# Patient Record
Sex: Female | Born: 1960 | Race: White | Hispanic: No | Marital: Married | Smoking: Former smoker
Health system: Southern US, Community
[De-identification: ages and names within clinical notes are randomized; demographics above are authoritative.]

## PROBLEM LIST (undated history)

## (undated) DIAGNOSIS — Z803 Family history of malignant neoplasm of breast: Secondary | ICD-10-CM

## (undated) HISTORY — PX: APPENDECTOMY: SHX54

## (undated) HISTORY — DX: Family history of malignant neoplasm of breast: Z80.3

---

## 2007-11-28 ENCOUNTER — Other Ambulatory Visit: Admission: RE | Admit: 2007-11-28 | Discharge: 2007-11-28 | Payer: Self-pay | Admitting: Family Medicine

## 2008-07-26 ENCOUNTER — Encounter: Admission: RE | Admit: 2008-07-26 | Discharge: 2008-07-26 | Payer: Self-pay | Admitting: Family Medicine

## 2010-08-22 ENCOUNTER — Other Ambulatory Visit: Payer: Self-pay | Admitting: Nurse Practitioner

## 2010-08-22 DIAGNOSIS — Z1231 Encounter for screening mammogram for malignant neoplasm of breast: Secondary | ICD-10-CM

## 2010-09-02 ENCOUNTER — Ambulatory Visit
Admission: RE | Admit: 2010-09-02 | Discharge: 2010-09-02 | Disposition: A | Discharge status: Home / Self Care | Payer: Private Health Insurance - Indemnity | Source: Ambulatory Visit | Attending: Nurse Practitioner | Admitting: Nurse Practitioner

## 2010-09-02 DIAGNOSIS — Z1231 Encounter for screening mammogram for malignant neoplasm of breast: Secondary | ICD-10-CM

## 2010-12-30 ENCOUNTER — Other Ambulatory Visit (HOSPITAL_COMMUNITY)
Admission: RE | Admit: 2010-12-30 | Discharge: 2010-12-30 | Disposition: A | Discharge status: Home / Self Care | Payer: Private Health Insurance - Indemnity | Source: Ambulatory Visit | Attending: Obstetrics and Gynecology | Admitting: Obstetrics and Gynecology

## 2010-12-30 DIAGNOSIS — Z01419 Encounter for gynecological examination (general) (routine) without abnormal findings: Secondary | ICD-10-CM | POA: Insufficient documentation

## 2011-12-16 ENCOUNTER — Other Ambulatory Visit: Payer: Self-pay | Admitting: Obstetrics and Gynecology

## 2011-12-16 DIAGNOSIS — Z1231 Encounter for screening mammogram for malignant neoplasm of breast: Secondary | ICD-10-CM

## 2012-01-26 ENCOUNTER — Ambulatory Visit
Admission: RE | Admit: 2012-01-26 | Discharge: 2012-01-26 | Disposition: A | Discharge status: Home / Self Care | Payer: Private Health Insurance - Indemnity | Source: Ambulatory Visit | Attending: Obstetrics and Gynecology | Admitting: Obstetrics and Gynecology

## 2012-01-26 DIAGNOSIS — Z1231 Encounter for screening mammogram for malignant neoplasm of breast: Secondary | ICD-10-CM

## 2012-03-03 ENCOUNTER — Other Ambulatory Visit: Payer: Self-pay | Admitting: Obstetrics and Gynecology

## 2012-03-03 ENCOUNTER — Other Ambulatory Visit (HOSPITAL_COMMUNITY)
Admission: RE | Admit: 2012-03-03 | Discharge: 2012-03-03 | Disposition: A | Discharge status: Home / Self Care | Payer: Private Health Insurance - Indemnity | Source: Ambulatory Visit | Attending: Obstetrics and Gynecology | Admitting: Obstetrics and Gynecology

## 2012-03-03 DIAGNOSIS — Z1151 Encounter for screening for human papillomavirus (HPV): Secondary | ICD-10-CM | POA: Insufficient documentation

## 2012-03-03 DIAGNOSIS — Z01419 Encounter for gynecological examination (general) (routine) without abnormal findings: Secondary | ICD-10-CM | POA: Insufficient documentation

## 2012-12-23 ENCOUNTER — Other Ambulatory Visit: Payer: Self-pay | Admitting: Gastroenterology

## 2012-12-23 DIAGNOSIS — R1013 Epigastric pain: Secondary | ICD-10-CM

## 2012-12-23 DIAGNOSIS — R110 Nausea: Secondary | ICD-10-CM

## 2013-01-12 ENCOUNTER — Encounter (HOSPITAL_COMMUNITY)
Admission: RE | Admit: 2013-01-12 | Discharge: 2013-01-12 | Disposition: A | Discharge status: Home / Self Care | Payer: Private Health Insurance - Indemnity | Source: Ambulatory Visit | Attending: Gastroenterology | Admitting: Gastroenterology

## 2013-01-12 ENCOUNTER — Ambulatory Visit (HOSPITAL_COMMUNITY)
Admission: RE | Admit: 2013-01-12 | Discharge: 2013-01-12 | Disposition: A | Discharge status: Home / Self Care | Payer: Private Health Insurance - Indemnity | Source: Ambulatory Visit | Attending: Gastroenterology | Admitting: Gastroenterology

## 2013-01-12 DIAGNOSIS — R1013 Epigastric pain: Secondary | ICD-10-CM | POA: Insufficient documentation

## 2013-01-12 DIAGNOSIS — R110 Nausea: Secondary | ICD-10-CM | POA: Insufficient documentation

## 2013-01-12 MED ORDER — TECHNETIUM TC 99M MEBROFENIN IV KIT
5.0000 | PACK | Freq: Once | INTRAVENOUS | Status: AC | PRN
  Administered 2013-01-12: 5 via INTRAVENOUS

## 2013-03-17 ENCOUNTER — Other Ambulatory Visit: Payer: Self-pay

## 2013-03-17 DIAGNOSIS — Z1231 Encounter for screening mammogram for malignant neoplasm of breast: Secondary | ICD-10-CM

## 2013-03-24 ENCOUNTER — Other Ambulatory Visit (HOSPITAL_COMMUNITY)
Admission: RE | Admit: 2013-03-24 | Discharge: 2013-03-24 | Disposition: A | Discharge status: Home / Self Care | Payer: Managed Care, Other (non HMO) | Source: Ambulatory Visit | Attending: Obstetrics and Gynecology | Admitting: Obstetrics and Gynecology

## 2013-03-24 ENCOUNTER — Other Ambulatory Visit: Payer: Self-pay | Admitting: Obstetrics and Gynecology

## 2013-03-24 DIAGNOSIS — Z01419 Encounter for gynecological examination (general) (routine) without abnormal findings: Secondary | ICD-10-CM | POA: Insufficient documentation

## 2013-03-31 ENCOUNTER — Ambulatory Visit
Admission: RE | Admit: 2013-03-31 | Discharge: 2013-03-31 | Disposition: A | Discharge status: Home / Self Care | Payer: Managed Care, Other (non HMO) | Source: Ambulatory Visit

## 2013-03-31 DIAGNOSIS — Z1231 Encounter for screening mammogram for malignant neoplasm of breast: Secondary | ICD-10-CM

## 2014-03-30 ENCOUNTER — Other Ambulatory Visit: Payer: Self-pay | Admitting: Obstetrics and Gynecology

## 2014-03-30 ENCOUNTER — Other Ambulatory Visit (HOSPITAL_COMMUNITY)
Admission: RE | Admit: 2014-03-30 | Discharge: 2014-03-30 | Disposition: A | Discharge status: Home / Self Care | Payer: Managed Care, Other (non HMO) | Source: Ambulatory Visit | Attending: Obstetrics and Gynecology | Admitting: Obstetrics and Gynecology

## 2014-03-30 DIAGNOSIS — Z01419 Encounter for gynecological examination (general) (routine) without abnormal findings: Secondary | ICD-10-CM | POA: Insufficient documentation

## 2014-04-03 LAB — CYTOLOGY - PAP

## 2014-04-17 ENCOUNTER — Ambulatory Visit: Payer: Managed Care, Other (non HMO) | Admitting: Family Medicine

## 2014-07-16 ENCOUNTER — Other Ambulatory Visit: Payer: Self-pay

## 2014-07-16 DIAGNOSIS — Z1231 Encounter for screening mammogram for malignant neoplasm of breast: Secondary | ICD-10-CM

## 2014-08-10 ENCOUNTER — Ambulatory Visit: Payer: Managed Care, Other (non HMO)

## 2014-08-15 ENCOUNTER — Ambulatory Visit
Admission: RE | Admit: 2014-08-15 | Discharge: 2014-08-15 | Disposition: A | Discharge status: Home / Self Care | Payer: Managed Care, Other (non HMO) | Source: Ambulatory Visit

## 2014-08-15 DIAGNOSIS — Z1231 Encounter for screening mammogram for malignant neoplasm of breast: Secondary | ICD-10-CM

## 2015-08-05 ENCOUNTER — Ambulatory Visit
Admission: RE | Admit: 2015-08-05 | Discharge: 2015-08-05 | Disposition: A | Discharge status: Home / Self Care | Payer: Managed Care, Other (non HMO) | Source: Ambulatory Visit | Attending: Nurse Practitioner | Admitting: Nurse Practitioner

## 2015-08-05 ENCOUNTER — Emergency Department (HOSPITAL_COMMUNITY): Payer: Managed Care, Other (non HMO) | Admitting: Anesthesiology

## 2015-08-05 ENCOUNTER — Other Ambulatory Visit: Payer: Self-pay | Admitting: Nurse Practitioner

## 2015-08-05 ENCOUNTER — Observation Stay (HOSPITAL_COMMUNITY)
Admission: EM | Admit: 2015-08-05 | Discharge: 2015-08-06 | Disposition: A | Discharge status: Home / Self Care | Payer: Managed Care, Other (non HMO) | Attending: General Surgery | Admitting: General Surgery

## 2015-08-05 ENCOUNTER — Encounter (HOSPITAL_COMMUNITY): Payer: Self-pay | Admitting: Emergency Medicine

## 2015-08-05 ENCOUNTER — Encounter (HOSPITAL_COMMUNITY)
Admission: EM | Disposition: A | Discharge status: Home / Self Care | Payer: Self-pay | Source: Home / Self Care | Attending: Emergency Medicine

## 2015-08-05 DIAGNOSIS — K353 Acute appendicitis with localized peritonitis, without perforation or gangrene: Secondary | ICD-10-CM | POA: Diagnosis present

## 2015-08-05 DIAGNOSIS — Z87891 Personal history of nicotine dependence: Secondary | ICD-10-CM | POA: Diagnosis not present

## 2015-08-05 DIAGNOSIS — K5900 Constipation, unspecified: Secondary | ICD-10-CM | POA: Diagnosis not present

## 2015-08-05 DIAGNOSIS — K3580 Unspecified acute appendicitis: Secondary | ICD-10-CM | POA: Diagnosis present

## 2015-08-05 DIAGNOSIS — R1031 Right lower quadrant pain: Secondary | ICD-10-CM

## 2015-08-05 HISTORY — PX: LAPAROSCOPIC APPENDECTOMY: SHX408

## 2015-08-05 LAB — COMPREHENSIVE METABOLIC PANEL
ALK PHOS: 108 U/L (ref 38–126)
ALT: 23 U/L (ref 14–54)
ANION GAP: 10 (ref 5–15)
AST: 30 U/L (ref 15–41)
Albumin: 3.9 g/dL (ref 3.5–5.0)
BUN: 10 mg/dL (ref 6–20)
CO2: 26 mmol/L (ref 22–32)
Calcium: 9.6 mg/dL (ref 8.9–10.3)
Chloride: 98 mmol/L — ABNORMAL LOW (ref 101–111)
Creatinine, Ser: 0.9 mg/dL (ref 0.44–1.00)
Glucose, Bld: 98 mg/dL (ref 65–99)
Potassium: 4 mmol/L (ref 3.5–5.1)
SODIUM: 134 mmol/L — AB (ref 135–145)
Total Bilirubin: 1.1 mg/dL (ref 0.3–1.2)
Total Protein: 8.3 g/dL — ABNORMAL HIGH (ref 6.5–8.1)

## 2015-08-05 LAB — CBC
HEMATOCRIT: 39.5 % (ref 36.0–46.0)
Hemoglobin: 13.3 g/dL (ref 12.0–15.0)
MCH: 30.6 pg (ref 26.0–34.0)
MCHC: 33.7 g/dL (ref 30.0–36.0)
MCV: 91 fL (ref 78.0–100.0)
PLATELETS: 188 10*3/uL (ref 150–400)
RBC: 4.34 MIL/uL (ref 3.87–5.11)
RDW: 14.1 % (ref 11.5–15.5)
WBC: 13 10*3/uL — AB (ref 4.0–10.5)

## 2015-08-05 LAB — URINALYSIS, ROUTINE W REFLEX MICROSCOPIC
Bilirubin Urine: NEGATIVE
GLUCOSE, UA: NEGATIVE mg/dL
Hgb urine dipstick: NEGATIVE
Ketones, ur: 15 mg/dL — AB
Nitrite: NEGATIVE
PH: 6.5 (ref 5.0–8.0)
PROTEIN: NEGATIVE mg/dL
Specific Gravity, Urine: >1.046 — ABNORMAL HIGH (ref 1.005–1.030)

## 2015-08-05 LAB — URINE MICROSCOPIC-ADD ON

## 2015-08-05 LAB — I-STAT BETA HCG BLOOD, ED (MC, WL, AP ONLY): I-stat hCG, quantitative: <5 m[IU]/mL (ref <5)

## 2015-08-05 LAB — LIPASE, BLOOD: LIPASE: 29 U/L (ref 11–51)

## 2015-08-05 SURGERY — APPENDECTOMY, LAPAROSCOPIC
Anesthesia: General | Site: Abdomen

## 2015-08-05 MED ORDER — DIPHENHYDRAMINE HCL 50 MG/ML IJ SOLN: 25.0000 mg | Freq: Four times a day (QID) | INTRAMUSCULAR | Status: DC | PRN

## 2015-08-05 MED ORDER — MORPHINE SULFATE (PF) 2 MG/ML IV SOLN: 2.0000 mg | INTRAVENOUS | Status: DC | PRN

## 2015-08-05 MED ORDER — ROCURONIUM BROMIDE 50 MG/5ML IV SOLN
INTRAVENOUS | Status: AC
  Filled 2015-08-05: qty 1

## 2015-08-05 MED ORDER — DEXAMETHASONE SODIUM PHOSPHATE 10 MG/ML IJ SOLN
INTRAMUSCULAR | Status: AC
  Filled 2015-08-05: qty 1

## 2015-08-05 MED ORDER — ENOXAPARIN SODIUM 40 MG/0.4ML ~~LOC~~ SOLN: 40.0000 mg | SUBCUTANEOUS | Status: DC

## 2015-08-05 MED ORDER — PHENYLEPHRINE 40 MCG/ML (10ML) SYRINGE FOR IV PUSH (FOR BLOOD PRESSURE SUPPORT)
PREFILLED_SYRINGE | INTRAVENOUS | Status: AC
  Filled 2015-08-05: qty 10

## 2015-08-05 MED ORDER — SODIUM CHLORIDE 0.9 % IR SOLN
Status: DC | PRN
  Administered 2015-08-05: 1000 mL

## 2015-08-05 MED ORDER — DEXAMETHASONE SODIUM PHOSPHATE 10 MG/ML IJ SOLN
INTRAMUSCULAR | Status: DC | PRN
  Administered 2015-08-05: 10 mg via INTRAVENOUS

## 2015-08-05 MED ORDER — ONDANSETRON HCL 4 MG/2ML IJ SOLN: 4.0000 mg | Freq: Four times a day (QID) | INTRAMUSCULAR | Status: DC | PRN

## 2015-08-05 MED ORDER — SODIUM CHLORIDE 0.9 % IV BOLUS (SEPSIS)
1000.0000 mL | Freq: Once | INTRAVENOUS | Status: AC
  Administered 2015-08-05: 1000 mL via INTRAVENOUS

## 2015-08-05 MED ORDER — PHENYLEPHRINE HCL 10 MG/ML IJ SOLN
INTRAMUSCULAR | Status: DC | PRN
  Administered 2015-08-05 (×3): 80 ug via INTRAVENOUS

## 2015-08-05 MED ORDER — SODIUM CHLORIDE 0.9 % IV SOLN
INTRAVENOUS | Status: DC
  Administered 2015-08-05: 23:00:00 via INTRAVENOUS

## 2015-08-05 MED ORDER — 0.9 % SODIUM CHLORIDE (POUR BTL) OPTIME
TOPICAL | Status: DC | PRN
  Administered 2015-08-05: 1000 mL

## 2015-08-05 MED ORDER — LIDOCAINE 2% (20 MG/ML) 5 ML SYRINGE
INTRAMUSCULAR | Status: AC
  Filled 2015-08-05: qty 5

## 2015-08-05 MED ORDER — KETOROLAC TROMETHAMINE 15 MG/ML IJ SOLN
15.0000 mg | Freq: Four times a day (QID) | INTRAMUSCULAR | Status: AC
  Administered 2015-08-06: 15 mg via INTRAVENOUS
  Filled 2015-08-05: qty 1

## 2015-08-05 MED ORDER — DIPHENHYDRAMINE HCL 25 MG PO CAPS: 25.0000 mg | ORAL_CAPSULE | Freq: Four times a day (QID) | ORAL | Status: DC | PRN

## 2015-08-05 MED ORDER — LACTATED RINGERS IV SOLN
INTRAVENOUS | Status: DC | PRN
  Administered 2015-08-05 (×2): via INTRAVENOUS

## 2015-08-05 MED ORDER — KETOROLAC TROMETHAMINE 15 MG/ML IJ SOLN: 15.0000 mg | Freq: Four times a day (QID) | INTRAMUSCULAR | Status: DC | PRN

## 2015-08-05 MED ORDER — HYDROCODONE-ACETAMINOPHEN 5-325 MG PO TABS
1.0000 | ORAL_TABLET | ORAL | Status: DC | PRN
  Administered 2015-08-06: 1 via ORAL
  Filled 2015-08-05: qty 1

## 2015-08-05 MED ORDER — ACETAMINOPHEN 325 MG PO TABS
650.0000 mg | ORAL_TABLET | Freq: Once | ORAL | Status: DC
  Filled 2015-08-05: qty 2

## 2015-08-05 MED ORDER — ONDANSETRON 4 MG PO TBDP: 4.0000 mg | ORAL_TABLET | Freq: Four times a day (QID) | ORAL | Status: DC | PRN

## 2015-08-05 MED ORDER — BUPIVACAINE-EPINEPHRINE (PF) 0.25% -1:200000 IJ SOLN
INTRAMUSCULAR | Status: AC
  Filled 2015-08-05: qty 30

## 2015-08-05 MED ORDER — ONDANSETRON HCL 4 MG/2ML IJ SOLN
INTRAMUSCULAR | Status: DC | PRN
  Administered 2015-08-05: 4 mg via INTRAVENOUS

## 2015-08-05 MED ORDER — ONDANSETRON HCL 4 MG/2ML IJ SOLN
INTRAMUSCULAR | Status: AC
  Filled 2015-08-05: qty 2

## 2015-08-05 MED ORDER — ROCURONIUM BROMIDE 100 MG/10ML IV SOLN
INTRAVENOUS | Status: DC | PRN
  Administered 2015-08-05: 30 mg via INTRAVENOUS

## 2015-08-05 MED ORDER — BUPIVACAINE-EPINEPHRINE 0.25% -1:200000 IJ SOLN
INTRAMUSCULAR | Status: DC | PRN
  Administered 2015-08-05: 20 mL

## 2015-08-05 MED ORDER — FENTANYL CITRATE (PF) 250 MCG/5ML IJ SOLN
INTRAMUSCULAR | Status: DC | PRN
  Administered 2015-08-05: 50 ug via INTRAVENOUS
  Administered 2015-08-05: 100 ug via INTRAVENOUS

## 2015-08-05 MED ORDER — SUCCINYLCHOLINE CHLORIDE 200 MG/10ML IV SOSY
PREFILLED_SYRINGE | INTRAVENOUS | Status: AC
  Filled 2015-08-05: qty 10

## 2015-08-05 MED ORDER — SUGAMMADEX SODIUM 500 MG/5ML IV SOLN
INTRAVENOUS | Status: DC | PRN
  Administered 2015-08-05: 239.6 mg via INTRAVENOUS

## 2015-08-05 MED ORDER — DEXTROSE 5 % IV SOLN
2.0000 g | INTRAVENOUS | Status: DC
  Administered 2015-08-05: 2 g via INTRAVENOUS
  Filled 2015-08-05 (×2): qty 2

## 2015-08-05 MED ORDER — IOPAMIDOL (ISOVUE-300) INJECTION 61%
100.0000 mL | Freq: Once | INTRAVENOUS | Status: AC | PRN
  Administered 2015-08-05: 100 mL via INTRAVENOUS

## 2015-08-05 MED ORDER — PROPOFOL 10 MG/ML IV BOLUS
INTRAVENOUS | Status: AC
  Filled 2015-08-05: qty 20

## 2015-08-05 MED ORDER — MIDAZOLAM HCL 2 MG/2ML IJ SOLN
INTRAMUSCULAR | Status: DC | PRN
  Administered 2015-08-05: 2 mg via INTRAVENOUS

## 2015-08-05 MED ORDER — HYDROMORPHONE HCL 1 MG/ML IJ SOLN: 0.2500 mg | INTRAMUSCULAR | Status: DC | PRN

## 2015-08-05 MED ORDER — MIDAZOLAM HCL 2 MG/2ML IJ SOLN
INTRAMUSCULAR | Status: AC
  Filled 2015-08-05: qty 2

## 2015-08-05 MED ORDER — FENTANYL CITRATE (PF) 250 MCG/5ML IJ SOLN
INTRAMUSCULAR | Status: AC
  Filled 2015-08-05: qty 5

## 2015-08-05 MED ORDER — SIMETHICONE 80 MG PO CHEW: 40.0000 mg | CHEWABLE_TABLET | Freq: Four times a day (QID) | ORAL | Status: DC | PRN

## 2015-08-05 SURGICAL SUPPLY — 33 items
APPLIER CLIP 5 13 M/L LIGAMAX5 (MISCELLANEOUS)
CANISTER SUCTION 2500CC (MISCELLANEOUS) ×3 IMPLANT
CHLORAPREP W/TINT 26ML (MISCELLANEOUS) ×3 IMPLANT
CLIP APPLIE 5 13 M/L LIGAMAX5 (MISCELLANEOUS) IMPLANT
COVER SURGICAL LIGHT HANDLE (MISCELLANEOUS) ×3 IMPLANT
ELECT REM PT RETURN 9FT ADLT (ELECTROSURGICAL) ×3
ELECTRODE REM PT RTRN 9FT ADLT (ELECTROSURGICAL) ×1 IMPLANT
ENDOLOOP SUT PDS II  0 18 (SUTURE) ×4
ENDOLOOP SUT PDS II 0 18 (SUTURE) ×2 IMPLANT
GLOVE BIOGEL PI IND STRL 7.0 (GLOVE) ×1 IMPLANT
GLOVE BIOGEL PI INDICATOR 7.0 (GLOVE) ×2
GLOVE SURG SS PI 7.0 STRL IVOR (GLOVE) ×3 IMPLANT
GOWN STRL REUS W/ TWL LRG LVL3 (GOWN DISPOSABLE) ×3 IMPLANT
GOWN STRL REUS W/TWL LRG LVL3 (GOWN DISPOSABLE) ×6
KIT BASIN OR (CUSTOM PROCEDURE TRAY) ×3 IMPLANT
KIT ROOM TURNOVER OR (KITS) ×3 IMPLANT
LIQUID BAND (GAUZE/BANDAGES/DRESSINGS) ×3 IMPLANT
NS IRRIG 1000ML POUR BTL (IV SOLUTION) ×3 IMPLANT
PAD ARMBOARD 7.5X6 YLW CONV (MISCELLANEOUS) ×6 IMPLANT
POUCH RETRIEVAL ECOSAC 10 (ENDOMECHANICALS) ×1 IMPLANT
POUCH RETRIEVAL ECOSAC 10MM (ENDOMECHANICALS) ×2
SCISSORS LAP 5X35 DISP (ENDOMECHANICALS) ×3 IMPLANT
SET IRRIG TUBING LAPAROSCOPIC (IRRIGATION / IRRIGATOR) ×3 IMPLANT
SLEEVE ENDOPATH XCEL 5M (ENDOMECHANICALS) ×3 IMPLANT
SPECIMEN JAR SMALL (MISCELLANEOUS) ×3 IMPLANT
SUT MNCRL AB 4-0 PS2 18 (SUTURE) ×3 IMPLANT
TOWEL OR 17X24 6PK STRL BLUE (TOWEL DISPOSABLE) ×3 IMPLANT
TOWEL OR 17X26 10 PK STRL BLUE (TOWEL DISPOSABLE) ×3 IMPLANT
TRAY FOLEY CATH 16FR SILVER (SET/KITS/TRAYS/PACK) IMPLANT
TRAY LAPAROSCOPIC MC (CUSTOM PROCEDURE TRAY) ×3 IMPLANT
TROCAR XCEL NON-BLD 11X100MML (ENDOMECHANICALS) ×3 IMPLANT
TROCAR XCEL NON-BLD 5MMX100MML (ENDOMECHANICALS) ×3 IMPLANT
TUBING INSUFFLATION (TUBING) ×3 IMPLANT

## 2015-08-05 NOTE — H&P (Signed)
Heather Hebert is an 55 y.o. female.   Chief Complaint: abdominal pain HPI: 55 yo female with 3 day history of abdominal pain. It started periumbilical then moved to her right side this morning. She has anorexia and nausea, no vomiting. She thought she would have diarrhea, but is constipated. She has had no previous similar episodes. She had a low grade fever.  No past medical history on file.  Past Surgical History  Procedure Laterality Date  . Cesarean section      No family history on file. Social History:  reports that she has quit smoking. She does not have any smokeless tobacco history on file. She reports that she drinks alcohol. She reports that she does not use illicit drugs.  Allergies: Not on File   (Not in a hospital admission)  Results for orders placed or performed during the hospital encounter of 08/05/15 (from the past 48 hour(s))  Lipase, blood     Status: None   Collection Time: 08/05/15  4:57 PM  Result Value Ref Range   Lipase 29 11 - 51 U/L  Comprehensive metabolic panel     Status: Abnormal   Collection Time: 08/05/15  4:57 PM  Result Value Ref Range   Sodium 134 (L) 135 - 145 mmol/L   Potassium 4.0 3.5 - 5.1 mmol/L   Chloride 98 (L) 101 - 111 mmol/L   CO2 26 22 - 32 mmol/L   Glucose, Bld 98 65 - 99 mg/dL   BUN 10 6 - 20 mg/dL   Creatinine, Ser 0.90 0.44 - 1.00 mg/dL   Calcium 9.6 8.9 - 10.3 mg/dL   Total Protein 8.3 (H) 6.5 - 8.1 g/dL   Albumin 3.9 3.5 - 5.0 g/dL   AST 30 15 - 41 U/L   ALT 23 14 - 54 U/L   Alkaline Phosphatase 108 38 - 126 U/L   Total Bilirubin 1.1 0.3 - 1.2 mg/dL   GFR calc non Af Amer >60 >60 mL/min   GFR calc Af Amer >60 >60 mL/min    Comment: (NOTE) The eGFR has been calculated using the CKD EPI equation. This calculation has not been validated in all clinical situations. eGFR's persistently <60 mL/min signify possible Chronic Kidney Disease.    Anion gap 10 5 - 15  CBC     Status: Abnormal   Collection Time: 08/05/15   4:57 PM  Result Value Ref Range   WBC 13.0 (H) 4.0 - 10.5 K/uL   RBC 4.34 3.87 - 5.11 MIL/uL   Hemoglobin 13.3 12.0 - 15.0 g/dL   HCT 39.5 36.0 - 46.0 %   MCV 91.0 78.0 - 100.0 fL   MCH 30.6 26.0 - 34.0 pg   MCHC 33.7 30.0 - 36.0 g/dL   RDW 14.1 11.5 - 15.5 %   Platelets 188 150 - 400 K/uL  Urinalysis, Routine w reflex microscopic     Status: Abnormal   Collection Time: 08/05/15  5:08 PM  Result Value Ref Range   Color, Urine YELLOW YELLOW   APPearance CLEAR CLEAR   Specific Gravity, Urine >1.046 (H) 1.005 - 1.030   pH 6.5 5.0 - 8.0   Glucose, UA NEGATIVE NEGATIVE mg/dL   Hgb urine dipstick NEGATIVE NEGATIVE   Bilirubin Urine NEGATIVE NEGATIVE   Ketones, ur 15 (A) NEGATIVE mg/dL   Protein, ur NEGATIVE NEGATIVE mg/dL   Nitrite NEGATIVE NEGATIVE   Leukocytes, UA MODERATE (A) NEGATIVE  Urine microscopic-add on     Status: Abnormal   Collection  Time: 08/05/15  5:08 PM  Result Value Ref Range   Squamous Epithelial / LPF 0-5 (A) NONE SEEN   WBC, UA 6-30 0 - 5 WBC/hpf   RBC / HPF 0-5 0 - 5 RBC/hpf   Bacteria, UA FEW (A) NONE SEEN  I-Stat beta hCG blood, ED     Status: None   Collection Time: 08/05/15  5:12 PM  Result Value Ref Range   I-stat hCG, quantitative <5.0 <5 mIU/mL   Comment 3            Comment:   GEST. AGE      CONC.  (mIU/mL)   <=1 WEEK        5 - 50     2 WEEKS       50 - 500     3 WEEKS       100 - 10,000     4 WEEKS     1,000 - 30,000        FEMALE AND NON-PREGNANT FEMALE:     LESS THAN 5 mIU/mL    Ct Abdomen Pelvis W Contrast  08/05/2015  CLINICAL DATA:  Right lower quadrant pain 3 days EXAM: CT ABDOMEN AND PELVIS WITH CONTRAST TECHNIQUE: Multidetector CT imaging of the abdomen and pelvis was performed using the standard protocol following bolus administration of intravenous contrast. CONTRAST:  11m ISOVUE-300 IOPAMIDOL (ISOVUE-300) INJECTION 61% COMPARISON:  Ultrasound abdomen 01/12/2013 FINDINGS: Lower chest:  Lung bases clear Hepatobiliary: Focal fatty  infiltration of the liver adjacent the falciform ligament. No liver mass lesion. Gallbladder and bile ducts normal. Pancreas: Negative Spleen: Negative Adrenals/Urinary Tract: Normal kidneys. No renal mass or obstruction. No urinary tract calculi. Urinary bladder empty and normal. Stomach/Bowel: Inflammatory process in the right lower quadrant anteriorly. There is a thickened loop of bowel which shows increased enhancement and measuring 9 mm in diameter. This is consistent with acute appendicitis. There is inflammatory edema in the surrounding fat. No abscess identified. The cecum appears inflamed and thickened and is positioned anteriorly just under the anterior abdominal wall. Vascular/Lymphatic: Minimal atherosclerotic calcification in the aorta. No aneurysm. No lymphadenopathy Reproductive: Normal uterus.  No pelvic mass. Other: Minimal free fluid in the right lower pelvis. No abscess. No free air. Musculoskeletal: Lumbar disc and facet degeneration at L5-S1. Mild facet degeneration on the left at L4-5. No focal bony lesion or fracture. IMPRESSION: Acute appendicitis. Minimal amount of free fluid in the pelvis. No abscess. The cecum is thickened and positioned anteriorly just under the anterior abdominal wall. These results will be called to the ordering clinician or representative by the Radiologist Assistant, and communication documented in the PACS or zVision Dashboard. Electronically Signed   By: CFranchot GalloM.D.   On: 08/05/2015 14:59    Review of Systems  Constitutional: Positive for fever. Negative for chills.  HENT: Negative for hearing loss.   Eyes: Negative for blurred vision and double vision.  Respiratory: Negative for cough and hemoptysis.   Cardiovascular: Negative for chest pain and palpitations.  Gastrointestinal: Positive for nausea and abdominal pain. Negative for vomiting.  Genitourinary: Negative for dysuria and urgency.  Musculoskeletal: Negative for myalgias and neck pain.   Skin: Negative for itching and rash.  Neurological: Negative for dizziness, tingling and headaches.  Endo/Heme/Allergies: Does not bruise/bleed easily.  Psychiatric/Behavioral: Negative for depression and suicidal ideas.    Blood pressure 139/89, pulse 111, temperature 99.1 F (37.3 C), temperature source Oral, resp. rate 18, height '5\' 2"'  (1.575 m), weight 59.875  kg (132 lb), SpO2 100 %. Physical Exam  Vitals reviewed. Constitutional: She is oriented to person, place, and time. She appears well-developed and well-nourished.  HENT:  Head: Normocephalic and atraumatic.  Eyes: Conjunctivae and EOM are normal. Pupils are equal, round, and reactive to light.  Neck: Normal range of motion. Neck supple.  Cardiovascular: Normal rate and regular rhythm.   Respiratory: Effort normal and breath sounds normal.  GI: Soft. Bowel sounds are normal. She exhibits no distension. There is tenderness in the right lower quadrant. There is guarding and tenderness at McBurney's point.  Musculoskeletal: Normal range of motion.  Neurological: She is alert and oriented to person, place, and time.  Skin: Skin is warm and dry.  Psychiatric: She has a normal mood and affect. Her behavior is normal.     Assessment/Plan 55 yo female with acute appendicitis. We discussed the etiology of appendicitis and plan for removal. We discussed details of the surgery, that it is generally performed laparoscopically, with general anesthesia. We discussed risks of infection, injury to intestines, bleeding risk. She showed good understanding and wished to proceed. -lap appy -IV abx -pain control -observation  Mickeal Skinner, MD 08/05/2015, 5:43 PM

## 2015-08-05 NOTE — Op Note (Signed)
Preoperative diagnosis: acute appendicitis with peritonitis  Postoperative diagnosis: Same   Procedure: laparoscopic appendectomy  Surgeon: Gurney Maxin, M.D.  Asst: none  Anesthesia: Gen.   Indications for procedure: Heather Hebert is a 55 y.o. female with symptoms of constipation, pain in right lower quadrant and nausea consistent with acute appendicitis. Confirmed by CT scan and laboratory values.  Description of procedure: The patient was brought into the operative suite, placed supine. Anesthesia was administered with endotracheal tube. The patient's left arm was tucked. All pressure points were offloaded by foam padding. The patient was prepped and draped in the usual sterile fashion.  A transverse incision was made to the left of the umbilicus and a 38mm optical entry trocar was used to gain access to that abdominal cavity.  Pneumoperitoneum was applied with high flow low pressure.  1 19mm trocar were placed in the suprapubic space and 1 18mm trocar in the LLQ.Marland Kitchen Pneumoperitoneum was applied with high flow low pressure.  2 30mm trocars were placed, one in the suprapubic space, one in the LLQ. All trocars sites were first anesthesized with 0.25% marcaine with epinephrine. Next the patient was placed in trendelenberg, rotated to the left. The omentum was retracted cephalad. The cecum and appendix were identified. The appendix had fibrinous inflammatory tissue overlying it. The base of the appendix was dissected and a window through the mesoappendix was created with blunt dissection. Cautery was used to dissect the mesoappendix away and 2 0 PDS endoloops were placed at the base of the appendix. The appendix was then cut with scissors.  The appendix was placed in a specimen bag. The pelvis and RLQ were irrigated. No purulence was seen The appendix was removed via the 90mm trocar. Pneumoperitoneum was removed, all trocars were removed. All incisions were closed with 4-0 monocryl subcuticular  stitch. The patient woke from anesthesia and was brought to PACU in stable condition.  Findings: acute appendicitis  Specimen: appendix  Blood loss: 50 ml  Local anesthesia: 20 ml 0.25% marcaine with epinephrine  Complications: none  Gurney Maxin, M.D. General, Bariatric, & Minimally Invasive Surgery [facility]

## 2015-08-05 NOTE — Anesthesia Postprocedure Evaluation (Signed)
Anesthesia Post Note  Patient: Heather Hebert  Procedure(s) Performed: Procedure(s) (LRB): APPENDECTOMY LAPAROSCOPIC (N/A)  Patient location during evaluation: PACU Anesthesia Type: General Level of consciousness: awake and alert Pain management: pain level controlled Vital Signs Assessment: post-procedure vital signs reviewed and stable Respiratory status: spontaneous breathing, nonlabored ventilation and respiratory function stable Cardiovascular status: blood pressure returned to baseline and stable Postop Assessment: no signs of nausea or vomiting Anesthetic complications: no    Last Vitals:  Filed Vitals:   08/05/15 2200 08/05/15 2215  BP: 111/56 107/60  Pulse: 77 76  Temp:    Resp: 12 11    Last Pain:  Filed Vitals:   08/05/15 2215  PainSc: Asleep                 Ryett Hamman,W. EDMOND

## 2015-08-05 NOTE — Progress Notes (Signed)
Report given to [facility], RN

## 2015-08-05 NOTE — ED Provider Notes (Signed)
CSN: [id number]     Arrival date & time 08/05/15  1535 History   First MD Initiated Contact with Patient 08/05/15 1713     Chief Complaint  Patient presents with  . appendicitis      (Consider location/radiation/quality/duration/timing/severity/associated sxs/prior Treatment) HPI  55 year old female presents with right lower quadrant abdominal pain. Has been having abdominal pain for the past 3 days. It has been progressively worsening. At first she thought she just need to go to the bathroom or throw up. She has not had any vomiting but has had on and off nausea. No diarrhea. No urinary symptoms. Has had a low-grade fever, maximum temperature of 100.2. Went to her PCP today and had a CT scan which has resulted in shows acute appendicitis. Patient last ate last night and last regular little bit of water this morning. Rates her pain as a 7/10, currently declines pain medicine except for maybe Tylenol for headache.  No past medical history on file. Past Surgical History  Procedure Laterality Date  . Cesarean section     No family history on file. Social History  Substance Use Topics  . Smoking status: Former Research scientist (life sciences)  . Smokeless tobacco: Not on file  . Alcohol Use: Yes     Comment: "once a month"   OB History    No data available     Review of Systems  Constitutional: Positive for fever.  Gastrointestinal: Positive for nausea and abdominal pain. Negative for vomiting and diarrhea.  Genitourinary: Negative for dysuria.  All other systems reviewed and are negative.     Allergies  Review of patient's allergies indicates not on file.  Home Medications   Prior to Admission medications   Not on File   BP 139/89 mmHg  Pulse 111  Temp(Src) 99.1 F (37.3 C) (Oral)  Resp 18  Ht 5\' 2"  (1.575 m)  Wt 132 lb (59.875 kg)  BMI 24.14 kg/m2  SpO2 100% Physical Exam  Constitutional: She is oriented to person, place, and time. She appears well-developed and well-nourished.  HENT:   Head: Normocephalic and atraumatic.  Right Ear: External ear normal.  Left Ear: External ear normal.  Nose: Nose normal.  Eyes: Right eye exhibits no discharge. Left eye exhibits no discharge.  Cardiovascular: Normal rate, regular rhythm and normal heart sounds.   Pulmonary/Chest: Effort normal and breath sounds normal.  Abdominal: Soft. Normal appearance. She exhibits no distension. There is tenderness in the right lower quadrant.  + rovsing sign. Guarding in lower abdomen  Neurological: She is alert and oriented to person, place, and time.  Skin: Skin is warm and dry.  Nursing note and vitals reviewed.   ED Course  Procedures (including critical care time) Labs Review Labs Reviewed  CBC - Abnormal; Notable for the following:    WBC 13.0 (*)    All other components within normal limits  LIPASE, BLOOD  COMPREHENSIVE METABOLIC PANEL  URINALYSIS, ROUTINE W REFLEX MICROSCOPIC (NOT AT [facility])  I-STAT BETA HCG BLOOD, ED ([facility], [facility], [facility] ONLY)    Imaging Review Ct Abdomen Pelvis W Contrast  08/05/2015  CLINICAL DATA:  Right lower quadrant pain 3 days EXAM: CT ABDOMEN AND PELVIS WITH CONTRAST TECHNIQUE: Multidetector CT imaging of the abdomen and pelvis was performed using the standard protocol following bolus administration of intravenous contrast. CONTRAST:  12mL ISOVUE-300 IOPAMIDOL (ISOVUE-300) INJECTION 61% COMPARISON:  Ultrasound abdomen 01/12/2013 FINDINGS: Lower chest:  Lung bases clear Hepatobiliary: Focal fatty infiltration of the liver adjacent the falciform ligament. No  liver mass lesion. Gallbladder and bile ducts normal. Pancreas: Negative Spleen: Negative Adrenals/Urinary Tract: Normal kidneys. No renal mass or obstruction. No urinary tract calculi. Urinary bladder empty and normal. Stomach/Bowel: Inflammatory process in the right lower quadrant anteriorly. There is a thickened loop of bowel which shows increased enhancement and measuring 9 mm in diameter. This is consistent with  acute appendicitis. There is inflammatory edema in the surrounding fat. No abscess identified. The cecum appears inflamed and thickened and is positioned anteriorly just under the anterior abdominal wall. Vascular/Lymphatic: Minimal atherosclerotic calcification in the aorta. No aneurysm. No lymphadenopathy Reproductive: Normal uterus.  No pelvic mass. Other: Minimal free fluid in the right lower pelvis. No abscess. No free air. Musculoskeletal: Lumbar disc and facet degeneration at L5-S1. Mild facet degeneration on the left at L4-5. No focal bony lesion or fracture. IMPRESSION: Acute appendicitis. Minimal amount of free fluid in the pelvis. No abscess. The cecum is thickened and positioned anteriorly just under the anterior abdominal wall. These results will be called to the ordering clinician or representative by the Radiologist Assistant, and communication documented in the PACS or zVision Dashboard. Electronically Signed   By: Franchot Gallo M.D.   On: 08/05/2015 14:59   I have personally reviewed and evaluated these images and lab results as part of my medical decision-making.   EKG Interpretation None      MDM   Final diagnoses:  Acute appendicitis, uncomplicated    CT shows acute appendicitis, no significant complication. Kept NPO, she declines pain or nausea meds besides tylenol. Given IVF and surgery consulted. Overall healthy. D/w Dr. Kieth Brightly who will admit.    Sherwood Gambler, MD 08/05/15 1729

## 2015-08-05 NOTE — Transfer of Care (Signed)
Immediate Anesthesia Transfer of Care Note  Patient: Heather Hebert  Procedure(s) Performed: Procedure(s): APPENDECTOMY LAPAROSCOPIC (N/A)  Patient Location: PACU  Anesthesia Type:General  Level of Consciousness: awake  Airway & Oxygen Therapy: Patient Spontanous Breathing  Post-op Assessment: Report given to RN and Post -op Vital signs reviewed and stable  Post vital signs: Reviewed and stable  Last Vitals:  Filed Vitals:   08/05/15 1800 08/05/15 2122  BP: 128/73 118/61  Pulse: 95 88  Temp:  36.3 C  Resp: 16 16    Last Pain:  Filed Vitals:   08/05/15 2123  PainSc: Asleep         Complications: No apparent anesthesia complications

## 2015-08-05 NOTE — ED Notes (Signed)
Surgeon at bedside.  

## 2015-08-05 NOTE — Anesthesia Procedure Notes (Signed)
Procedure Name: Intubation Date/Time: 08/05/2015 8:21 PM Performed by: Purvis Kilts Pre-anesthesia Checklist: Patient identified, Emergency Drugs available, Suction available, Patient being monitored and Timeout performed Patient Re-evaluated:Patient Re-evaluated prior to inductionOxygen Delivery Method: Circle system utilized Preoxygenation: Pre-oxygenation with 100% oxygen Intubation Type: IV induction Ventilation: Mask ventilation without difficulty Grade View: Grade I Tube type: Oral Tube size: 7.0 mm Number of attempts: 1 Placement Confirmation: ETT inserted through vocal cords under direct vision,  positive ETCO2 and breath sounds checked- equal and bilateral Secured at: 21 cm Tube secured with: Tape Dental Injury: Teeth and Oropharynx as per pre-operative assessment

## 2015-08-05 NOTE — ED Notes (Signed)
Pt presents to ED after having a CT scan with her PCP (available in Epic) which showed acute appendicitis.  Pt stable at this time.

## 2015-08-05 NOTE — Anesthesia Preprocedure Evaluation (Signed)
Anesthesia Evaluation  Patient identified by MRN, date of birth, ID band Patient awake    Reviewed: Allergy & Precautions, H&P , NPO status , Patient's Chart, lab work & pertinent test results  Airway Mallampati: II  TM Distance: >3 FB Neck ROM: Full    Dental no notable dental hx. (+) Teeth Intact, Dental Advisory Given   Pulmonary neg pulmonary ROS, former smoker,    Pulmonary exam normal breath sounds clear to auscultation       Cardiovascular negative cardio ROS   Rhythm:Regular Rate:Normal     Neuro/Psych negative neurological ROS  negative psych ROS   GI/Hepatic negative GI ROS, Neg liver ROS,   Endo/Other  negative endocrine ROS  Renal/GU negative Renal ROS  negative genitourinary   Musculoskeletal   Abdominal   Peds  Hematology negative hematology ROS (+)   Anesthesia Other Findings   Reproductive/Obstetrics negative OB ROS                             Anesthesia Physical Anesthesia Plan  ASA: I and emergent  Anesthesia Plan: General   Post-op Pain Management:    Induction: Intravenous, Rapid sequence and Cricoid pressure planned  Airway Management Planned: Oral ETT  Additional Equipment:   Intra-op Plan:   Post-operative Plan: Extubation in OR  Informed Consent: I have reviewed the patients History and Physical, chart, labs and discussed the procedure including the risks, benefits and alternatives for the proposed anesthesia with the patient or authorized representative who has indicated his/her understanding and acceptance.   Dental advisory given  Plan Discussed with: CRNA  Anesthesia Plan Comments:         Anesthesia Quick Evaluation

## 2015-08-06 ENCOUNTER — Encounter (HOSPITAL_COMMUNITY): Payer: Self-pay | Admitting: General Surgery

## 2015-08-06 LAB — CBC
HEMATOCRIT: 34.7 % — AB (ref 36.0–46.0)
Hemoglobin: 11.1 g/dL — ABNORMAL LOW (ref 12.0–15.0)
MCH: 29.2 pg (ref 26.0–34.0)
MCHC: 32 g/dL (ref 30.0–36.0)
MCV: 91.3 fL (ref 78.0–100.0)
Platelets: 167 10*3/uL (ref 150–400)
RBC: 3.8 MIL/uL — AB (ref 3.87–5.11)
RDW: 14.2 % (ref 11.5–15.5)
WBC: 9.4 10*3/uL (ref 4.0–10.5)

## 2015-08-06 LAB — GLUCOSE, CAPILLARY: Glucose-Capillary: 141 mg/dL — ABNORMAL HIGH (ref 65–99)

## 2015-08-06 MED ORDER — HYDROCODONE-ACETAMINOPHEN 5-325 MG PO TABS: 1.0000 | ORAL_TABLET | Freq: Four times a day (QID) | ORAL | Status: DC | PRN

## 2015-08-06 NOTE — Discharge Summary (Signed)
Physician Discharge Summary  Patient ID: Heather Hebert MRN: [id number] DOB/AGE: October 08, 1960 55 y.o.  Admit date: 08/05/2015 Discharge date: 08/06/2015  Admitting Diagnosis: Acute appendicitis   Discharge Diagnosis Patient Active Problem List   Diagnosis Date Noted  . Acute appendicitis with localized peritonitis 08/05/2015    Consultants none  Imaging: Ct Abdomen Pelvis W Contrast  08/05/2015  CLINICAL DATA:  Right lower quadrant pain 3 days EXAM: CT ABDOMEN AND PELVIS WITH CONTRAST TECHNIQUE: Multidetector CT imaging of the abdomen and pelvis was performed using the standard protocol following bolus administration of intravenous contrast. CONTRAST:  14mL ISOVUE-300 IOPAMIDOL (ISOVUE-300) INJECTION 61% COMPARISON:  Ultrasound abdomen 01/12/2013 FINDINGS: Lower chest:  Lung bases clear Hepatobiliary: Focal fatty infiltration of the liver adjacent the falciform ligament. No liver mass lesion. Gallbladder and bile ducts normal. Pancreas: Negative Spleen: Negative Adrenals/Urinary Tract: Normal kidneys. No renal mass or obstruction. No urinary tract calculi. Urinary bladder empty and normal. Stomach/Bowel: Inflammatory process in the right lower quadrant anteriorly. There is a thickened loop of bowel which shows increased enhancement and measuring 9 mm in diameter. This is consistent with acute appendicitis. There is inflammatory edema in the surrounding fat. No abscess identified. The cecum appears inflamed and thickened and is positioned anteriorly just under the anterior abdominal wall. Vascular/Lymphatic: Minimal atherosclerotic calcification in the aorta. No aneurysm. No lymphadenopathy Reproductive: Normal uterus.  No pelvic mass. Other: Minimal free fluid in the right lower pelvis. No abscess. No free air. Musculoskeletal: Lumbar disc and facet degeneration at L5-S1. Mild facet degeneration on the left at L4-5. No focal bony lesion or fracture. IMPRESSION: Acute appendicitis. Minimal amount  of free fluid in the pelvis. No abscess. The cecum is thickened and positioned anteriorly just under the anterior abdominal wall. These results will be called to the ordering clinician or representative by the Radiologist Assistant, and communication documented in the PACS or zVision Dashboard. Electronically Signed   By: Franchot Gallo M.D.   On: 08/05/2015 14:59    Procedures Laparoscopic appendectomy---Dr. Carlus Pavlov Course:  Heather Hebert is a 55 year old female who [facility] with abdominal pain and a low grade fever.  Workup showed acute appendicitis on CT.  Patient was admitted and underwent procedure listed above.  Tolerated procedure well and was transferred to the floor.  Diet was advanced as tolerated.  On POD#1, the patient was voiding well, tolerating diet, ambulating well, pain well controlled, vital signs stable, incisions c/d/i and felt stable for discharge home.  Medication risks, benefits and therapeutic alternatives were reviewed with the patient.  She verbalizes understanding.  Patient will follow up in our office in 3 weeks and knows to call with questions or concerns.  Physical Exam: General:  Alert, NAD, pleasant, comfortable Abd:  Soft, ND, mild tenderness, incisions C/D/I    Medication List    TAKE these medications        HYDROcodone-acetaminophen 5-325 MG tablet  Commonly known as:  NORCO/VICODIN  Take 1-2 tablets by mouth every 6 (six) hours as needed for moderate pain.     multivitamin with minerals Tabs tablet  Take 1 tablet by mouth daily.     PATADAY OP  Place 1 drop into both eyes daily as needed. For dry eyes     PROBIOTIC PO  Take 1 tablet by mouth daily.     VITAMIN D PO  Take 1 tablet by mouth daily.             Follow-up Information  Follow up with [facility] On 08/21/2015.   Specialty:  General Surgery   Why:  arrive by 9:45AM for a 10:15AM post operative check up   Contact information:   [address] [place] [address] [phone]       Signed: Erby Pian, [facility] [phone]  08/06/2015, 8:52 AM

## 2015-08-06 NOTE — Progress Notes (Signed)
Heather Hebert to be D/C'd Home per MD order. Discussed with the patient and all questions fully answered.    Medication List    TAKE these medications        HYDROcodone-acetaminophen 5-325 MG tablet  Commonly known as:  NORCO/VICODIN  Take 1-2 tablets by mouth every 6 (six) hours as needed for moderate pain.     multivitamin with minerals Tabs tablet  Take 1 tablet by mouth daily.     PATADAY OP  Place 1 drop into both eyes daily as needed. For dry eyes     PROBIOTIC PO  Take 1 tablet by mouth daily.     VITAMIN D PO  Take 1 tablet by mouth daily.        VVS, Skin clean, dry and intact without evidence of skin break down, no evidence of skin tears noted.  IV catheter discontinued intact. Site without signs and symptoms of complications. Dressing and pressure applied.  An After Visit Summary was printed and given to the patient.  Patient escorted via [place], and D/C home via private auto.  Audria Nine F  08/06/2015 2:58 PM

## 2015-08-06 NOTE — Discharge Instructions (Signed)

## 2015-10-28 ENCOUNTER — Other Ambulatory Visit: Payer: Self-pay | Admitting: Family Medicine

## 2015-10-28 DIAGNOSIS — Z1231 Encounter for screening mammogram for malignant neoplasm of breast: Secondary | ICD-10-CM

## 2015-11-04 ENCOUNTER — Ambulatory Visit
Admission: RE | Admit: 2015-11-04 | Discharge: 2015-11-04 | Disposition: A | Discharge status: Home / Self Care | Payer: Managed Care, Other (non HMO) | Source: Ambulatory Visit | Attending: Family Medicine | Admitting: Family Medicine

## 2015-11-04 DIAGNOSIS — Z1231 Encounter for screening mammogram for malignant neoplasm of breast: Secondary | ICD-10-CM

## 2016-12-30 ENCOUNTER — Other Ambulatory Visit: Payer: Self-pay | Admitting: Family Medicine

## 2016-12-30 DIAGNOSIS — Z139 Encounter for screening, unspecified: Secondary | ICD-10-CM

## 2017-01-29 ENCOUNTER — Ambulatory Visit: Payer: Managed Care, Other (non HMO)

## 2017-02-12 ENCOUNTER — Ambulatory Visit
Admission: RE | Admit: 2017-02-12 | Discharge: 2017-02-12 | Disposition: A | Discharge status: Home / Self Care | Payer: Commercial Managed Care - PPO | Source: Ambulatory Visit | Attending: Family Medicine | Admitting: Family Medicine

## 2017-02-12 DIAGNOSIS — Z139 Encounter for screening, unspecified: Secondary | ICD-10-CM

## 2018-01-04 ENCOUNTER — Other Ambulatory Visit: Payer: Self-pay | Admitting: Family Medicine

## 2018-01-04 DIAGNOSIS — Z1231 Encounter for screening mammogram for malignant neoplasm of breast: Secondary | ICD-10-CM

## 2018-02-09 DIAGNOSIS — C50919 Malignant neoplasm of unspecified site of unspecified female breast: Secondary | ICD-10-CM

## 2018-02-09 HISTORY — DX: Malignant neoplasm of unspecified site of unspecified female breast: C50.919

## 2018-02-23 ENCOUNTER — Ambulatory Visit: Payer: Commercial Managed Care - PPO

## 2018-03-25 ENCOUNTER — Ambulatory Visit
Admission: RE | Admit: 2018-03-25 | Discharge: 2018-03-25 | Disposition: A | Discharge status: Home / Self Care | Payer: Commercial Managed Care - PPO | Source: Ambulatory Visit | Attending: Family Medicine | Admitting: Family Medicine

## 2018-03-25 DIAGNOSIS — Z1231 Encounter for screening mammogram for malignant neoplasm of breast: Secondary | ICD-10-CM

## 2018-11-08 ENCOUNTER — Other Ambulatory Visit: Payer: Self-pay | Admitting: Obstetrics and Gynecology

## 2018-11-08 ENCOUNTER — Other Ambulatory Visit (HOSPITAL_COMMUNITY)
Admission: RE | Admit: 2018-11-08 | Discharge: 2018-11-08 | Disposition: A | Discharge status: Home / Self Care | Payer: Commercial Managed Care - PPO | Source: Ambulatory Visit | Attending: Obstetrics and Gynecology | Admitting: Obstetrics and Gynecology

## 2018-11-08 DIAGNOSIS — Z01419 Encounter for gynecological examination (general) (routine) without abnormal findings: Secondary | ICD-10-CM | POA: Diagnosis not present

## 2018-11-10 DIAGNOSIS — C801 Malignant (primary) neoplasm, unspecified: Secondary | ICD-10-CM

## 2018-11-10 HISTORY — DX: Malignant (primary) neoplasm, unspecified: C80.1

## 2018-11-14 LAB — CYTOLOGY - PAP
Diagnosis: NEGATIVE
High risk HPV: NEGATIVE

## 2018-11-23 ENCOUNTER — Other Ambulatory Visit: Payer: Self-pay | Admitting: Nurse Practitioner

## 2018-11-23 DIAGNOSIS — N6320 Unspecified lump in the left breast, unspecified quadrant: Secondary | ICD-10-CM

## 2018-12-02 ENCOUNTER — Other Ambulatory Visit: Payer: Self-pay | Admitting: Nurse Practitioner

## 2018-12-02 ENCOUNTER — Ambulatory Visit
Admission: RE | Admit: 2018-12-02 | Discharge: 2018-12-02 | Disposition: A | Discharge status: Home / Self Care | Payer: Commercial Managed Care - PPO | Source: Ambulatory Visit | Attending: Nurse Practitioner | Admitting: Nurse Practitioner

## 2018-12-02 ENCOUNTER — Other Ambulatory Visit: Payer: Commercial Managed Care - PPO

## 2018-12-02 ENCOUNTER — Other Ambulatory Visit: Payer: Self-pay

## 2018-12-02 DIAGNOSIS — N6320 Unspecified lump in the left breast, unspecified quadrant: Secondary | ICD-10-CM

## 2018-12-02 DIAGNOSIS — R599 Enlarged lymph nodes, unspecified: Secondary | ICD-10-CM

## 2018-12-05 ENCOUNTER — Ambulatory Visit
Admission: RE | Admit: 2018-12-05 | Discharge: 2018-12-05 | Disposition: A | Discharge status: Home / Self Care | Payer: Commercial Managed Care - PPO | Source: Ambulatory Visit | Attending: Nurse Practitioner | Admitting: Nurse Practitioner

## 2018-12-05 ENCOUNTER — Other Ambulatory Visit: Payer: Self-pay

## 2018-12-05 ENCOUNTER — Other Ambulatory Visit: Payer: Self-pay | Admitting: Nurse Practitioner

## 2018-12-05 DIAGNOSIS — N6320 Unspecified lump in the left breast, unspecified quadrant: Secondary | ICD-10-CM

## 2018-12-05 DIAGNOSIS — R599 Enlarged lymph nodes, unspecified: Secondary | ICD-10-CM

## 2018-12-07 ENCOUNTER — Encounter: Payer: Self-pay | Admitting: *Deleted

## 2018-12-07 DIAGNOSIS — C50512 Malignant neoplasm of lower-outer quadrant of left female breast: Secondary | ICD-10-CM

## 2018-12-07 DIAGNOSIS — Z171 Estrogen receptor negative status [ER-]: Secondary | ICD-10-CM | POA: Insufficient documentation

## 2018-12-13 NOTE — Progress Notes (Signed)
[facility] CONSULT NOTE  Patient Care Team: [name], FNP as PCP - General (Family Medicine) Mauro Kaufmann, RN as Oncology Nurse Navigator Rockwell Germany, RN as Oncology Nurse Navigator  CHIEF COMPLAINTS/PURPOSE OF CONSULTATION:  Newly diagnosed triple negative left breast cancer  HISTORY OF PRESENTING ILLNESS:  Heather Hebert 58 y.o. female is here because of recent diagnosis of triple negative invasive ductal carcinoma of the left breast. The cancer was detected on a diagnostic mammogram on 12/02/18 after the patient palpated a lump in the left breast. It showed a 1.4cm mass at the 5 o'clock position in the left breast, a left axillary lymph node measuring 2.8cm, and 2 smaller axillary lymph nodes with cortical thickening. Biopsy on 12/05/18 showed invasive ductal carcinoma, grade 3, HER-2 negative (0), ER/PR negative, Ki67 80% in the breast and lymph node. She presents to the clinic today for initial evaluation and discussion of treatment options.   I reviewed her records extensively and collaborated the history with the patient.  SUMMARY OF ONCOLOGIC HISTORY: Oncology History  Malignant neoplasm of lower-outer quadrant of left breast of female, estrogen receptor negative ([place])  12/07/2018 Initial Diagnosis   Patient palpated a lump in the left breast. Mammogram showed a 1.4cm mass at the 5 o'clock position in the left breast, a left axillary lymph node measuring 2.8cm, and 2 smaller axillary lymph nodes with cortical thickening. 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   12/14/2018 Cancer Staging   Staging form: Breast, AJCC 8th Edition - Clinical: Stage IIB (cT1c, cN1, cM0, G3, ER-, PR-, HER2-) - Signed by Nicholas Lose, MD on 12/14/2018     MEDICAL HISTORY:  History reviewed. No pertinent past medical history.  SURGICAL HISTORY: Past Surgical History:  Procedure Laterality Date  . CESAREAN SECTION     . LAPAROSCOPIC APPENDECTOMY N/A 08/05/2015   Procedure: APPENDECTOMY LAPAROSCOPIC;  Surgeon: Arta Bruce Kinsinger, MD;  Location: [facility] OR;  Service: General;  Laterality: N/A;    SOCIAL HISTORY: Social History   Socioeconomic History  . Marital status: Married    Spouse name: Not on file  . Number of children: Not on file  . Years of education: Not on file  . Highest education level: Not on file  Occupational History  . Not on file  Social Needs  . Financial resource strain: Not on file  . Food insecurity    Worry: Not on file    Inability: Not on file  . Transportation needs    Medical: Not on file    Non-medical: Not on file  Tobacco Use  . Smoking status: Former Research scientist (life sciences)  . Smokeless tobacco: Never Used  Substance and Sexual Activity  . Alcohol use: Yes    Comment: "once a month"  . Drug use: No  . Sexual activity: Not on file  Lifestyle  . Physical activity    Days per week: Not on file    Minutes per session: Not on file  . Stress: Not on file  Relationships  . Social Herbalist on phone: Not on file    Gets together: Not on file    Attends religious service: Not on file    Active member of club or organization: Not on file    Attends meetings of clubs or organizations: Not on file    Relationship status: Not on file  . Intimate partner violence    Fear  of current or ex partner: Not on file    Emotionally abused: Not on file    Physically abused: Not on file    Forced sexual activity: Not on file  Other Topics Concern  . Not on file  Social History Narrative  . Not on file    FAMILY HISTORY: Family History  Problem Relation Age of Onset  . Breast cancer Cousin     ALLERGIES:  has No Known Allergies.  MEDICATIONS:  Current Outpatient Medications  Medication Sig Dispense Refill  . Cholecalciferol (VITAMIN D PO) Take 1 tablet by mouth daily.     Marland Kitchen HYDROcodone-acetaminophen (NORCO/VICODIN) 5-325 MG tablet Take 1-2 tablets by mouth every 6  (six) hours as needed for moderate pain. 40 tablet 0  . Multiple Vitamin (MULTIVITAMIN WITH MINERALS) TABS tablet Take 1 tablet by mouth daily.    . Olopatadine HCl (PATADAY OP) Place 1 drop into both eyes daily as needed. For dry eyes    . Probiotic Product (PROBIOTIC PO) Take 1 tablet by mouth daily.      No current facility-administered medications for this visit.     REVIEW OF SYSTEMS:   Constitutional: Denies fevers, chills or abnormal night sweats Eyes: Denies blurriness of vision, double vision or watery eyes Ears, nose, mouth, throat, and face: Denies mucositis or sore throat Respiratory: Denies cough, dyspnea or wheezes Cardiovascular: Denies palpitation, chest discomfort or lower extremity swelling Gastrointestinal:  Denies nausea, heartburn or change in bowel habits Skin: Denies abnormal skin rashes Lymphatics: Denies new lymphadenopathy or easy bruising Neurological:Denies numbness, tingling or new weaknesses Behavioral/Psych: Mood is stable, no new changes  Breast: Palpable left breast mass All other systems were reviewed with the patient and are negative.  PHYSICAL EXAMINATION: ECOG PERFORMANCE STATUS: 1 - Symptomatic but completely ambulatory  Vitals:   12/14/18 0913  BP: (!) 142/83  Pulse: 88  Resp: 18  Temp: 98.2 F (36.8 C)  SpO2: 100%   Filed Weights   12/14/18 0913  Weight: 131 lb 12.8 oz (59.8 kg)    GENERAL:alert, no distress and comfortable SKIN: skin color, texture, turgor are normal, no rashes or significant lesions EYES: normal, conjunctiva are pink and non-injected, sclera clear OROPHARYNX:no exudate, no erythema and lips, buccal mucosa, and tongue normal  NECK: supple, thyroid normal size, non-tender, without nodularity LYMPH:  no palpable lymphadenopathy in the cervical, axillary or inguinal LUNGS: clear to auscultation and percussion with normal breathing effort HEART: regular rate & rhythm and no murmurs and no lower extremity edema  ABDOMEN:abdomen soft, non-tender and normal bowel sounds Musculoskeletal:no cyanosis of digits and no clubbing  PSYCH: alert & oriented x 3 with fluent speech NEURO: no focal motor/sensory deficits BREAST: Palpable nodule in the left breast. No palpable axillary or supraclavicular lymphadenopathy (exam performed in the presence of a chaperone)   LABORATORY DATA:  I have reviewed the data as listed Lab Results  Component Value Date   WBC 7.5 12/14/2018   HGB 13.4 12/14/2018   HCT 39.8 12/14/2018   MCV 91.5 12/14/2018   PLT 201 12/14/2018   Lab Results  Component Value Date   NA 140 12/14/2018   K 3.8 12/14/2018   CL 102 12/14/2018   CO2 28 12/14/2018    RADIOGRAPHIC STUDIES: I have personally reviewed the radiological reports and agreed with the findings in the report.  ASSESSMENT AND PLAN:  Malignant neoplasm of lower-outer quadrant of left breast of female, estrogen receptor negative ([place]) 12/07/2018:Patient palpated a lump in the left  breast. Mammogram showed a 1.4cm mass at the 5 o'clock position in the left breast, a left axillary lymph node measuring 2.8cm, and 2 smaller axillary lymph nodes with cortical thickening. 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 Stage IIb  Pathology and radiology counseling: Discussed with the patient, the details of pathology including the type of breast cancer,the clinical staging, the significance of ER, PR and HER-2/neu receptors and the implications for treatment. After reviewing the pathology in detail, we proceeded to discuss the different treatment options between surgery, radiation, chemotherapy, antiestrogen therapies.  Recommendation based on multidisciplinary tumor board: 1. Neoadjuvant chemotherapy with Adriamycin and Cytoxan dose dense 4 followed by Taxol weekly 12 with carboplatin every 3 weeks 2. Followed by breast conserving surgery with targeted axillary dissection 3. Followed  by adjuvant radiation therapy  Chemotherapy Counseling: I discussed the risks and benefits of chemotherapy including the risks of nausea/ vomiting, risk of infection from low WBC count, fatigue due to chemo or anemia, bruising or bleeding due to low platelets, mouth sores, loss/ change in taste and decreased appetite. Liver and kidney function will be monitored through out chemotherapy as abnormalities in liver and kidney function may be a side effect of treatment. Cardiac dysfunction due to Adriamycin and neuropathy risk with Taxol were discussed in detail. Risk of permanent bone marrow dysfunction due to chemo were also discussed.  Plan: 1. Port placement to be done next Monday 2. Echocardiogram 3. Chemotherapy class 4. Breast MRI 5.  Genetic counseling  Counseled her about the swog 1417 clinical trial for neuropathy evaluation during chemo Return to clinic in 1-2 weeks to start chemotherapy.   All questions were answered. The patient knows to call the clinic with any problems, questions or concerns.   Rulon Eisenmenger, MD, MPH 12/14/2018    I, Molly Dorshimer, am acting as scribe for Nicholas Lose, MD.  I have reviewed the above documentation for accuracy and completeness, and I agree with the above.

## 2018-12-14 ENCOUNTER — Other Ambulatory Visit: Payer: Self-pay | Admitting: *Deleted

## 2018-12-14 ENCOUNTER — Ambulatory Visit: Payer: Commercial Managed Care - PPO | Attending: General Surgery | Admitting: Physical Therapy

## 2018-12-14 ENCOUNTER — Encounter: Payer: Self-pay | Admitting: Hematology and Oncology

## 2018-12-14 ENCOUNTER — Ambulatory Visit
Admission: RE | Admit: 2018-12-14 | Discharge: 2018-12-14 | Disposition: A | Discharge status: Home / Self Care | Payer: Commercial Managed Care - PPO | Source: Ambulatory Visit | Attending: Radiation Oncology | Admitting: Radiation Oncology

## 2018-12-14 ENCOUNTER — Other Ambulatory Visit: Payer: Self-pay | Admitting: General Surgery

## 2018-12-14 ENCOUNTER — Encounter: Payer: Self-pay | Admitting: Licensed Clinical Social Worker

## 2018-12-14 ENCOUNTER — Other Ambulatory Visit: Payer: Self-pay

## 2018-12-14 ENCOUNTER — Encounter: Payer: Self-pay | Admitting: Physical Therapy

## 2018-12-14 ENCOUNTER — Inpatient Hospital Stay: Payer: Commercial Managed Care - PPO

## 2018-12-14 ENCOUNTER — Telehealth: Payer: Self-pay | Admitting: Hematology and Oncology

## 2018-12-14 ENCOUNTER — Ambulatory Visit (HOSPITAL_BASED_OUTPATIENT_CLINIC_OR_DEPARTMENT_OTHER): Payer: Commercial Managed Care - PPO | Admitting: Licensed Clinical Social Worker

## 2018-12-14 ENCOUNTER — Inpatient Hospital Stay: Payer: Commercial Managed Care - PPO | Attending: Hematology and Oncology | Admitting: Hematology and Oncology

## 2018-12-14 VITALS — BP 142/83 | HR 88 | Temp 98.2°F | Resp 18 | Ht 62.0 in | Wt 131.8 lb

## 2018-12-14 DIAGNOSIS — C50512 Malignant neoplasm of lower-outer quadrant of left female breast: Secondary | ICD-10-CM | POA: Diagnosis not present

## 2018-12-14 DIAGNOSIS — Z171 Estrogen receptor negative status [ER-]: Secondary | ICD-10-CM | POA: Diagnosis not present

## 2018-12-14 DIAGNOSIS — Z7689 Persons encountering health services in other specified circumstances: Secondary | ICD-10-CM | POA: Diagnosis not present

## 2018-12-14 DIAGNOSIS — Z5111 Encounter for antineoplastic chemotherapy: Secondary | ICD-10-CM | POA: Diagnosis not present

## 2018-12-14 DIAGNOSIS — Z87891 Personal history of nicotine dependence: Secondary | ICD-10-CM | POA: Insufficient documentation

## 2018-12-14 DIAGNOSIS — Z803 Family history of malignant neoplasm of breast: Secondary | ICD-10-CM | POA: Diagnosis not present

## 2018-12-14 DIAGNOSIS — C773 Secondary and unspecified malignant neoplasm of axilla and upper limb lymph nodes: Secondary | ICD-10-CM | POA: Diagnosis not present

## 2018-12-14 DIAGNOSIS — Z79899 Other long term (current) drug therapy: Secondary | ICD-10-CM | POA: Insufficient documentation

## 2018-12-14 LAB — CBC WITH DIFFERENTIAL (CANCER CENTER ONLY)
Abs Immature Granulocytes: 0.02 10*3/uL (ref 0.00–0.07)
Basophils Absolute: 0.1 10*3/uL (ref 0.0–0.1)
Basophils Relative: 1 %
Eosinophils Absolute: 0 10*3/uL (ref 0.0–0.5)
Eosinophils Relative: 1 %
HCT: 39.8 % (ref 36.0–46.0)
Hemoglobin: 13.4 g/dL (ref 12.0–15.0)
Immature Granulocytes: 0 %
Lymphocytes Relative: 21 %
Lymphs Abs: 1.6 10*3/uL (ref 0.7–4.0)
MCH: 30.8 pg (ref 26.0–34.0)
MCHC: 33.7 g/dL (ref 30.0–36.0)
MCV: 91.5 fL (ref 80.0–100.0)
Monocytes Absolute: 0.6 10*3/uL (ref 0.1–1.0)
Monocytes Relative: 8 %
Neutro Abs: 5.2 10*3/uL (ref 1.7–7.7)
Neutrophils Relative %: 69 %
Platelet Count: 201 10*3/uL (ref 150–400)
RBC: 4.35 MIL/uL (ref 3.87–5.11)
RDW: 12.6 % (ref 11.5–15.5)
WBC Count: 7.5 10*3/uL (ref 4.0–10.5)
nRBC: 0 % (ref 0.0–0.2)

## 2018-12-14 LAB — CMP (CANCER CENTER ONLY)
ALT: 10 U/L (ref 0–44)
AST: 18 U/L (ref 15–41)
Albumin: 4.4 g/dL (ref 3.5–5.0)
Alkaline Phosphatase: 90 U/L (ref 38–126)
Anion gap: 10 (ref 5–15)
BUN: 11 mg/dL (ref 6–20)
CO2: 28 mmol/L (ref 22–32)
Calcium: 9.9 mg/dL (ref 8.9–10.3)
Chloride: 102 mmol/L (ref 98–111)
Creatinine: 0.86 mg/dL (ref 0.44–1.00)
GFR, Est AFR Am: >60 mL/min (ref >60)
GFR, Estimated: >60 mL/min (ref >60)
Glucose, Bld: 97 mg/dL (ref 70–99)
Potassium: 3.8 mmol/L (ref 3.5–5.1)
Sodium: 140 mmol/L (ref 135–145)
Total Bilirubin: 0.8 mg/dL (ref 0.3–1.2)
Total Protein: 8.2 g/dL — ABNORMAL HIGH (ref 6.5–8.1)

## 2018-12-14 MED ORDER — ONDANSETRON HCL 8 MG PO TABS: 8.0000 mg | ORAL_TABLET | Freq: Two times a day (BID) | ORAL | 1 refills | Status: DC | PRN

## 2018-12-14 MED ORDER — DEXAMETHASONE 4 MG PO TABS: ORAL_TABLET | ORAL | 0 refills | Status: DC

## 2018-12-14 MED ORDER — LORAZEPAM 0.5 MG PO TABS: 0.5000 mg | ORAL_TABLET | Freq: Every evening | ORAL | 0 refills | Status: DC | PRN

## 2018-12-14 MED ORDER — PROCHLORPERAZINE MALEATE 10 MG PO TABS: 10.0000 mg | ORAL_TABLET | Freq: Four times a day (QID) | ORAL | 1 refills | Status: DC | PRN

## 2018-12-14 MED ORDER — LIDOCAINE-PRILOCAINE 2.5-2.5 % EX CREA: TOPICAL_CREAM | CUTANEOUS | 3 refills | Status: DC

## 2018-12-14 NOTE — Assessment & Plan Note (Signed)
12/07/2018:Patient palpated a lump in the left breast. Mammogram showed a 1.4cm mass at the 5 o'clock position in the left breast, a left axillary lymph node measuring 2.8cm, and 2 smaller axillary lymph nodes with cortical thickening. 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 Stage IIb  Pathology and radiology counseling: Discussed with the patient, the details of pathology including the type of breast cancer,the clinical staging, the significance of ER, PR and HER-2/neu receptors and the implications for treatment. After reviewing the pathology in detail, we proceeded to discuss the different treatment options between surgery, radiation, chemotherapy, antiestrogen therapies.  Recommendation based on multidisciplinary tumor board: 1. Neoadjuvant chemotherapy with Adriamycin and Cytoxan dose dense 4 followed by Taxol weekly 12 with carboplatin every 3 weeks 2. Followed by breast conserving surgery with sentinel lymph node study vs targeted axillary dissection 3. Followed by adjuvant radiation therapy  Chemotherapy Counseling: I discussed the risks and benefits of chemotherapy including the risks of nausea/ vomiting, risk of infection from low WBC count, fatigue due to chemo or anemia, bruising or bleeding due to low platelets, mouth sores, loss/ change in taste and decreased appetite. Liver and kidney function will be monitored through out chemotherapy as abnormalities in liver and kidney function may be a side effect of treatment. Cardiac dysfunction due to Adriamycin and neuropathy risk with Taxol were discussed in detail. Risk of permanent bone marrow dysfunction due to chemo were also discussed.  Plan: 1. Port placement to be done next Monday 2. Echocardiogram 3. Chemotherapy class 4. Breast MRI  Counseled her about the swog 1417 clinical trial for neuropathy evaluation during chemo Return to clinic in 1-2 weeks to start chemotherapy.

## 2018-12-14 NOTE — Progress Notes (Signed)
Radiation Oncology         [phone]) [phone] ________________________________  Name: Heather Hebert        MRN: [id number]  Date of Service: 12/14/2018 DOB: 11-03-1960  CC:[name], FNP  [name], MD     REFERRING PHYSICIAN: [name], MD   DIAGNOSIS: The encounter diagnosis was Malignant neoplasm of lower-outer quadrant of left breast of female, estrogen receptor negative ([facility]).   HISTORY OF PRESENT ILLNESS: Heather Hebert is a 58 y.o. female seen in the multidisciplinary breast clinic for a new diagnosis of left breast cancer. The patient was noted to have a palpable mass in the left breast.  Diagnostic imaging revealed a mass at 5:00 measuring 1.4 x 1.2 x 0.9 cm.  She had an associated abnormal appearing lymph node and 2 additional lymph nodes with thickened cortices.  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  Her tumor was triple negative with a Ki-67 of 80%.    PREVIOUS RADIATION THERAPY: No   PAST MEDICAL HISTORY: No past medical history on file.     PAST SURGICAL HISTORY: Past Surgical History:  Procedure Laterality Date  . CESAREAN SECTION    . LAPAROSCOPIC APPENDECTOMY N/A 08/05/2015   Procedure: APPENDECTOMY LAPAROSCOPIC;  Surgeon: Arta Bruce Kinsinger, MD;  Location: [facility] OR;  Service: General;  Laterality: N/A;     FAMILY HISTORY:  Family History  Problem Relation Age of Onset  . Breast cancer Cousin      SOCIAL HISTORY:  reports that she has quit smoking. She has never used smokeless tobacco. She reports current alcohol use. She reports that she does not use drugs. The patient is married and lives in [place]. She works for [employer] as a [employer]. She and her husband are planning to move to [facility] in the next year or so.   ALLERGIES: Patient has no known allergies.    MEDICATIONS:  Current Outpatient Medications  Medication Sig Dispense Refill  . Cholecalciferol (VITAMIN D PO) Take 1 tablet by mouth daily.     Marland Kitchen HYDROcodone-acetaminophen (NORCO/VICODIN) 5-325 MG tablet Take 1-2 tablets by mouth every 6 (six) hours as needed for moderate pain. 40 tablet 0  . Multiple Vitamin (MULTIVITAMIN WITH MINERALS) TABS tablet Take 1 tablet by mouth daily.    . Olopatadine HCl (PATADAY OP) Place 1 drop into both eyes daily as needed. For dry eyes    . Probiotic Product (PROBIOTIC PO) Take 1 tablet by mouth daily.      No current facility-administered medications for this encounter.      REVIEW OF SYSTEMS: On review of systems, the patient reports that she is doing well overall. She denies any chest pain, shortness of breath, cough, fevers, chills, night sweats, unintended weight changes. She denies any bowel or bladder disturbances, and denies abdominal pain, nausea or vomiting. She denies any new musculoskeletal or joint aches or pains. A complete review of systems is obtained and is otherwise negative.     PHYSICAL EXAM:  Wt Readings from Last 3 Encounters:  12/14/18 131 lb 12.8 oz (59.8 kg)  08/05/15 132 lb (59.9 kg)   Temp Readings from Last 3 Encounters:  12/14/18 98.2 F (36.8 C) (Temporal)  08/06/15 98 F (36.7 C) (Oral)   BP Readings from Last 3 Encounters:  12/14/18 (!) 142/83  08/06/15 105/60  Pulse Readings from Last 3 Encounters:  12/14/18 88  08/06/15 61    In general this is a well appearing caucasian female in no acute distress. She's alert and oriented x4 and appropriate throughout the examination. Cardiopulmonary assessment is negative for acute distress and she exhibits normal effort. Bilateral breast exam is deferred.  ECOG = 0  0 - Asymptomatic (Fully active, able to carry on all predisease activities without restriction)  1 - Symptomatic but completely ambulatory (Restricted in physically strenuous activity but ambulatory and  able to carry out work of a light or sedentary nature. For example, light housework, office work)  2 - Symptomatic, <50% in bed during the day (Ambulatory and capable of all self care but unable to carry out any work activities. Up and about more than 50% of waking hours)  3 - Symptomatic, >50% in bed, but not bedbound (Capable of only limited self-care, confined to bed or chair 50% or more of waking hours)  4 - Bedbound (Completely disabled. Cannot carry on any self-care. Totally confined to bed or chair)  5 - Death   Eustace Pen MM, Creech RH, Tormey DC, et al. [phone]). "Toxicity and response criteria of the [facility]". [place] Oncol. 5 (6): 649-55    LABORATORY DATA:  Lab Results  Component Value Date   WBC 7.5 12/14/2018   HGB 13.4 12/14/2018   HCT 39.8 12/14/2018   MCV 91.5 12/14/2018   PLT 201 12/14/2018   Lab Results  Component Value Date   NA 140 12/14/2018   K 3.8 12/14/2018   CL 102 12/14/2018   CO2 28 12/14/2018   Lab Results  Component Value Date   ALT 10 12/14/2018   AST 18 12/14/2018   ALKPHOS 90 12/14/2018   BILITOT 0.8 12/14/2018      RADIOGRAPHY: US Breast Ltd Uni Left Inc Axilla  Result Date: 12/02/2018 CLINICAL DATA:  58 year old patient presents for evaluation of a new palpable lump in the lower outer quadrant of the left breast that her husband noticed recently. EXAM: DIGITAL DIAGNOSTIC LEFT MAMMOGRAM WITH CAD AND TOMO ULTRASOUND LEFT BREAST COMPARISON:  Previous exam(s). ACR Breast Density Category c: The breast tissue is heterogeneously dense, which may obscure small masses. FINDINGS: Lobulated mass is seen in the lower outer quadrant of the left breast at the site of palpable concern. Some of the margins are circumscribed and some are indistinct. The mass is in the posterior third of the breast parenchyma. No additional mass, distortion, or suspicious microcalcification is identified. Mammographic images were processed with CAD.  On physical exam, approximately 1.5 cm mass is palpated in the 5 o'clock position left breast approximately 5 cm from the nipple. Targeted ultrasound is performed, showing a heterogeneously hypoechoic irregular mass with some angulated and indistinct margins. The mass is at 5 o'clock position 5 cm from the nipple and measures 1.4 x 0.9 x 1.2 cm. Vascular flow is seen adjacent to the mass and along one of its borders. The deep margin of the mass abuts the pectoralis muscle. Ultrasound of the left axilla shows a 2.8 cm x 1.0 cm lymph node with a normal fatty hilum and focally thickened cortex. The thickened portion of the cortex measures up to 0.5 cm. Prominent vascular flow is seen within the thickened portion of the cortex. Two smaller left axillary lymph nodes are identified the have diffusely mildly thickened cortices of approximately 0.37 cm. IMPRESSION: Suspicious palpable mass in the 5 o'clock axis of the left  breast. Left axillary lymph node with abnormal focal cortical thickening and 2 additional left axillary lymph nodes with mildly thickened cortices. RECOMMENDATION: Ultrasound-guided core needle biopsy of the left breast mass is recommended. Ultrasound-guided core needle biopsy of the focally thickened cortex of the 2.8 x 1.0 cm left axillary lymph node is recommended. I have discussed the findings and recommendations with the patient. If applicable, a reminder letter will be sent to the patient regarding the next appointment. BI-RADS CATEGORY  5: Highly suggestive of malignancy. Electronically Signed   By: Curlene Dolphin M.D.   On: 12/02/2018 16:02   Mm Diag Breast Tomo Uni Left  Result Date: 12/02/2018 CLINICAL DATA:  58 year old patient presents for evaluation of a new palpable lump in the lower outer quadrant of the left breast that her husband noticed recently. EXAM: DIGITAL DIAGNOSTIC LEFT MAMMOGRAM WITH CAD AND TOMO ULTRASOUND LEFT BREAST COMPARISON:  Previous exam(s). ACR Breast Density  Category c: The breast tissue is heterogeneously dense, which may obscure small masses. FINDINGS: Lobulated mass is seen in the lower outer quadrant of the left breast at the site of palpable concern. Some of the margins are circumscribed and some are indistinct. The mass is in the posterior third of the breast parenchyma. No additional mass, distortion, or suspicious microcalcification is identified. Mammographic images were processed with CAD. On physical exam, approximately 1.5 cm mass is palpated in the 5 o'clock position left breast approximately 5 cm from the nipple. Targeted ultrasound is performed, showing a heterogeneously hypoechoic irregular mass with some angulated and indistinct margins. The mass is at 5 o'clock position 5 cm from the nipple and measures 1.4 x 0.9 x 1.2 cm. Vascular flow is seen adjacent to the mass and along one of its borders. The deep margin of the mass abuts the pectoralis muscle. Ultrasound of the left axilla shows a 2.8 cm x 1.0 cm lymph node with a normal fatty hilum and focally thickened cortex. The thickened portion of the cortex measures up to 0.5 cm. Prominent vascular flow is seen within the thickened portion of the cortex. Two smaller left axillary lymph nodes are identified the have diffusely mildly thickened cortices of approximately 0.37 cm. IMPRESSION: Suspicious palpable mass in the 5 o'clock axis of the left breast. Left axillary lymph node with abnormal focal cortical thickening and 2 additional left axillary lymph nodes with mildly thickened cortices. RECOMMENDATION: Ultrasound-guided core needle biopsy of the left breast mass is recommended. Ultrasound-guided core needle biopsy of the focally thickened cortex of the 2.8 x 1.0 cm left axillary lymph node is recommended. I have discussed the findings and recommendations with the patient. If applicable, a reminder letter will be sent to the patient regarding the next appointment. BI-RADS CATEGORY  5: Highly suggestive  of malignancy. Electronically Signed   By: Curlene Dolphin M.D.   On: 12/02/2018 16:02   [place] Axillary Node Core Biopsy Left  Addendum Date: 12/07/2018   ADDENDUM REPORT: 12/06/2018 13:49 ADDENDUM: Pathology revealed 1- GRADE III INVASIVE DUCTAL CARCINOMA, LYMPHOVASCULAR INVASION IS IDENTIFIED of the LEFT breast, 5 o'clock position. This was found to be concordant by Dr. Lillia Mountain. Pathology revealed 2- METASTATIC CARCINOMA IN 1 OF 1 LYMPH NODE (1/1) of the LEFT axilla. This was found to be concordant by Dr. Lillia Mountain. Pathology results were discussed with the patient by telephone. The patient reported doing well after the biopsies with tenderness at the sites. Post biopsy instructions and care were reviewed and questions were answered. The patient was encouraged  to call [facility] for any additional concerns. The patient was referred to [facility] on December 14, 2018. Pathology results reported by Stacie Acres, RN on 12/06/2018. Electronically Signed   By: Lillia Mountain M.D.   On: 12/06/2018 13:49   Result Date: 12/07/2018 CLINICAL DATA:  Left breast mass and axillary adenopathy. EXAM: ULTRASOUND GUIDED LEFT BREAST AND AXILLARY LYMPH NODE BIOPSIES COMPARISON:  Previous exam(s). FINDINGS: I met with the patient and we discussed the procedure of ultrasound-guided biopsy, including benefits and alternatives. We discussed the high likelihood of a successful procedure. We discussed the risks of the procedure, including infection, bleeding, tissue injury, clip migration, and inadequate sampling. Informed written consent was given. The usual time-out protocol was performed immediately prior to the procedure. Lesion quadrant: LOWER OUTER QUADRANT Using sterile technique and 1% lidocaine and 1% lidocaine with epinephrine as local anesthetic, under direct ultrasound visualization, a 12 gauge spring-loaded device was  used to perform biopsy of a mass in the 5 o'clock region of the left breast using a lateral to medial approach. At the conclusion of the procedure ribbon shaped tissue marker clip was deployed into the biopsy cavity. Follow up 2 view mammogram was performed and dictated separately. I met with the patient and we discussed the procedure of ultrasound-guided biopsy, including benefits and alternatives. We discussed the high likelihood of a successful procedure. We discussed the risks of the procedure, including infection, bleeding, tissue injury, clip migration, and inadequate sampling. Informed written consent was given. The usual time-out protocol was performed immediately prior to the procedure. Lesion quadrant: LEFT AXILLA Using sterile technique and 1% lidocaine and 1% lidocaine with epinephrine as local anesthetic, under direct ultrasound visualization, a 14 gauge spring-loaded device was used to perform biopsy of a left axillary lymph node using an inferior to superior approach. At the conclusion of the procedure [facility] tissue marker clip was deployed into the biopsy cavity. Follow up 2 view mammogram was performed and dictated separately. IMPRESSION: Status post ultrasound-guided core biopsies of a mass in the 5 o'clock region of the left breast and a left axillary lymph node. No apparent complications. Electronically Signed: By: Lillia Mountain M.D. On: 12/05/2018 13:32   Mm Clip Placement Left  Result Date: 12/05/2018 CLINICAL DATA:  Status post ultrasound-guided core biopsies of a mass in the 5 o'clock region of the left breast and a left axillary lymph node. EXAM: DIAGNOSTIC LEFT MAMMOGRAM POST ULTRASOUND BIOPSIES COMPARISON:  Previous exam(s). FINDINGS: Mammographic images were obtained following ultrasound guided biopsies of a mass in the 5 o'clock region of the left breast and a lymph node in the left axilla. The ribbon shaped biopsy marker clips in the mass in the 5 o'clock region of the left breast  and the Q shaped HydroMARK clip in the left axilla are in appropriate position. IMPRESSION: Appropriate positioning of the ribbon shaped clip in the 5 o'clock region of the left breast and the Q shaped biopsy marking clip in the left axilla. Final Assessment: Post Procedure Mammograms for Marker Placement Electronically Signed   By: Lillia Mountain M.D.   On: 12/05/2018 13:44   [place] Lt Breast Bx W Loc Dev 1st Lesion Img Bx Spec US Guide  Addendum Date: 12/07/2018   ADDENDUM REPORT: 12/06/2018 13:49 ADDENDUM: Pathology revealed 1- GRADE III INVASIVE DUCTAL CARCINOMA, LYMPHOVASCULAR INVASION IS IDENTIFIED of the LEFT breast, 5 o'clock position. This was found to be concordant  by Dr. Lillia Mountain. Pathology revealed 2- METASTATIC CARCINOMA IN 1 OF 1 LYMPH NODE (1/1) of the LEFT axilla. This was found to be concordant by Dr. Lillia Mountain. Pathology results were discussed with the patient by telephone. The patient reported doing well after the biopsies with tenderness at the sites. Post biopsy instructions and care were reviewed and questions were answered. The patient was encouraged to call [facility] for any additional concerns. The patient was referred to [facility] on December 14, 2018. Pathology results reported by Stacie Acres, RN on 12/06/2018. Electronically Signed   By: Lillia Mountain M.D.   On: 12/06/2018 13:49   Result Date: 12/07/2018 CLINICAL DATA:  Left breast mass and axillary adenopathy. EXAM: ULTRASOUND GUIDED LEFT BREAST AND AXILLARY LYMPH NODE BIOPSIES COMPARISON:  Previous exam(s). FINDINGS: I met with the patient and we discussed the procedure of ultrasound-guided biopsy, including benefits and alternatives. We discussed the high likelihood of a successful procedure. We discussed the risks of the procedure, including infection, bleeding, tissue injury, clip migration, and inadequate sampling. Informed  written consent was given. The usual time-out protocol was performed immediately prior to the procedure. Lesion quadrant: LOWER OUTER QUADRANT Using sterile technique and 1% lidocaine and 1% lidocaine with epinephrine as local anesthetic, under direct ultrasound visualization, a 12 gauge spring-loaded device was used to perform biopsy of a mass in the 5 o'clock region of the left breast using a lateral to medial approach. At the conclusion of the procedure ribbon shaped tissue marker clip was deployed into the biopsy cavity. Follow up 2 view mammogram was performed and dictated separately. I met with the patient and we discussed the procedure of ultrasound-guided biopsy, including benefits and alternatives. We discussed the high likelihood of a successful procedure. We discussed the risks of the procedure, including infection, bleeding, tissue injury, clip migration, and inadequate sampling. Informed written consent was given. The usual time-out protocol was performed immediately prior to the procedure. Lesion quadrant: LEFT AXILLA Using sterile technique and 1% lidocaine and 1% lidocaine with epinephrine as local anesthetic, under direct ultrasound visualization, a 14 gauge spring-loaded device was used to perform biopsy of a left axillary lymph node using an inferior to superior approach. At the conclusion of the procedure [facility] tissue marker clip was deployed into the biopsy cavity. Follow up 2 view mammogram was performed and dictated separately. IMPRESSION: Status post ultrasound-guided core biopsies of a mass in the 5 o'clock region of the left breast and a left axillary lymph node. No apparent complications. Electronically Signed: By: Lillia Mountain M.D. On: 12/05/2018 13:32       IMPRESSION/PLAN: 1. Stage IIB, [id number] grade 3, triple negative invasive ductal carcinoma of the left breast. Dr. Lisbeth Renshaw discusses the pathology findings and reviews the nature of left breast disease. The consensus from the  breast conference includes the rationale to proceed with neoadjuvant chemotherapy followed by breast conserving surgery with lumpectomy and sentinel or targeted node excision. She would benefit from adjuvant radiotherapy given her nodal status. We discussed the risks, benefits, short, and long term effects of radiotherapy, and the patient is interested in proceeding at the appropriate interval. Dr. Lisbeth Renshaw discusses the delivery and logistics of radiotherapy and anticipates a course of 6 1/2 weeks of radiotherapy to the left breast and regional nodes with deep inspiration breath hold technique. We will see her back about 2 weeks after surgery to discuss the simulation process  and anticipate we starting radiotherapy about 4-6 weeks after surgery.  2. Possible genetic predisposition to malignancy. The patient is a candidate for genetic testing given her personal and family history. She was offered referral and she is interested in proceeding. She will be set up for referral.   In a visit lasting 30 minutes, greater than 50% of the time was spent face to face discussing her case, and coordinating the patient's care.  The above documentation reflects my direct findings during this shared patient visit. Please see the separate note by Dr. Lisbeth Renshaw on this date for the remainder of the patient's plan of care.    Carola Rhine, PAC

## 2018-12-14 NOTE — Therapy (Signed)
[place], [place], [address] Phone: [phone]   Fax:  [phone]  Physical Therapy Evaluation  Patient Details  Name: Heather Hebert MRN: [id number] Date of Birth: 1960/07/17 Referring Provider (PT): Dr. [name]   Encounter Date: 12/14/2018  PT End of Session - 12/14/18 1011    Visit Number  1    Number of Visits  1    PT Start Time  0920    PT Stop Time  0946    PT Time Calculation (min)  26 min    Activity Tolerance  Patient tolerated treatment well    Behavior During Therapy  [facility] for tasks assessed/performed       History reviewed. No pertinent past medical history.  Past Surgical History:  Procedure Laterality Date  . CESAREAN SECTION    . LAPAROSCOPIC APPENDECTOMY N/A 08/05/2015   Procedure: APPENDECTOMY LAPAROSCOPIC;  Surgeon: Arta Bruce Kinsinger, MD;  Location: [facility];  Service: General;  Laterality: N/A;    There were no vitals filed for this visit.   Subjective Assessment - 12/14/18 1004    Subjective  Patient reports she is here today to be seen by her medical team for her newly diagnosed left breast cancer.    Patient is accompained by:  Family member    Pertinent History  Patient was diagnosed on 12/02/2018 with left grade III invasive ductal carcinoma breast cancer. It measures 1.4 cm and is located in the lower outer quadrant. It is triple negative with a Ki67 of 80%. She has 3 abnormal nodes and 1 was biopsied and found to be positive.    Patient Stated Goals  Reduce lymphedema risk and learn post op shoulder ROM HEP    Currently in Pain?  No/denies         [facility] PT Assessment - 12/14/18 0001      Assessment   Medical Diagnosis  Left breast cancer    Referring Provider (PT)  Dr. [name]    Onset Date/Surgical Date  12/02/18    Hand Dominance  Right    Prior Therapy  none      Precautions   Precautions  Other (comment)    Precaution Comments  active cancer       Restrictions   Weight Bearing Restrictions  No      Balance Screen   Has the patient fallen in the past 6 months  No    Has the patient had a decrease in activity level because of a fear of falling?   No    Is the patient reluctant to leave their home because of a fear of falling?   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    Leisure  She does aerobics videos 2-3x/week      Cognition   Overall Cognitive Status  Within Functional Limits for tasks assessed      Posture/Postural Control   Posture/Postural Control  No significant limitations      ROM / Strength   AROM / PROM / Strength  AROM;Strength      AROM   AROM Assessment Site  Shoulder    Right/Left Shoulder  Right;Left    Right Shoulder Extension  60 Degrees    Right Shoulder Flexion  155 Degrees    Right Shoulder ABduction  165 Degrees    Right Shoulder Internal Rotation  71 Degrees    Right Shoulder External Rotation  83 Degrees    Left Shoulder Extension  57 Degrees    Left Shoulder Flexion  155 Degrees    Left Shoulder ABduction  163 Degrees    Left Shoulder Internal Rotation  64 Degrees    Left Shoulder External Rotation  90 Degrees      Strength   Overall Strength  Within functional limits for tasks performed        LYMPHEDEMA/ONCOLOGY QUESTIONNAIRE - 12/14/18 1010      Type   Cancer Type  Left breast cancer      Lymphedema Assessments   Lymphedema Assessments  Upper extremities      Right Upper Extremity Lymphedema   10 cm Proximal to Olecranon Process  27.8 cm    Olecranon Process  24.6 cm    10 cm Proximal to Ulnar Styloid Process  23.5 cm    Just Proximal to Ulnar Styloid Process  16.3 cm    Across  Hand at [employer]  19.9 cm    At [place] of 2nd Digit  6.7 cm      Left Upper Extremity Lymphedema   10 cm Proximal to Olecranon Process  27.6 cm    Olecranon Process  24.4 cm    10 cm Proximal to Ulnar Styloid Process  22.8 cm    Just Proximal to Ulnar Styloid Process  16.3 cm    Across Hand at [employer]  19.8 cm    At [place] of 2nd Digit  6.7 cm          Quick Dash - 12/14/18 0001    Open a tight or new jar  Mild difficulty    Do heavy household chores (wash walls, wash floors)  No difficulty    Carry a shopping bag or briefcase  No difficulty    Wash your back  No difficulty    Use a knife to cut food  No difficulty    Recreational activities in which you take some force or impact through your arm, shoulder, or hand (golf, hammering, tennis)  No difficulty    During the past week, to what extent has your arm, shoulder or hand problem interfered with your normal social activities with family, friends, neighbors, or groups?  Not at all    During the past week, to what extent has your arm, shoulder or hand problem limited your work or other regular daily activities  Not at all    Arm, shoulder, or hand pain.  None    Tingling (pins and needles) in your arm, shoulder, or hand  None    Difficulty Sleeping  No difficulty    DASH Score  2.27 %        Objective measurements completed on examination: See above findings.       Patient was instructed today in a home exercise program today for post op shoulder range of motion. These included active assist shoulder flexion in sitting, scapular retraction, wall walking with shoulder abduction, and hands behind head external rotation.  She was encouraged to do these twice a day, holding 3 seconds and repeating 5 times when permitted by her physician.  PT Education - 12/14/18 1010    Education Details  Lymphedema risk reduction and post op shoulder ROM HEP    Person(s) Educated  Patient;Spouse    Methods   Explanation;Demonstration;Handout    Comprehension  Returned demonstration;Verbalized understanding           Breast Clinic Goals - 12/14/18 1129      Patient will be able to verbalize understanding of pertinent lymphedema risk reduction practices relevant to her diagnosis specifically related to skin care.   Time  1    Period  Days    Status  Achieved      Patient will be able to return demonstrate and/or verbalize understanding of the post-op home exercise program related to regaining shoulder range of motion.   Time  1    Period  Days    Status  Achieved      Patient will be able to verbalize understanding of the importance of attending the postoperative After Breast Cancer Class for further lymphedema risk reduction education and therapeutic exercise.   Time  1    Period  Days    Status  Achieved            Plan - 12/14/18 1012    Clinical Impression Statement  Patient was diagnosed on 12/02/2018 with left grade III invasive ductal carcinoma breast cancer. It measures 1.4 cm and is located in the lower outer quadrant. It is triple negative with a Ki67 of 80%. She has 3 abnormal nodes and 1 was biopsied and found to be positive. Her multidisciplinary medical team met prior to her assessments to determine a recommended treatment plan. She is planning to have neoadjuvant chemotherapy followed by a left lumpectomy and targeted axillary lymph node dissection and radiation. She will benefit from post op PT to regain shoulder ROM and function.    Stability/Clinical Decision Making  Stable/Uncomplicated    Clinical Decision Making  Low    Rehab Potential  Excellent    PT Frequency  --   Eval and 1 f/u visit   PT Treatment/Interventions  ADLs/Self Care Home Management;Therapeutic exercise;Patient/family education    PT Next Visit Plan  Will reassess after surgery if MD refers    PT Home Exercise Plan  Post op shoulder ROM HEP    Consulted and Agree with Plan of Care   Patient;Family member/caregiver    Family Member Consulted  Husband       Patient will benefit from skilled therapeutic intervention in order to improve the following deficits and impairments:  Decreased knowledge of precautions, Postural dysfunction, Decreased range of motion, Pain, Impaired UE functional use  Visit Diagnosis: Malignant neoplasm of lower-outer quadrant of left breast of female, estrogen receptor negative ([place]) - Plan: PT plan of care cert/re-cert   Patient will follow up at outpatient cancer rehab 3-4 weeks following surgery.  If the patient requires physical therapy at that time, a specific plan will be dictated and sent to the referring physician for approval. The patient was educated today on appropriate basic range of motion exercises to begin post operatively and the importance of attending the After Breast Cancer class following surgery.  Patient was educated today on lymphedema risk reduction practices as it pertains to recommendations that will benefit the patient immediately following surgery.  She verbalized good understanding.      Problem List Patient Active Problem List   Diagnosis Date Noted  . Malignant neoplasm of lower-outer quadrant of left breast of female, estrogen receptor negative (  [place]) 12/07/2018  . Acute appendicitis with localized peritonitis 08/05/2015   Annia Friendly, PT 12/14/18 11:31 AM  [place], [place], [address] Phone: [phone]   Fax:  [phone]  Name: Heather Hebert MRN: [id number] Date of Birth: 1960/07/18

## 2018-12-14 NOTE — Progress Notes (Signed)
START ON PATHWAY REGIMEN - Breast     A cycle is every 14 days (cycles 1-4):     Doxorubicin      Cyclophosphamide      Pegfilgrastim-xxxx    A cycle is every 21 days (cycles 5-8):     Paclitaxel      Carboplatin   **Always confirm dose/schedule in your pharmacy ordering system**  Patient Characteristics: Preoperative or Nonsurgical Candidate (Clinical Staging), Neoadjuvant Therapy followed by Surgery, Invasive Disease, Chemotherapy, HER2 Negative/Unknown/Equivocal, ER Negative/Unknown, Platinum Therapy Indicated Therapeutic Status: Preoperative or Nonsurgical Candidate (Clinical Staging) AJCC M Category: cM0 AJCC Grade: G3 Breast Surgical Plan: Neoadjuvant Therapy followed by Surgery ER Status: Negative (-) AJCC 8 Stage Grouping: IIB HER2 Status: Negative (-) AJCC T Category: cT1c AJCC N Category: cN1 PR Status: Negative (-) Type of Therapy: Platinum Therapy Indicated Intent of Therapy: Curative Intent, Discussed with Patient 

## 2018-12-14 NOTE — Progress Notes (Signed)
REFERRING PROVIDER: Nicholas Lose, MD [address] [place],  [place] [address]  PRIMARY PROVIDER:  [name], FNP  PRIMARY REASON FOR VISIT:  1. Malignant neoplasm of lower-outer quadrant of left breast of female, estrogen receptor negative ([place])   2. Family history of breast cancer     I connected with Heather Hebert on 12/14/2018 at 11:45 AM EDT by Webex and verified that I am speaking with the correct person using two identifiers.    Patient location: [facility] Provider location: office  HISTORY OF PRESENT ILLNESS:   Heather Hebert, a 58 y.o. female, was seen for a [place] cancer genetics consultation at the request of Dr. Lindi Adie due to a personal and family history of cancer.  Heather Hebert presents to clinic today to discuss the possibility of a hereditary predisposition to cancer, genetic testing, and to further clarify her future cancer risks, as well as potential cancer risks for family members.   In 2020, at the age of 23, Heather Hebert was diagnosed with invasive ductal carcinoma of the left breast, triple negative. The treatment plan includes neoadjuvant chemotherapy, surgery, and radiation.   CANCER HISTORY:  Oncology History  Malignant neoplasm of lower-outer quadrant of left breast of female, estrogen receptor negative ([place])  12/07/2018 Initial Diagnosis   Patient palpated a lump in the left breast. Mammogram showed a 1.4cm mass at the 5 o'clock position in the left breast, a left axillary lymph node measuring 2.8cm, and 2 smaller axillary lymph nodes with cortical thickening. 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   12/14/2018 Cancer Staging   Staging form: Breast, AJCC 8th Edition - Clinical: Stage IIB (cT1c, cN1, cM0, G3, ER-, PR-, HER2-) - Signed by Nicholas Lose, MD on 12/14/2018   12/23/2018 -  Chemotherapy   The patient had DOXOrubicin (ADRIAMYCIN) chemo injection 98 mg, 60 mg/m2 = 98 mg, Intravenous,  Once, 0  of 4 cycles palonosetron (ALOXI) injection 0.25 mg, 0.25 mg, Intravenous,  Once, 0 of 8 cycles pegfilgrastim-jmdb (FULPHILA) injection 6 mg, 6 mg, Subcutaneous,  Once, 0 of 4 cycles CARBOplatin (PARAPLATIN) 540 mg in sodium chloride 0.9 % 250 mL chemo infusion, 540 mg (100 % of original dose 544.2 mg), Intravenous,  Once, 0 of 4 cycles Dose modification:   (original dose 544.2 mg, Cycle 5) cyclophosphamide (CYTOXAN) 980 mg in sodium chloride 0.9 % 250 mL chemo infusion, 600 mg/m2 = 980 mg, Intravenous,  Once, 0 of 4 cycles PACLitaxel (TAXOL) 132 mg in sodium chloride 0.9 % 250 mL chemo infusion (</= 68m/m2), 80 mg/m2 = 132 mg, Intravenous,  Once, 0 of 4 cycles fosaprepitant (EMEND) 150 mg, dexamethasone (DECADRON) 12 mg in sodium chloride 0.9 % 145 mL IVPB, , Intravenous,  Once, 0 of 8 cycles  for chemotherapy treatment.       RISK FACTORS:  Menarche was at age 58  First live birth at age 58  OCP use: Yes Ovaries intact: yes.  Hysterectomy: no.  Menopausal status: postmenopausal.  HRT use: 0 years. Colonoscopy: yes; normal. Mammogram within the last year: yes. Number of breast biopsies: 1. Up to date with pelvic exams: yes. Any excessive radiation exposure in the past: no  Past Medical History:  Diagnosis Date  . Family history of breast cancer     Past Surgical History:  Procedure Laterality Date  . CESAREAN SECTION    . LAPAROSCOPIC APPENDECTOMY N/A 08/05/2015   Procedure: APPENDECTOMY LAPAROSCOPIC;  Surgeon:  Mickeal Skinner, MD;  Location: [facility] OR;  Service: General;  Laterality: N/A;    Social History   Socioeconomic History  . Marital status: Married    Spouse name: Not on file  . Number of children: Not on file  . Years of education: Not on file  . Highest education level: Not on file  Occupational History  . Not on file  Social Needs  . Financial resource strain: Not on file  . Food insecurity    Worry: Not on file    Inability: Not on file  .  Transportation needs    Medical: Not on file    Non-medical: Not on file  Tobacco Use  . Smoking status: Former Research scientist (life sciences)  . Smokeless tobacco: Never Used  Substance and Sexual Activity  . Alcohol use: Yes    Comment: "once a month"  . Drug use: No  . Sexual activity: Not on file  Lifestyle  . Physical activity    Days per week: Not on file    Minutes per session: Not on file  . Stress: Not on file  Relationships  . Social Herbalist on phone: Not on file    Gets together: Not on file    Attends religious service: Not on file    Active member of club or organization: Not on file    Attends meetings of clubs or organizations: Not on file    Relationship status: Not on file  Other Topics Concern  . Not on file  Social History Narrative  . Not on file     FAMILY HISTORY:  We obtained a detailed, 4-generation family history.  Significant diagnoses are listed below: Family History  Problem Relation Age of Onset  . Breast cancer Cousin        first cousin once removed (father's cousin)   Heather Hebert has 2 daughters, no cancers. She has 1 sister, 18, who has not had cancer and does not have children.  Heather Hebert mother died at 89, no cancer history. Patient had 1 maternal aunt, no cancers. No known cancers in maternal cousins. Her maternal grandparents both passed in their 17s.  Heather Hebert father died at 64, no cancer history. Patient had 1 paternal aunt, no cancers. No known cancers in paternal cousins, but she doesn't keep in touch with these relatives. Patient's paternal grandparents did not have cancer. Her father's cousin (first cousin once removed) did have breast cancer in her 57s.   Ms. Pokorny is unaware of previous family history of genetic testing for hereditary cancer risks. Patient's ancestors are from the [place].. There is no reported Ashkenazi Jewish ancestry. There is no known consanguinity.  GENETIC COUNSELING ASSESSMENT: Heather Hebert is a 58 y.o.  female with a personal history of triple negative breast cancer which is somewhat suggestive of a hereditary cancer syndrome and predisposition to cancer. We, therefore, discussed and recommended the following at today's visit.   DISCUSSION: We discussed that 5 - 10% of breast cancer is hereditary, with most cases associated with BRCA1/BRCA2 mutations.  There are other genes that can be associated with hereditary breast cancer syndromes, and genes associated with other types of cancer not seen in Heather Hebert's family.  We discussed that testing is beneficial for several reasons including surgical decision-making for breast cancer, knowing how to follow individuals after completing their treatment, and understand if other family members could be at risk for cancer and allow them to undergo genetic testing.  We reviewed the characteristics, features and inheritance patterns of hereditary cancer syndromes. We also discussed genetic testing, including the appropriate family members to test, the process of testing, insurance coverage and turn-around-time for results. We discussed the implications of a negative, positive and/or variant of uncertain significant result. We recommended Heather Hebert pursue genetic testing for the Common Hereditary Cancers gene panel.   The Common Hereditary Cancers Panel offered by [facility] includes sequencing and/or deletion duplication testing of the following 48 genes: APC, ATM, AXIN2, BARD1, BMPR1A, BRCA1, BRCA2, BRIP1, CDH1, CDKN2A (p14ARF), CDKN2A (p16INK4a), CKD4, CHEK2, CTNNA1, DICER1, EPCAM (Deletion/duplication testing only), GREM1 (promoter region deletion/duplication testing only), KIT, MEN1, MLH1, MSH2, MSH3, MSH6, MUTYH, NBN, NF1, NHTL1, PALB2, PDGFRA, PMS2, POLD1, POLE, PTEN, RAD50, RAD51C, RAD51D, RNF43, SDHB, SDHC, SDHD, SMAD4, SMARCA4. STK11, TP53, TSC1, TSC2, and VHL.  The following genes were evaluated for sequence changes only: SDHA and HOXB13 c.251G>A variant  only.  Based on Heather Hebert's personal history of cancer, she meets medical criteria for genetic testing. Despite that she meets criteria, she may still have an out of pocket cost.   PLAN: Despite our recommendation, Heather Hebert did not wish to pursue genetic testing at today's visit. We understand this decision and remain available to coordinate genetic testing at any time in the future. We, therefore, recommend Ms. Schill continue to follow the cancer screening guidelines given by her primary healthcare provider.  Ms. Metzner questions were answered to her satisfaction today. Our contact information was provided should additional questions or concerns arise. Thank you for the referral and allowing [place] to share in the care of your patient.   Faith Rogue, MS, [facility] Genetic Counselor [email or web address] Phone: [phone]  The patient was seen for a total of 15 minutes in virtal genetic counseling. Her husband, Waunita Schooner, was also present. Drs. Magrinat, Lindi Adie and/or Burr Medico were available for discussion regarding this case.   _______________________________________________________________________ For Office Staff:  Number of people involved in session: 1 Was an Intern/ student involved with case: no

## 2018-12-14 NOTE — Telephone Encounter (Signed)
Scheduled appt per 11/4 sch message - scheduled 3 cycles per schedule guidelines.   Pt to get an updated schedule in clinic and at chemo edu appt.

## 2018-12-14 NOTE — Patient Instructions (Signed)

## 2018-12-15 ENCOUNTER — Encounter: Payer: Self-pay | Admitting: Licensed Clinical Social Worker

## 2018-12-15 DIAGNOSIS — Z1379 Encounter for other screening for genetic and chromosomal anomalies: Secondary | ICD-10-CM | POA: Insufficient documentation

## 2018-12-16 ENCOUNTER — Telehealth: Payer: Self-pay | Admitting: *Deleted

## 2018-12-16 NOTE — Telephone Encounter (Signed)
Spoke to pt concerning [place] from 12/14/18. Denies questions or concerns regarding dx or treatment care plan. Discussed appts on 11/11. Encourage pt to call with needs. Received verbal understanding.

## 2018-12-19 ENCOUNTER — Other Ambulatory Visit (HOSPITAL_COMMUNITY)
Admission: RE | Admit: 2018-12-19 | Discharge: 2018-12-19 | Disposition: A | Discharge status: Home / Self Care | Payer: Commercial Managed Care - PPO | Source: Ambulatory Visit | Attending: General Surgery | Admitting: General Surgery

## 2018-12-19 DIAGNOSIS — Z01812 Encounter for preprocedural laboratory examination: Secondary | ICD-10-CM | POA: Insufficient documentation

## 2018-12-19 DIAGNOSIS — Z20828 Contact with and (suspected) exposure to other viral communicable diseases: Secondary | ICD-10-CM | POA: Diagnosis not present

## 2018-12-20 LAB — NOVEL CORONAVIRUS, NAA (HOSP ORDER, SEND-OUT TO REF LAB; TAT 18-24 HRS): SARS-CoV-2, NAA: NOT DETECTED

## 2018-12-21 ENCOUNTER — Encounter (HOSPITAL_BASED_OUTPATIENT_CLINIC_OR_DEPARTMENT_OTHER): Payer: Self-pay | Admitting: *Deleted

## 2018-12-21 ENCOUNTER — Other Ambulatory Visit: Payer: Self-pay

## 2018-12-21 ENCOUNTER — Telehealth: Payer: Self-pay

## 2018-12-21 ENCOUNTER — Ambulatory Visit (HOSPITAL_COMMUNITY)
Admission: RE | Admit: 2018-12-21 | Discharge: 2018-12-21 | Disposition: A | Discharge status: Home / Self Care | Payer: Commercial Managed Care - PPO | Source: Ambulatory Visit | Attending: Hematology and Oncology | Admitting: Hematology and Oncology

## 2018-12-21 ENCOUNTER — Inpatient Hospital Stay: Payer: Commercial Managed Care - PPO

## 2018-12-21 ENCOUNTER — Ambulatory Visit (HOSPITAL_BASED_OUTPATIENT_CLINIC_OR_DEPARTMENT_OTHER)
Admission: RE | Admit: 2018-12-21 | Discharge: 2018-12-21 | Disposition: A | Discharge status: Home / Self Care | Payer: Commercial Managed Care - PPO | Source: Ambulatory Visit | Attending: Hematology and Oncology | Admitting: Hematology and Oncology

## 2018-12-21 DIAGNOSIS — C50512 Malignant neoplasm of lower-outer quadrant of left female breast: Secondary | ICD-10-CM | POA: Insufficient documentation

## 2018-12-21 DIAGNOSIS — Z171 Estrogen receptor negative status [ER-]: Secondary | ICD-10-CM | POA: Diagnosis not present

## 2018-12-21 DIAGNOSIS — I081 Rheumatic disorders of both mitral and tricuspid valves: Secondary | ICD-10-CM | POA: Insufficient documentation

## 2018-12-21 DIAGNOSIS — Z87891 Personal history of nicotine dependence: Secondary | ICD-10-CM | POA: Insufficient documentation

## 2018-12-21 MED ORDER — GADOBUTROL 1 MMOL/ML IV SOLN
6.0000 mL | Freq: Once | INTRAVENOUS | Status: AC | PRN
  Administered 2018-12-21: 6 mL via INTRAVENOUS

## 2018-12-21 NOTE — Telephone Encounter (Signed)
Nutrition Assessment  Reason for Assessment:  Pt attended Breast Clinic on 12/14/2018 and was given nutrition packet by nurse navigator  ASSESSMENT:  58 year old female with triple negative left breast cancer.  Planning neoadjuvant chemotherapy, followed by surgery, radiation.   Spoke with patient via phone to introduce self and service at [facility].  Patient reports somewhat of decreased appetite but feels it is related to all that is going on.   Medications:  Reviewed  Labs: reviewed  Anthropometrics:   Height: 62 inches Weight: 131 lb BMI: 24   NUTRITION DIAGNOSIS: Food and nutrition related knowledge deficit related to new diagnosis of breast cancer as evidenced by no prior need for nutrition related information.  INTERVENTION:   Discussed briefly packet of information regarding nutritional tips for breast cancer patients.  Questions answered.  Teachback method used.  Contact information provided and patient knows to contact me with questions/concerns.    MONITORING, EVALUATION, and GOAL: Pt will consume a healthy plant based diet to maintain lean body mass throughout treatment.   Nikoleta Dady B. Zenia Resides, [place], [place] Registered Dietitian [phone] (pager)

## 2018-12-21 NOTE — H&P (Signed)
Heather Hebert Documented: 12/14/2018 7:23 AM Location: [facility] Patient #: [id number] DOB: Jan 19, 1961 Married / Language: Heather Hebert / Race: White Female   History of Present Illness Heather Klein MD; 12/14/2018 12:08 PM) The patient is a 57 year old female who presents for a follow-up for Breast cancer. Pt is a 58 yo F who is referred for consultation by Heather Hebert/Heather Hebert for new diagnosis of left breast cancer 11/2018. She found a mass on self exam. She then had diagnostic imaging that showed a 1.4 cm mass at 5 o'clock by mammo and 2.8 cm on ultrasound. 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 She has a paternal cousin with breast cancer diagnosed in her early 5s.   She is retiring next week. She is a former smoker. She drinks some alcohol, but around 2-3 glasses per week. She had menarche at age 5. She underwent menopause around 10 years ago. She is a Advertising copywriter. She used hormonal contraception for around 13 years. She is up to date with colonoscopy.  Pt is [place] from [place].   dx mammogram/us 12/02/18 CLINICAL DATA: 58 year old patient presents for evaluation of a new palpable lump in the lower outer quadrant of the left breast that her husband noticed recently.  EXAM: DIGITAL DIAGNOSTIC LEFT MAMMOGRAM WITH CAD AND TOMO  ULTRASOUND LEFT BREAST  COMPARISON: Previous exam(s).  ACR Breast Density Category c: The breast tissue is heterogeneously dense, which may obscure small masses.  FINDINGS: Lobulated mass is seen in the lower outer quadrant of the left breast at the site of palpable concern. Some of the margins are circumscribed and some are indistinct. The mass is in the posterior third of the breast parenchyma. No additional mass, distortion, or suspicious microcalcification is identified.  Mammographic images were processed with CAD.  On physical exam, approximately  1.5 cm mass is palpated in the 5 o'clock position left breast approximately 5 cm from the nipple.  Targeted ultrasound is performed, showing a heterogeneously hypoechoic irregular mass with some angulated and indistinct margins. The mass is at 5 o'clock position 5 cm from the nipple and measures 1.4 x 0.9 x 1.2 cm. Vascular flow is seen adjacent to the mass and along one of its borders. The deep margin of the mass abuts the pectoralis muscle.  Ultrasound of the left axilla shows a 2.8 cm x 1.0 cm lymph node with a normal fatty hilum and focally thickened cortex. The thickened portion of the cortex measures up to 0.5 cm. Prominent vascular flow is seen within the thickened portion of the cortex. Two smaller left axillary lymph nodes are identified the have diffusely mildly thickened cortices of approximately 0.37 cm.  IMPRESSION: Suspicious palpable mass in the 5 o'clock axis of the left breast.  Left axillary lymph node with abnormal focal cortical thickening and 2 additional left axillary lymph nodes with mildly thickened cortices.  RECOMMENDATION: Ultrasound-guided core needle biopsy of the left breast mass is recommended.  Ultrasound-guided core needle biopsy of the focally thickened cortex of the 2.8 x 1.0 cm left axillary lymph node is recommended.  I have discussed the findings and recommendations with the patient. If applicable, a reminder letter will be sent to the patient regarding the next appointment.  BI-RADS CATEGORY 5: Highly suggestive of malignancy.   pathology 12/05/2018 1. Breast, left, needle core biopsy, 5 o'clock - INVASIVE DUCTAL CARCINOMA - LYMPHOVASCULAR INVASION IS IDENTIFIED. - SEE COMMENT. 2. Lymph node,  needle/core biopsy, left axilla - METASTATIC CARCINOMA IN 1 OF 1 LYMPH NODE (1/1). Microscopic Comment 1. The carcinoma appears grade III. The tumor cells are NEGATIVE for Her2 (0). Estrogen Receptor: 0%, NEGATIVE Progesterone Receptor: 0%,  NEGATIVE Proliferation Marker Ki67: 80%  Labs: 12/14/2018 CBC, CMET essentially normal.    Past Surgical History Heather Pummel, RN; 12/14/2018 7:24 AM) Appendectomy  Cesarean Section - 1  Colon Polyp Removal - Colonoscopy   Diagnostic Studies History Heather Pummel, RN; 12/14/2018 7:24 AM) Colonoscopy  5-10 years ago Mammogram  within last year Pap Smear  1-5 years ago  Medication History Heather Pummel, RN; 12/14/2018 7:24 AM) Medications Reconciled  Social History Heather Pummel, RN; 12/14/2018 7:24 AM) Alcohol use  Occasional alcohol use. Caffeine use  Coffee, Tea. No drug use  Tobacco use  Former smoker.  Family History Heather Pummel, RN; 12/14/2018 7:24 AM) Cerebrovascular Accident  Mother. Diabetes Mellitus  Mother. Heart Disease  Father. Hypertension  Father, Mother. Kidney Disease  Mother. Migraine Headache  Daughter. Thyroid problems  Mother.  Pregnancy / Birth History Heather Pummel, RN; 12/14/2018 7:24 AM) Age at menarche  46 years. Age of menopause  51-55 Contraceptive History  Oral contraceptives. Gravida  5 Length (months) of breastfeeding  12-24 Maternal age  92-30 Para  2 Regular periods   Other Problems Heather Pummel, RN; 12/14/2018 7:24 AM) Breast Cancer  Hemorrhoids  Migraine Headache  Other disease, cancer, significant illness     Review of Systems Heather Spillers Ledford RN; 12/14/2018 7:24 AM) General Present- Appetite Loss. Not Present- Chills, Fatigue, Fever, Night Sweats, Weight Gain and Weight Loss. Skin Not Present- Change in Wart/Mole, Dryness, Hives, Jaundice, New Lesions, Non-Healing Wounds, Rash and Ulcer. HEENT Present- Seasonal Allergies and Wears glasses/contact lenses. Not Present- Earache, Hearing Loss, Hoarseness, Nose Bleed, Oral Ulcers, Ringing in the Ears, Sinus Pain, Sore Throat, Visual Disturbances and Yellow Eyes. Respiratory Not Present- Bloody sputum, Chronic Cough, Difficulty Breathing,  Snoring and Wheezing. Breast Present- Breast Mass. Not Present- Breast Pain, Nipple Discharge and Skin Changes. Cardiovascular Not Present- Chest Pain, Difficulty Breathing Lying Down, Leg Cramps, Palpitations, Rapid Heart Rate, Shortness of Breath and Swelling of Extremities. Gastrointestinal Not Present- Abdominal Pain, Bloating, Bloody Stool, Change in Bowel Habits, Chronic diarrhea, Constipation, Difficulty Swallowing, Excessive gas, Gets full quickly at meals, Hemorrhoids, Indigestion, Nausea, Rectal Pain and Vomiting. Female Genitourinary Not Present- Frequency, Nocturia, Painful Urination, Pelvic Pain and Urgency. Musculoskeletal Not Present- Back Pain, Joint Pain, Joint Stiffness, Muscle Pain, Muscle Weakness and Swelling of Extremities. Neurological Not Present- Decreased Memory, Fainting, Headaches, Numbness, Seizures, Tingling, Tremor, Trouble walking and Weakness. Psychiatric Not Present- Anxiety, Bipolar, Change in Sleep Pattern, Depression, Fearful and Frequent crying. Endocrine Not Present- Cold Intolerance, Excessive Hunger, Hair Changes, Heat Intolerance, Hot flashes and New Diabetes. Hematology Present- Easy Bruising. Not Present- Blood Thinners, Excessive bleeding, Gland problems, HIV and Persistent Infections.  Vitals Heather Klein MD; 12/14/2018 12:05 PM) 12/14/2018 12:05 PM Weight: 131.8 lb Height: 62in Body Surface Area: 1.6 m Body Mass Index: 24.11 kg/m  Temp.: 98.72F  Pulse: 88 (Regular)  Resp.: 18 (Unlabored)  BP: 142/83 (Sitting, Left Arm, Standard)       Physical Exam Heather Klein MD; 12/14/2018 12:07 PM) General Mental Status-Alert. General Appearance-Consistent with stated age. Hydration-Well hydrated. Voice-Normal.  Head and Neck Head-normocephalic, atraumatic with no lesions or palpable masses. Trachea-midline. Thyroid Gland Characteristics - normal size and consistency.  Eye Eyeball - Bilateral-Extraocular movements  intact. Sclera/Conjunctiva - Bilateral-No scleral icterus.  Chest and Lung Exam Chest  and lung exam reveals -quiet, even and easy respiratory effort with no use of accessory muscles and on auscultation, normal breath sounds, no adventitious sounds and normal vocal resonance. Inspection Chest Wall - Normal. Back - normal.  Breast Note: breasts relatively symmetric. mild ptosis. left breast with palpable mass at inframammary fold around 5-6 o'clock. It seems to be around 1x 2 cm. There is bruising and tenderness in this location. She has no palpable LAD. no nipple retraction or skin dimpling is present. Right breast is normal. Both breasts are relatively dense.   Cardiovascular Cardiovascular examination reveals -normal heart sounds, regular rate and rhythm with no murmurs and normal pedal pulses bilaterally.  Abdomen Inspection Inspection of the abdomen reveals - No Hernias. Palpation/Percussion Palpation and Percussion of the abdomen reveal - Soft, Non Tender, No Rebound tenderness, No Rigidity (guarding) and No hepatosplenomegaly. Auscultation Auscultation of the abdomen reveals - Bowel sounds normal.  Neurologic Neurologic evaluation reveals -alert and oriented x 3 with no impairment of recent or remote memory. Mental Status-Normal.  Musculoskeletal Global Assessment -Note: no gross deformities.  Normal Exam - Left-Upper Extremity Strength Normal and Lower Extremity Strength Normal. Normal Exam - Right-Upper Extremity Strength Normal and Lower Extremity Strength Normal.  Lymphatic Head & Neck  General Head & Neck Lymphatics: Bilateral - Description - Normal. Axillary  General Axillary Region: Bilateral - Description - Normal. Tenderness - Non Tender. Femoral & Inguinal  Generalized Femoral & Inguinal Lymphatics: Bilateral - Description - No Generalized lymphadenopathy.    Assessment & Plan Heather Klein MD; 12/14/2018 12:11 PM) MALIGNANT NEOPLASM OF  LOWER-OUTER QUADRANT OF LEFT BREAST OF FEMALE, ESTROGEN RECEPTOR NEGATIVE (C50.512) Impression: Pt has a new diagnosis of cT1-2N1 triple negative left breast cancer. Will plan port placement and neoadjuvant chemotherapy followed by lumpectomy and hopefully targeted Ln dissection. This would then be followed by external beam radiation.  We will get an MR given dense breasts and triple negative nature of tumor.  Will also get genetic counseling and testing. I reviewed port placement with risks and recovery/restrictions. Will try to place it the day before chemo. Discussed risk of pneumothorax.  I also discussed seed localized lumpectomy with seed targeted node/sentinel lymph node biopsy. Questions answered and timeline reviewed.    Signed electronically by Heather Klein, MD (12/14/2018 12:12 PM)

## 2018-12-21 NOTE — Progress Notes (Signed)
  Echocardiogram 2D Echocardiogram has been performed.  Heather Hebert 12/21/2018, 11:40 AM

## 2018-12-22 ENCOUNTER — Other Ambulatory Visit: Payer: Self-pay

## 2018-12-22 ENCOUNTER — Ambulatory Visit (HOSPITAL_COMMUNITY): Payer: Commercial Managed Care - PPO

## 2018-12-22 ENCOUNTER — Ambulatory Visit (HOSPITAL_BASED_OUTPATIENT_CLINIC_OR_DEPARTMENT_OTHER): Payer: Commercial Managed Care - PPO | Admitting: Anesthesiology

## 2018-12-22 ENCOUNTER — Encounter (HOSPITAL_BASED_OUTPATIENT_CLINIC_OR_DEPARTMENT_OTHER): Payer: Self-pay

## 2018-12-22 ENCOUNTER — Encounter (HOSPITAL_BASED_OUTPATIENT_CLINIC_OR_DEPARTMENT_OTHER)
Admission: RE | Disposition: A | Discharge status: Home / Self Care | Payer: Self-pay | Source: Home / Self Care | Attending: General Surgery

## 2018-12-22 ENCOUNTER — Ambulatory Visit (HOSPITAL_BASED_OUTPATIENT_CLINIC_OR_DEPARTMENT_OTHER)
Admission: RE | Admit: 2018-12-22 | Discharge: 2018-12-22 | Disposition: A | Discharge status: Home / Self Care | Payer: Commercial Managed Care - PPO | Attending: General Surgery | Admitting: General Surgery

## 2018-12-22 DIAGNOSIS — Z171 Estrogen receptor negative status [ER-]: Secondary | ICD-10-CM | POA: Diagnosis not present

## 2018-12-22 DIAGNOSIS — Z8349 Family history of other endocrine, nutritional and metabolic diseases: Secondary | ICD-10-CM | POA: Insufficient documentation

## 2018-12-22 DIAGNOSIS — Z823 Family history of stroke: Secondary | ICD-10-CM | POA: Diagnosis not present

## 2018-12-22 DIAGNOSIS — Z833 Family history of diabetes mellitus: Secondary | ICD-10-CM | POA: Insufficient documentation

## 2018-12-22 DIAGNOSIS — C50512 Malignant neoplasm of lower-outer quadrant of left female breast: Secondary | ICD-10-CM | POA: Insufficient documentation

## 2018-12-22 DIAGNOSIS — Z8249 Family history of ischemic heart disease and other diseases of the circulatory system: Secondary | ICD-10-CM | POA: Diagnosis not present

## 2018-12-22 DIAGNOSIS — Z86010 Personal history of colonic polyps: Secondary | ICD-10-CM | POA: Insufficient documentation

## 2018-12-22 DIAGNOSIS — Z87891 Personal history of nicotine dependence: Secondary | ICD-10-CM | POA: Insufficient documentation

## 2018-12-22 DIAGNOSIS — Z820 Family history of epilepsy and other diseases of the nervous system: Secondary | ICD-10-CM | POA: Insufficient documentation

## 2018-12-22 DIAGNOSIS — G43909 Migraine, unspecified, not intractable, without status migrainosus: Secondary | ICD-10-CM | POA: Insufficient documentation

## 2018-12-22 DIAGNOSIS — Z841 Family history of disorders of kidney and ureter: Secondary | ICD-10-CM | POA: Diagnosis not present

## 2018-12-22 DIAGNOSIS — Z419 Encounter for procedure for purposes other than remedying health state, unspecified: Secondary | ICD-10-CM

## 2018-12-22 HISTORY — PX: PORTACATH PLACEMENT: SHX2246

## 2018-12-22 SURGERY — INSERTION, TUNNELED CENTRAL VENOUS DEVICE, WITH PORT
Anesthesia: General | Site: Chest | Laterality: Left

## 2018-12-22 MED ORDER — LIDOCAINE 2% (20 MG/ML) 5 ML SYRINGE
INTRAMUSCULAR | Status: DC | PRN
  Administered 2018-12-22: 60 mg via INTRAVENOUS

## 2018-12-22 MED ORDER — ONDANSETRON HCL 4 MG/2ML IJ SOLN
INTRAMUSCULAR | Status: DC | PRN
  Administered 2018-12-22: 4 mg via INTRAVENOUS

## 2018-12-22 MED ORDER — MIDAZOLAM HCL 2 MG/2ML IJ SOLN
INTRAMUSCULAR | Status: AC
  Filled 2018-12-22: qty 2

## 2018-12-22 MED ORDER — OXYCODONE HCL 5 MG/5ML PO SOLN: 5.0000 mg | Freq: Once | ORAL | Status: DC | PRN

## 2018-12-22 MED ORDER — HEPARIN (PORCINE) IN NACL 2-0.9 UNITS/ML
INTRAMUSCULAR | Status: AC | PRN
  Administered 2018-12-22: 1 via INTRAVENOUS

## 2018-12-22 MED ORDER — HEPARIN SOD (PORK) LOCK FLUSH 100 UNIT/ML IV SOLN
INTRAVENOUS | Status: DC | PRN
  Administered 2018-12-22: 500 [IU] via INTRAVENOUS

## 2018-12-22 MED ORDER — LACTATED RINGERS IV SOLN
INTRAVENOUS | Status: DC
  Administered 2018-12-22: 14:00:00 via INTRAVENOUS

## 2018-12-22 MED ORDER — FENTANYL CITRATE (PF) 100 MCG/2ML IJ SOLN
INTRAMUSCULAR | Status: AC
  Filled 2018-12-22: qty 2

## 2018-12-22 MED ORDER — MIDAZOLAM HCL 2 MG/2ML IJ SOLN
1.0000 mg | INTRAMUSCULAR | Status: DC | PRN
  Administered 2018-12-22: 1 mg via INTRAVENOUS

## 2018-12-22 MED ORDER — CEFAZOLIN SODIUM-DEXTROSE 2-4 GM/100ML-% IV SOLN
INTRAVENOUS | Status: AC
  Filled 2018-12-22: qty 100

## 2018-12-22 MED ORDER — CHLORHEXIDINE GLUCONATE CLOTH 2 % EX PADS: 6.0000 | MEDICATED_PAD | Freq: Once | CUTANEOUS | Status: DC

## 2018-12-22 MED ORDER — KETOROLAC TROMETHAMINE 30 MG/ML IJ SOLN: 30.0000 mg | Freq: Once | INTRAMUSCULAR | Status: DC | PRN

## 2018-12-22 MED ORDER — HYDROMORPHONE HCL 1 MG/ML IJ SOLN: 0.2500 mg | INTRAMUSCULAR | Status: DC | PRN

## 2018-12-22 MED ORDER — PROPOFOL 10 MG/ML IV BOLUS
INTRAVENOUS | Status: AC
  Filled 2018-12-22: qty 20

## 2018-12-22 MED ORDER — CEFAZOLIN SODIUM-DEXTROSE 2-4 GM/100ML-% IV SOLN
2.0000 g | INTRAVENOUS | Status: AC
  Administered 2018-12-22: 2 g via INTRAVENOUS

## 2018-12-22 MED ORDER — OXYCODONE HCL 5 MG PO TABS: 5.0000 mg | ORAL_TABLET | Freq: Four times a day (QID) | ORAL | 0 refills | Status: DC | PRN

## 2018-12-22 MED ORDER — MEPERIDINE HCL 25 MG/ML IJ SOLN: 6.2500 mg | INTRAMUSCULAR | Status: DC | PRN

## 2018-12-22 MED ORDER — FENTANYL CITRATE (PF) 100 MCG/2ML IJ SOLN
50.0000 ug | INTRAMUSCULAR | Status: DC | PRN
  Administered 2018-12-22: 50 ug via INTRAVENOUS

## 2018-12-22 MED ORDER — LIDOCAINE-EPINEPHRINE (PF) 1 %-1:200000 IJ SOLN
INTRAMUSCULAR | Status: DC | PRN
  Administered 2018-12-22: 5 mL

## 2018-12-22 MED ORDER — BUPIVACAINE HCL (PF) 0.25 % IJ SOLN
INTRAMUSCULAR | Status: DC | PRN
  Administered 2018-12-22: 5 mL

## 2018-12-22 MED ORDER — ACETAMINOPHEN 500 MG PO TABS
ORAL_TABLET | ORAL | Status: AC
  Filled 2018-12-22: qty 2

## 2018-12-22 MED ORDER — PROPOFOL 500 MG/50ML IV EMUL
INTRAVENOUS | Status: DC | PRN
  Administered 2018-12-22: 25 ug/kg/min via INTRAVENOUS

## 2018-12-22 MED ORDER — DEXAMETHASONE SODIUM PHOSPHATE 4 MG/ML IJ SOLN
INTRAMUSCULAR | Status: DC | PRN
  Administered 2018-12-22: 10 mg via INTRAVENOUS

## 2018-12-22 MED ORDER — PROPOFOL 10 MG/ML IV BOLUS
INTRAVENOUS | Status: DC | PRN
  Administered 2018-12-22: 150 mg via INTRAVENOUS
  Administered 2018-12-22: 50 mg via INTRAVENOUS

## 2018-12-22 MED ORDER — ACETAMINOPHEN 500 MG PO TABS
1000.0000 mg | ORAL_TABLET | ORAL | Status: AC
  Administered 2018-12-22: 1000 mg via ORAL

## 2018-12-22 MED ORDER — PROMETHAZINE HCL 25 MG/ML IJ SOLN: 6.2500 mg | INTRAMUSCULAR | Status: DC | PRN

## 2018-12-22 MED ORDER — OXYCODONE HCL 5 MG PO TABS: 5.0000 mg | ORAL_TABLET | Freq: Once | ORAL | Status: DC | PRN

## 2018-12-22 SURGICAL SUPPLY — 45 items
BAG DECANTER FOR FLEXI CONT (MISCELLANEOUS) ×3 IMPLANT
BLADE HEX COATED 2.75 (ELECTRODE) ×3 IMPLANT
BLADE SURG 11 STRL SS (BLADE) ×3 IMPLANT
BLADE SURG 15 STRL LF DISP TIS (BLADE) ×1 IMPLANT
BLADE SURG 15 STRL SS (BLADE) ×2
CHLORAPREP W/TINT 26 (MISCELLANEOUS) ×3 IMPLANT
COVER BACK TABLE REUSABLE LG (DRAPES) ×3 IMPLANT
COVER MAYO STAND REUSABLE (DRAPES) ×3 IMPLANT
COVER WAND RF STERILE (DRAPES) IMPLANT
DECANTER SPIKE VIAL GLASS SM (MISCELLANEOUS) ×3 IMPLANT
DERMABOND ADVANCED (GAUZE/BANDAGES/DRESSINGS) ×2
DERMABOND ADVANCED .7 DNX12 (GAUZE/BANDAGES/DRESSINGS) ×1 IMPLANT
DRAPE C-ARM 42X72 X-RAY (DRAPES) ×3 IMPLANT
DRAPE LAPAROTOMY TRNSV 102X78 (DRAPES) ×3 IMPLANT
DRAPE UTILITY XL STRL (DRAPES) ×3 IMPLANT
DRSG TEGADERM 4X4.75 (GAUZE/BANDAGES/DRESSINGS) ×6 IMPLANT
ELECT REM PT RETURN 9FT ADLT (ELECTROSURGICAL) ×3
ELECTRODE REM PT RTRN 9FT ADLT (ELECTROSURGICAL) ×1 IMPLANT
GAUZE SPONGE 4X4 12PLY STRL LF (GAUZE/BANDAGES/DRESSINGS) ×3 IMPLANT
GLOVE BIO SURGEON STRL SZ 6 (GLOVE) ×3 IMPLANT
GLOVE BIO SURGEON STRL SZ7 (GLOVE) ×3 IMPLANT
GLOVE BIOGEL PI IND STRL 6.5 (GLOVE) ×1 IMPLANT
GLOVE BIOGEL PI IND STRL 7.0 (GLOVE) ×1 IMPLANT
GLOVE BIOGEL PI IND STRL 7.5 (GLOVE) ×1 IMPLANT
GLOVE BIOGEL PI INDICATOR 6.5 (GLOVE) ×2
GLOVE BIOGEL PI INDICATOR 7.0 (GLOVE) ×2
GLOVE BIOGEL PI INDICATOR 7.5 (GLOVE) ×2
GOWN STRL REUS W/ TWL LRG LVL3 (GOWN DISPOSABLE) ×1 IMPLANT
GOWN STRL REUS W/TWL 2XL LVL3 (GOWN DISPOSABLE) ×3 IMPLANT
GOWN STRL REUS W/TWL LRG LVL3 (GOWN DISPOSABLE) ×2
IV CONNECTOR ONE LINK NDLESS (IV SETS) ×6 IMPLANT
KIT PORT POWER 8FR ISP CVUE (Port) ×3 IMPLANT
NEEDLE HYPO 25X1 1.5 SAFETY (NEEDLE) ×3 IMPLANT
PACK BASIN DAY SURGERY FS (CUSTOM PROCEDURE TRAY) ×3 IMPLANT
PENCIL SMOKE EVACUATOR (MISCELLANEOUS) ×3 IMPLANT
SLEEVE SCD COMPRESS KNEE MED (MISCELLANEOUS) ×3 IMPLANT
SUT MNCRL AB 4-0 PS2 18 (SUTURE) ×3 IMPLANT
SUT PROLENE 2 0 SH DA (SUTURE) ×6 IMPLANT
SUT VIC AB 3-0 SH 27 (SUTURE) ×2
SUT VIC AB 3-0 SH 27X BRD (SUTURE) ×1 IMPLANT
SUT VICRYL 3-0 CR8 SH (SUTURE) IMPLANT
SYR 10ML LL (SYRINGE) ×3 IMPLANT
SYR 5ML LUER SLIP (SYRINGE) ×3 IMPLANT
SYR CONTROL 10ML LL (SYRINGE) ×3 IMPLANT
TOWEL GREEN STERILE FF (TOWEL DISPOSABLE) ×3 IMPLANT

## 2018-12-22 NOTE — Anesthesia Preprocedure Evaluation (Addendum)
Anesthesia Evaluation  Patient identified by MRN, date of birth, ID band Patient awake    Reviewed: Allergy & Precautions, H&P , NPO status , Patient's Chart, lab work & pertinent test results  Airway Mallampati: II  TM Distance: >3 FB Neck ROM: Full    Dental no notable dental hx. (+) Teeth Intact, Dental Advisory Given   Pulmonary neg pulmonary ROS, former smoker,    Pulmonary exam normal breath sounds clear to auscultation       Cardiovascular negative cardio ROS   Rhythm:Regular Rate:Normal     Neuro/Psych negative neurological ROS  negative psych ROS   GI/Hepatic negative GI ROS, Neg liver ROS,   Endo/Other  negative endocrine ROS  Renal/GU negative Renal ROS   Breast ca    Musculoskeletal negative musculoskeletal ROS (+)   Abdominal Normal abdominal exam  (+)   Peds  Hematology negative hematology ROS (+)   Anesthesia Other Findings   Reproductive/Obstetrics negative OB ROS                            Anesthesia Physical  Anesthesia Plan  ASA: II  Anesthesia Plan: General   Post-op Pain Management:    Induction: Intravenous  PONV Risk Score and Plan: 2 and Dexamethasone, Ondansetron and Treatment may vary due to age or medical condition  Airway Management Planned: LMA  Additional Equipment: None  Intra-op Plan:   Post-operative Plan: Extubation in OR  Informed Consent: I have reviewed the patients History and Physical, chart, labs and discussed the procedure including the risks, benefits and alternatives for the proposed anesthesia with the patient or authorized representative who has indicated his/her understanding and acceptance.     Dental advisory given  Plan Discussed with: CRNA  Anesthesia Plan Comments:         Anesthesia Quick Evaluation

## 2018-12-22 NOTE — Anesthesia Procedure Notes (Signed)
Procedure Name: LMA Insertion Date/Time: 12/22/2018 3:32 PM Performed by: Willa Frater, CRNA Pre-anesthesia Checklist: Patient identified, Emergency Drugs available, Suction available and Patient being monitored Patient Re-evaluated:Patient Re-evaluated prior to induction Oxygen Delivery Method: Circle system utilized Preoxygenation: Pre-oxygenation with 100% oxygen Induction Type: IV induction Ventilation: Mask ventilation without difficulty LMA: LMA inserted LMA Size: 3.0 Number of attempts: 1 Airway Equipment and Method: Bite block Placement Confirmation: positive ETCO2 Tube secured with: Tape Dental Injury: Teeth and Oropharynx as per pre-operative assessment

## 2018-12-22 NOTE — Discharge Instructions (Addendum)
[facility] Office Phone Number [phone]   POST OP INSTRUCTIONS  Always review your discharge instruction sheet given to you by the facility where your surgery was performed.  IF YOU HAVE DISABILITY OR FAMILY LEAVE FORMS, YOU MUST BRING THEM TO THE OFFICE FOR PROCESSING.  DO NOT GIVE THEM TO YOUR DOCTOR.  1. A prescription for pain medication may be given to you upon discharge.  Take your pain medication as prescribed, if needed.  If narcotic pain medicine is not needed, then you may take acetaminophen (Tylenol) or ibuprofen (Advil) as needed. 2. Take your usually prescribed medications unless otherwise directed 3. If you need a refill on your pain medication, please contact your pharmacy.  They will contact our office to request authorization.  Prescriptions will not be filled after 5pm or on week-ends. 4. You should eat very light the first 24 hours after surgery, such as soup, crackers, pudding, etc.  Resume your normal diet the day after surgery 5. It is common to experience some constipation if taking pain medication after surgery.  Increasing fluid intake and taking a stool softener will usually help or prevent this problem from occurring.  A mild laxative (Milk of Magnesia or Miralax) should be taken according to package directions if there are no bowel movements after 48 hours. 6. You may shower in 48 hours.  The surgical glue will flake off in 2-3 weeks.   7. ACTIVITIES:  No strenuous activity or heavy lifting for 1 week.   a. You may drive when you no longer are taking prescription pain medication, you can comfortably wear a seatbelt, and you can safely maneuver your car and apply brakes. b. RETURN TO WORK:  __________to be determined._______________ Dennis Bast should see your doctor in the office for a follow-up appointment approximately three-four weeks after your surgery.    WHEN TO CALL YOUR DOCTOR: 1. Fever over 101.0 2. Nausea and/or vomiting. 3. Extreme swelling  or bruising. 4. Continued bleeding from incision. 5. Increased pain, redness, or drainage from the incision.  The clinic staff is available to answer your questions during regular business hours.  Please dont hesitate to call and ask to speak to one of the nurses for clinical concerns.  If you have a medical emergency, go to the nearest emergency room or call 911.  A surgeon from [facility] is always on call at the hospital.  For further questions, please visit [email or web address]     No Tylenol until 7:30 PM on 12/22/2018.   Post Anesthesia Home Care Instructions  Activity: Get plenty of rest for the remainder of the day. A responsible individual must stay with you for 24 hours following the procedure.  For the next 24 hours, DO NOT: -Drive a car -Paediatric nurse -Drink alcoholic beverages -Take any medication unless instructed by your physician -Make any legal decisions or sign important papers.  Meals: Start with liquid foods such as gelatin or soup. Progress to regular foods as tolerated. Avoid greasy, spicy, heavy foods. If nausea and/or vomiting occur, drink only clear liquids until the nausea and/or vomiting subsides. Call your physician if vomiting continues.  Special Instructions/Symptoms: Your throat may feel dry or sore from the anesthesia or the breathing tube placed in your throat during surgery. If this causes discomfort, gargle with warm salt water. The discomfort should disappear within 24 hours.  If you had a scopolamine patch placed behind your ear for the management of post- operative nausea and/or vomiting:  1. The medication in  the patch is effective for 72 hours, after which it should be removed.  Wrap patch in a tissue and discard in the trash. Wash hands thoroughly with soap and water. 2. You may remove the patch earlier than 72 hours if you experience unpleasant side effects which may include dry mouth, dizziness or visual  disturbances. 3. Avoid touching the patch. Wash your hands with soap and water after contact with the patch.

## 2018-12-22 NOTE — Anesthesia Postprocedure Evaluation (Signed)
Anesthesia Post Note  Patient: Heather Hebert  Procedure(s) Performed: INSERTION PORT-A-CATH (Left Chest)     Patient location during evaluation: PACU Anesthesia Type: General Level of consciousness: awake and alert, oriented and patient cooperative Pain management: pain level controlled Vital Signs Assessment: post-procedure vital signs reviewed and stable Respiratory status: spontaneous breathing, nonlabored ventilation and respiratory function stable Cardiovascular status: blood pressure returned to baseline and stable Postop Assessment: no apparent nausea or vomiting Anesthetic complications: no    Last Vitals:  Vitals:   12/22/18 1630 12/22/18 1645  BP: 131/75 129/73  Pulse: 73 63  Resp: 15 12  Temp:    SpO2: 100% 100%    Last Pain:  Vitals:   12/22/18 1645  TempSrc:   PainSc: 0-No pain                 Pervis Hocking

## 2018-12-22 NOTE — Transfer of Care (Signed)
Immediate Anesthesia Transfer of Care Note  Patient: Cherrish Deitering  Procedure(s) Performed: INSERTION PORT-A-CATH (Left Chest)  Patient Location: PACU  Anesthesia Type:General  Level of Consciousness: drowsy and patient cooperative  Airway & Oxygen Therapy: Patient Spontanous Breathing and Patient connected to face mask oxygen  Post-op Assessment: Report given to RN and Post -op Vital signs reviewed and stable  Post vital signs: Reviewed and stable  Last Vitals:  Vitals Value Taken Time  BP 121/66 12/22/18 1615  Temp    Pulse 83 12/22/18 1618  Resp 13 12/22/18 1618  SpO2 100 % 12/22/18 1618  Vitals shown include unvalidated device data.  Last Pain:  Vitals:   12/22/18 1326  TempSrc: Oral  PainSc: 0-No pain      Patients Stated Pain Goal: 2 ([id number])  Complications: No apparent anesthesia complications

## 2018-12-22 NOTE — Progress Notes (Signed)
Patient Care Team: [name], FNP as PCP - General (Family Medicine) Mauro Kaufmann, RN as Oncology Nurse Navigator Rockwell Germany, RN as Oncology Nurse Navigator  DIAGNOSIS:    ICD-10-CM   1. 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 Mammogram showed a 1.4cm mass at the 5 o'clock position in the left breast, a left axillary lymph node measuring 2.8cm, and 2 smaller axillary lymph nodes with cortical thickening. 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   12/14/2018 Cancer Staging   Staging form: Breast, AJCC 8th Edition - Clinical: Stage IIB (cT1c, cN1, cM0, G3, ER-, PR-, HER2-) - Signed by Nicholas Lose, MD on 12/14/2018   12/23/2018 -  Chemotherapy   The patient had DOXOrubicin (ADRIAMYCIN) chemo injection 98 mg, 60 mg/m2 = 98 mg, Intravenous,  Once, 0 of 4 cycles palonosetron (ALOXI) injection 0.25 mg, 0.25 mg, Intravenous,  Once, 0 of 8 cycles pegfilgrastim-jmdb (FULPHILA) injection 6 mg, 6 mg, Subcutaneous,  Once, 0 of 4 cycles CARBOplatin (PARAPLATIN) 540 mg in sodium chloride 0.9 % 250 mL chemo infusion, 540 mg (100 % of original dose 544.2 mg), Intravenous,  Once, 0 of 4 cycles Dose modification:   (original dose 544.2 mg, Cycle 5) cyclophosphamide (CYTOXAN) 980 mg in sodium chloride 0.9 % 250 mL chemo infusion, 600 mg/m2 = 980 mg, Intravenous,  Once, 0 of 4 cycles PACLitaxel (TAXOL) 132 mg in sodium chloride 0.9 % 250 mL chemo infusion (</= 95m/m2), 80 mg/m2 = 132 mg, Intravenous,  Once, 0 of 4 cycles fosaprepitant (EMEND) 150 mg, dexamethasone (DECADRON) 12 mg in sodium chloride 0.9 % 145 mL IVPB, , Intravenous,   Once, 0 of 8 cycles  for chemotherapy treatment.      CHIEF COMPLIANT: Cycle 1 Adriamycin and Cytoxan   INTERVAL HISTORY: SAdelie Hebert a 58y.o. 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 She is currently receiving neoadjuvant chemotherapy with dose dense Adriamycin and Cytoxan. Echo on 12/21/18 showed an ejection fraction of 60-65%. Breast MRI on 12/21/18 showed the known 1.5cm mass in the left breast, at least 6 abnormal left axillary lymph nodes, and no right breast malignancy. Her port was placed by Dr. BBarry Dieneson 12/22/18. She presents to the clinic today for cycle 1.   REVIEW OF SYSTEMS:   Constitutional: Denies fevers, chills or abnormal weight loss Eyes: Denies blurriness of vision Ears, nose, mouth, throat, and face: Denies mucositis or sore throat Respiratory: Denies cough, dyspnea or wheezes Cardiovascular: Denies palpitation, chest discomfort Gastrointestinal: Denies nausea, heartburn or change in bowel habits Skin: Denies abnormal skin rashes Lymphatics: Denies new lymphadenopathy or easy bruising Neurological: Denies numbness, tingling or new weaknesses Behavioral/Psych: Mood is stable, no new changes  Extremities: No lower extremity edema Breast: denies any pain or lumps or nodules in either breasts All other systems were reviewed with the patient and are negative.  I have reviewed the past medical history, past surgical history, social history and family history with the patient and they are unchanged from previous note.  ALLERGIES:  has No Known Allergies.  MEDICATIONS:  Current Outpatient Medications  Medication Sig Dispense Refill  .  Calcium Carb-Cholecalciferol (CALCIUM 600 + D PO) Take 1 tablet by mouth 2 (two) times daily.    . cetirizine (ZYRTEC) 10 MG tablet Take 10 mg by mouth daily.    Marland Kitchen dexamethasone (DECADRON) 4 MG tablet Take 1 tablet day after chemo and 1 tablet 2 days after chemo with food 8 tablet 0  . Flax OIL Take by  mouth. One cap per day    . lidocaine-prilocaine (EMLA) cream Apply to affected area once 30 g 3  . LORazepam (ATIVAN) 0.5 MG tablet Take 1 tablet (0.5 mg total) by mouth at bedtime as needed for sleep. 30 tablet 0  . Multiple Vitamin (MULTIVITAMIN WITH MINERALS) TABS tablet Take 1 tablet by mouth daily.    . Olopatadine HCl (PATADAY OP) Place 1 drop into both eyes daily as needed. For dry eyes    . ondansetron (ZOFRAN) 8 MG tablet Take 1 tablet (8 mg total) by mouth 2 (two) times daily as needed. Start on the third day after chemotherapy. 30 tablet 1  . oxyCODONE (OXY IR/ROXICODONE) 5 MG immediate release tablet Take 1 tablet (5 mg total) by mouth every 6 (six) hours as needed for severe pain. 5 tablet 0  . Probiotic Product (PROBIOTIC PO) Take 1 tablet by mouth every other day.     . prochlorperazine (COMPAZINE) 10 MG tablet Take 1 tablet (10 mg total) by mouth every 6 (six) hours as needed (Nausea or vomiting). 30 tablet 1   No current facility-administered medications for this visit.     PHYSICAL EXAMINATION: ECOG PERFORMANCE STATUS: 0 - Asymptomatic  Vitals:   12/23/18 0815  BP: 124/71  Pulse: (!) 59  Resp: 18  Temp: 98.3 F (36.8 C)  SpO2: 99%   Filed Weights   12/23/18 0815  Weight: 133 lb 6.4 oz (60.5 kg)    GENERAL: alert, no distress and comfortable SKIN: skin color, texture, turgor are normal, no rashes or significant lesions EYES: normal, Conjunctiva are pink and non-injected, sclera clear OROPHARYNX: no exudate, no erythema and lips, buccal mucosa, and tongue normal  NECK: supple, thyroid normal size, non-tender, without nodularity LYMPH: no palpable lymphadenopathy in the cervical, axillary or inguinal LUNGS: clear to auscultation and percussion with normal breathing effort HEART: regular rate & rhythm and no murmurs and no lower extremity edema ABDOMEN: abdomen soft, non-tender and normal bowel sounds MUSCULOSKELETAL: no cyanosis of digits and no clubbing   NEURO: alert & oriented x 3 with fluent speech, no focal motor/sensory deficits EXTREMITIES: No lower extremity edema  LABORATORY DATA:  I have reviewed the data as listed CMP Latest Ref Rng & Units 12/14/2018 08/05/2015  Glucose 70 - 99 mg/dL 97 98  BUN 6 - 20 mg/dL 11 10  Creatinine 0.44 - 1.00 mg/dL 0.86 0.90  Sodium 135 - 145 mmol/L 140 134(L)  Potassium 3.5 - 5.1 mmol/L 3.8 4.0  Chloride 98 - 111 mmol/L 102 98(L)  CO2 22 - 32 mmol/L 28 26  Calcium 8.9 - 10.3 mg/dL 9.9 9.6  Total Protein 6.5 - 8.1 g/dL 8.2(H) 8.3(H)  Total Bilirubin 0.3 - 1.2 mg/dL 0.8 1.1  Alkaline Phos 38 - 126 U/L 90 108  AST 15 - 41 U/L 18 30  ALT 0 - 44 U/L 10 23    Lab Results  Component Value Date   WBC 6.9 12/23/2018   HGB 12.0 12/23/2018   HCT 35.8 (L) 12/23/2018   MCV 92.3 12/23/2018   PLT 188 12/23/2018   NEUTROABS 5.2 12/23/2018  ASSESSMENT & PLAN:  Malignant neoplasm of lower-outer quadrant of left breast of female, estrogen receptor negative ([place]) 12/07/2018:Patient palpated a lump in the left breast. Mammogram showed a 1.4cm mass at the 5 o'clock position in the left breast, a left axillary lymph node measuring 2.8cm, and 2 smaller axillary lymph nodes with cortical thickening. 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 Neoadjuvant chemotherapy with Adriamycin and Cytoxan dose dense 4 followed by Taxol weekly 12 with carboplatin every 3 weeks 2. Followed by breast conserving surgery with axillary lymph node dissection (6 lymph nodes were found on MRI) 3. Followed by adjuvant radiation therapy  Breast MRI on 12/21/18 showed the known 1.5cm mass in the left breast, at least 6 abnormal left axillary lymph nodes, and no right breast malignancy -------------------------------------------------------------------------------------------------------------------------------------------------- Current treatment: Cycle 1  day 1 dose dense Adriamycin and Cytoxan Echocardiogram: 12/21/2018: EF 60 to 65% Labs reviewed Chemo consent obtained chemo education completed Antiemetics were reviewed Return to clinic in 1 week for toxicity check  No orders of the defined types were placed in this encounter.  The patient has a good understanding of the overall plan. she agrees with it. she will call with any problems that may develop before the next visit here.  Nicholas Lose, MD 12/23/2018  Julious Oka Dorshimer am acting as scribe for Dr. Nicholas Lose.  I have reviewed the above documentation for accuracy and completeness, and I agree with the above.

## 2018-12-22 NOTE — Interval H&P Note (Signed)
History and Physical Interval Note:  12/22/2018 1:24 PM  Heather Hebert  has presented today for surgery, with the diagnosis of LEFT BREAST CANCER.  The various methods of treatment have been discussed with the patient and family. After consideration of risks, benefits and other options for treatment, the patient has consented to  Procedure(s): INSERTION PORT-A-CATH WITH POSSIBLE ULTRASOUND (N/A) as a surgical intervention.  The patient's history has been reviewed, patient examined, no change in status, stable for surgery.  I have reviewed the patient's chart and labs.  Questions were answered to the patient's satisfaction.     [name]

## 2018-12-22 NOTE — Op Note (Signed)
PREOPERATIVE DIAGNOSIS:  Left breast cancer     POSTOPERATIVE DIAGNOSIS:  Same     PROCEDURE: Left subclavian port placement, Bard ClearVue  Power Port, MRI safe, 8-French.      SURGEON:  [name], MD      ANESTHESIA:  General   FINDINGS:  Good venous return, easy flush, and tip of the catheter and   SVC 21 cm.      SPECIMEN:  None.      ESTIMATED BLOOD LOSS:  Minimal.      COMPLICATIONS:  None known.      PROCEDURE:  Pt was identified in the holding area and taken to   the operating room, where patient was placed supine on the operating room   table.  General anesthesia was induced.  Patient's arms were tucked and the upper   chest and neck were prepped and draped in sterile fashion.  Time-out was   performed according to the surgical safety check list.  When all was   correct, we continued.   Local anesthetic was administered over this   area at the angle of the clavicle.  The vein was accessed with 1 pass(es) of the needle. There was good venous return and the wire passed easily with no ectopy.   Fluoroscopy was used to confirm that the wire was in the vena cava.      The patient was placed back level and the area for the pocket was anethetized   with local anesthetic.  A 3-cm transverse incision was made with a #15   blade.  Cautery was used to divide the subcutaneous tissues down to the   pectoralis muscle.  An [employer] retractor was used to elevate the skin   while a pocket was created on top of the pectoralis fascia.  The port   was placed into the pocket to confirm that it was of adequate size.  The   catheter was preattached to the port.  The port was then secured to the   pectoralis fascia with four 2-0 Prolene sutures.  These were clamped and   not tied down yet.    The catheter was tunneled through to the wire exit   site.  The catheter was placed along the wire to determine what length it should be to be in the SVC.  The catheter was cut at 21 cm.  The  tunneler sheath and dilator were passed over the wire and the dilator and wire were removed.  The catheter was advanced through the tunneler sheath and the tunneler sheath was pulled away.  Care was taken to keep the catheter in the tunneler sheath as this occurred. This was advanced and the tunneler sheath was removed.  There was good venous   return and easy flush of the catheter.  The Prolene sutures were tied   down to the pectoral fascia.  The skin was reapproximated using 3-0   Vicryl interrupted deep dermal sutures.    Fluoroscopy was used to re-confirm good position of the catheter.  The skin   was then closed using 4-0 Monocryl in a subcuticular fashion.  The port was flushed with concentrated heparin flush as well.  The wounds were then cleaned, dried, and dressed with Dermabond.  The patient was awakened from anesthesia and taken to the PACU in stable condition.  Needle, sponge, and instrument counts were correct.               [name], MD

## 2018-12-23 ENCOUNTER — Inpatient Hospital Stay (HOSPITAL_BASED_OUTPATIENT_CLINIC_OR_DEPARTMENT_OTHER): Payer: Commercial Managed Care - PPO | Admitting: Hematology and Oncology

## 2018-12-23 ENCOUNTER — Encounter (HOSPITAL_BASED_OUTPATIENT_CLINIC_OR_DEPARTMENT_OTHER): Payer: Self-pay | Admitting: General Surgery

## 2018-12-23 ENCOUNTER — Inpatient Hospital Stay: Payer: Commercial Managed Care - PPO

## 2018-12-23 ENCOUNTER — Encounter: Payer: Self-pay | Admitting: *Deleted

## 2018-12-23 ENCOUNTER — Other Ambulatory Visit: Payer: Self-pay

## 2018-12-23 DIAGNOSIS — C50512 Malignant neoplasm of lower-outer quadrant of left female breast: Secondary | ICD-10-CM

## 2018-12-23 DIAGNOSIS — Z171 Estrogen receptor negative status [ER-]: Secondary | ICD-10-CM | POA: Diagnosis not present

## 2018-12-23 DIAGNOSIS — Z95828 Presence of other vascular implants and grafts: Secondary | ICD-10-CM

## 2018-12-23 LAB — CMP (CANCER CENTER ONLY)
ALT: 11 U/L (ref 0–44)
AST: 20 U/L (ref 15–41)
Albumin: 4 g/dL (ref 3.5–5.0)
Alkaline Phosphatase: 76 U/L (ref 38–126)
Anion gap: 12 (ref 5–15)
BUN: 11 mg/dL (ref 6–20)
CO2: 24 mmol/L (ref 22–32)
Calcium: 9.2 mg/dL (ref 8.9–10.3)
Chloride: 105 mmol/L (ref 98–111)
Creatinine: 0.85 mg/dL (ref 0.44–1.00)
GFR, Est AFR Am: >60 mL/min (ref >60)
GFR, Estimated: >60 mL/min (ref >60)
Glucose, Bld: 210 mg/dL — ABNORMAL HIGH (ref 70–99)
Potassium: 4 mmol/L (ref 3.5–5.1)
Sodium: 141 mmol/L (ref 135–145)
Total Bilirubin: 0.4 mg/dL (ref 0.3–1.2)
Total Protein: 7.5 g/dL (ref 6.5–8.1)

## 2018-12-23 LAB — CBC WITH DIFFERENTIAL (CANCER CENTER ONLY)
Abs Immature Granulocytes: 0.03 10*3/uL (ref 0.00–0.07)
Basophils Absolute: 0 10*3/uL (ref 0.0–0.1)
Basophils Relative: 0 %
Eosinophils Absolute: 0 10*3/uL (ref 0.0–0.5)
Eosinophils Relative: 0 %
HCT: 35.8 % — ABNORMAL LOW (ref 36.0–46.0)
Hemoglobin: 12 g/dL (ref 12.0–15.0)
Immature Granulocytes: 0 %
Lymphocytes Relative: 19 %
Lymphs Abs: 1.3 10*3/uL (ref 0.7–4.0)
MCH: 30.9 pg (ref 26.0–34.0)
MCHC: 33.5 g/dL (ref 30.0–36.0)
MCV: 92.3 fL (ref 80.0–100.0)
Monocytes Absolute: 0.4 10*3/uL (ref 0.1–1.0)
Monocytes Relative: 6 %
Neutro Abs: 5.2 10*3/uL (ref 1.7–7.7)
Neutrophils Relative %: 75 %
Platelet Count: 188 10*3/uL (ref 150–400)
RBC: 3.88 MIL/uL (ref 3.87–5.11)
RDW: 12.6 % (ref 11.5–15.5)
WBC Count: 6.9 10*3/uL (ref 4.0–10.5)
nRBC: 0 % (ref 0.0–0.2)

## 2018-12-23 MED ORDER — DOXORUBICIN HCL CHEMO IV INJECTION 2 MG/ML
60.0000 mg/m2 | Freq: Once | INTRAVENOUS | Status: AC
  Administered 2018-12-23: 98 mg via INTRAVENOUS
  Filled 2018-12-23: qty 49

## 2018-12-23 MED ORDER — SODIUM CHLORIDE 0.9 % IV SOLN
Freq: Once | INTRAVENOUS | Status: AC
  Administered 2018-12-23: 09:00:00 via INTRAVENOUS
  Filled 2018-12-23: qty 250

## 2018-12-23 MED ORDER — SODIUM CHLORIDE 0.9% FLUSH
10.0000 mL | INTRAVENOUS | Status: DC | PRN
  Administered 2018-12-23: 10 mL via INTRAVENOUS
  Filled 2018-12-23: qty 10

## 2018-12-23 MED ORDER — SODIUM CHLORIDE 0.9 % IV SOLN
600.0000 mg/m2 | Freq: Once | INTRAVENOUS | Status: AC
  Administered 2018-12-23: 11:00:00 980 mg via INTRAVENOUS
  Filled 2018-12-23: qty 49

## 2018-12-23 MED ORDER — HEPARIN SOD (PORK) LOCK FLUSH 100 UNIT/ML IV SOLN
500.0000 [IU] | Freq: Once | INTRAVENOUS | Status: AC | PRN
  Administered 2018-12-23: 12:00:00 500 [IU]
  Filled 2018-12-23: qty 5

## 2018-12-23 MED ORDER — SODIUM CHLORIDE 0.9% FLUSH
10.0000 mL | INTRAVENOUS | Status: DC | PRN
  Administered 2018-12-23: 12:00:00 10 mL
  Filled 2018-12-23: qty 10

## 2018-12-23 MED ORDER — PALONOSETRON HCL INJECTION 0.25 MG/5ML
INTRAVENOUS | Status: AC
  Filled 2018-12-23: qty 5

## 2018-12-23 MED ORDER — SODIUM CHLORIDE 0.9 % IV SOLN
Freq: Once | INTRAVENOUS | Status: AC
  Administered 2018-12-23: 09:00:00 via INTRAVENOUS
  Filled 2018-12-23: qty 5

## 2018-12-23 MED ORDER — PALONOSETRON HCL INJECTION 0.25 MG/5ML
0.2500 mg | Freq: Once | INTRAVENOUS | Status: AC
  Administered 2018-12-23: 09:00:00 0.25 mg via INTRAVENOUS

## 2018-12-23 NOTE — Patient Instructions (Signed)

## 2018-12-23 NOTE — Progress Notes (Signed)
Patient received first time Adriamycin IV push. Blood return noted before, during, and after medication administration.

## 2018-12-23 NOTE — Patient Instructions (Addendum)
[facility] Discharge Instructions for Patients Receiving Chemotherapy  Today you received the following chemotherapy agents: Adriamycin/Cytoxan.  To help prevent nausea and vomiting after your treatment, we encourage you to take your nausea medication as directed.   If you develop nausea and vomiting that is not controlled by your nausea medication, call the clinic.   BELOW ARE SYMPTOMS THAT SHOULD BE REPORTED IMMEDIATELY:  *FEVER GREATER THAN 100.5 F  *CHILLS WITH OR WITHOUT FEVER  NAUSEA AND VOMITING THAT IS NOT CONTROLLED WITH YOUR NAUSEA MEDICATION  *UNUSUAL SHORTNESS OF BREATH  *UNUSUAL BRUISING OR BLEEDING  TENDERNESS IN MOUTH AND THROAT WITH OR WITHOUT PRESENCE OF ULCERS  *URINARY PROBLEMS  *BOWEL PROBLEMS  UNUSUAL RASH Items with * indicate a potential emergency and should be followed up as soon as possible.  Feel free to call the clinic should you have any questions or concerns. The clinic phone number is [phone].  Please show the [facility] at check-in to the Emergency Department and triage nurse.  Doxorubicin injection What is this medicine? DOXORUBICIN (dox oh ROO bi sin) is a chemotherapy drug. It is used to treat many kinds of cancer like leukemia, lymphoma, neuroblastoma, sarcoma, and Wilms' tumor. It is also used to treat bladder cancer, breast cancer, lung cancer, ovarian cancer, stomach cancer, and thyroid cancer. This medicine may be used for other purposes; ask your health care provider or pharmacist if you have questions. COMMON BRAND NAME(S): Adriamycin, Adriamycin PFS, Adriamycin RDF, Rubex What should I tell my health care provider before I take this medicine? They need to know if you have any of these conditions:  heart disease  history of low blood counts caused by a medicine  liver disease  recent or ongoing radiation therapy  an unusual or allergic reaction to doxorubicin, other chemotherapy agents, other  medicines, foods, dyes, or preservatives  pregnant or trying to get pregnant  breast-feeding How should I use this medicine? This drug is given as an infusion into a vein. It is administered in a hospital or clinic by a specially trained health care professional. If you have pain, swelling, burning or any unusual feeling around the site of your injection, tell your health care professional right away. Talk to your pediatrician regarding the use of this medicine in children. Special care may be needed. Overdosage: If you think you have taken too much of this medicine contact a poison control center or emergency room at once. NOTE: This medicine is only for you. Do not share this medicine with others. What if I miss a dose? It is important not to miss your dose. Call your doctor or health care professional if you are unable to keep an appointment. What may interact with this medicine? This medicine may interact with the following medications:  6-mercaptopurine  paclitaxel  phenytoin  St. John's Wort  trastuzumab  verapamil This list may not describe all possible interactions. Give your health care provider a list of all the medicines, herbs, non-prescription drugs, or dietary supplements you use. Also tell them if you smoke, drink alcohol, or use illegal drugs. Some items may interact with your medicine. What should I watch for while using this medicine? This drug may make you feel generally unwell. This is not uncommon, as chemotherapy can affect healthy cells as well as cancer cells. Report any side effects. Continue your course of treatment even though you feel ill unless your doctor tells you to stop. There is a maximum amount of this  medicine you should receive throughout your life. The amount depends on the medical condition being treated and your overall health. Your doctor will watch how much of this medicine you receive in your lifetime. Tell your doctor if you have taken this  medicine before. You may need blood work done while you are taking this medicine. Your urine may turn red for a few days after your dose. This is not blood. If your urine is dark or brown, call your doctor. In some cases, you may be given additional medicines to help with side effects. Follow all directions for their use. Call your doctor or health care professional for advice if you get a fever, chills or sore throat, or other symptoms of a cold or flu. Do not treat yourself. This drug decreases your body's ability to fight infections. Try to avoid being around people who are sick. This medicine may increase your risk to bruise or bleed. Call your doctor or health care professional if you notice any unusual bleeding. Talk to your doctor about your risk of cancer. You may be more at risk for certain types of cancers if you take this medicine. Do not become pregnant while taking this medicine or for 6 months after stopping it. Women should inform their doctor if they wish to become pregnant or think they might be pregnant. Men should not father a child while taking this medicine and for 6 months after stopping it. There is a potential for serious side effects to an unborn child. Talk to your health care professional or pharmacist for more information. Do not breast-feed an infant while taking this medicine. This medicine has caused ovarian failure in some women and reduced sperm counts in some men This medicine may interfere with the ability to have a child. Talk with your doctor or health care professional if you are concerned about your fertility. This medicine may cause a decrease in Co-Enzyme Q-10. You should make sure that you get enough Co-Enzyme Q-10 while you are taking this medicine. Discuss the foods you eat and the vitamins you take with your health care professional. What side effects may I notice from receiving this medicine? Side effects that you should report to your doctor or health care  professional as soon as possible:  allergic reactions like skin rash, itching or hives, swelling of the face, lips, or tongue  breathing problems  chest pain  fast or irregular heartbeat  low blood counts - this medicine may decrease the number of white blood cells, red blood cells and platelets. You may be at increased risk for infections and bleeding.  pain, redness, or irritation at site where injected  signs of infection - fever or chills, cough, sore throat, pain or difficulty passing urine  signs of decreased platelets or bleeding - bruising, pinpoint red spots on the skin, black, tarry stools, blood in the urine  swelling of the ankles, feet, hands  tiredness  weakness Side effects that usually do not require medical attention (report to your doctor or health care professional if they continue or are bothersome):  diarrhea  hair loss  mouth sores  nail discoloration or damage  nausea  red colored urine  vomiting This list may not describe all possible side effects. Call your doctor for medical advice about side effects. You may report side effects to FDA at 1-800-FDA-1088. Where should I keep my medicine? This drug is given in a hospital or clinic and will not be stored at home. NOTE: This  sheet is a summary. It may not cover all possible information. If you have questions about this medicine, talk to your doctor, pharmacist, or health care provider.  2020 Elsevier/Gold Standard (2016-09-09 11:01:26)  Cyclophosphamide injection What is this medicine? CYCLOPHOSPHAMIDE (sye kloe FOSS fa mide) is a chemotherapy drug. It slows the growth of cancer cells. This medicine is used to treat many types of cancer like lymphoma, myeloma, leukemia, breast cancer, and ovarian cancer, to name a few. This medicine may be used for other purposes; ask your health care provider or pharmacist if you have questions. COMMON BRAND NAME(S): Cytoxan, Neosar What should I tell my health  care provider before I take this medicine? They need to know if you have any of these conditions:  blood disorders  history of other chemotherapy  infection  kidney disease  liver disease  recent or ongoing radiation therapy  tumors in the bone marrow  an unusual or allergic reaction to cyclophosphamide, other chemotherapy, other medicines, foods, dyes, or preservatives  pregnant or trying to get pregnant  breast-feeding How should I use this medicine? This drug is usually given as an injection into a vein or muscle or by infusion into a vein. It is administered in a hospital or clinic by a specially trained health care professional. Talk to your pediatrician regarding the use of this medicine in children. Special care may be needed. Overdosage: If you think you have taken too much of this medicine contact a poison control center or emergency room at once. NOTE: This medicine is only for you. Do not share this medicine with others. What if I miss a dose? It is important not to miss your dose. Call your doctor or health care professional if you are unable to keep an appointment. What may interact with this medicine? This medicine may interact with the following medications:  amiodarone  amphotericin B  azathioprine  certain antiviral medicines for HIV or AIDS such as protease inhibitors (e.g., indinavir, ritonavir) and zidovudine  certain blood pressure medications such as benazepril, captopril, enalapril, fosinopril, lisinopril, moexipril, monopril, perindopril, quinapril, ramipril, trandolapril  certain cancer medications such as anthracyclines (e.g., daunorubicin, doxorubicin), busulfan, cytarabine, paclitaxel, pentostatin, tamoxifen, trastuzumab  certain diuretics such as chlorothiazide, chlorthalidone, hydrochlorothiazide, indapamide, metolazone  certain medicines that treat or prevent blood clots like warfarin  certain muscle relaxants such as  succinylcholine  cyclosporine  etanercept  indomethacin  medicines to increase blood counts like filgrastim, pegfilgrastim, sargramostim  medicines used as general anesthesia  metronidazole  natalizumab This list may not describe all possible interactions. Give your health care provider a list of all the medicines, herbs, non-prescription drugs, or dietary supplements you use. Also tell them if you smoke, drink alcohol, or use illegal drugs. Some items may interact with your medicine. What should I watch for while using this medicine? Visit your doctor for checks on your progress. This drug may make you feel generally unwell. This is not uncommon, as chemotherapy can affect healthy cells as well as cancer cells. Report any side effects. Continue your course of treatment even though you feel ill unless your doctor tells you to stop. Drink water or other fluids as directed. Urinate often, even at night. In some cases, you may be given additional medicines to help with side effects. Follow all directions for their use. Call your doctor or health care professional for advice if you get a fever, chills or sore throat, or other symptoms of a cold or flu. Do not treat yourself.  This drug decreases your body's ability to fight infections. Try to avoid being around people who are sick. This medicine may increase your risk to bruise or bleed. Call your doctor or health care professional if you notice any unusual bleeding. Be careful brushing and flossing your teeth or using a toothpick because you may get an infection or bleed more easily. If you have any dental work done, tell your dentist you are receiving this medicine. You may get drowsy or dizzy. Do not drive, use machinery, or do anything that needs mental alertness until you know how this medicine affects you. Do not become pregnant while taking this medicine or for 1 year after stopping it. Women should inform their doctor if they wish to  become pregnant or think they might be pregnant. Men should not father a child while taking this medicine and for 4 months after stopping it. There is a potential for serious side effects to an unborn child. Talk to your health care professional or pharmacist for more information. Do not breast-feed an infant while taking this medicine. This medicine may interfere with the ability to have a child. This medicine has caused ovarian failure in some women. This medicine has caused reduced sperm counts in some men. You should talk with your doctor or health care professional if you are concerned about your fertility. If you are going to have surgery, tell your doctor or health care professional that you have taken this medicine. What side effects may I notice from receiving this medicine? Side effects that you should report to your doctor or health care professional as soon as possible:  allergic reactions like skin rash, itching or hives, swelling of the face, lips, or tongue  low blood counts - this medicine may decrease the number of white blood cells, red blood cells and platelets. You may be at increased risk for infections and bleeding.  signs of infection - fever or chills, cough, sore throat, pain or difficulty passing urine  signs of decreased platelets or bleeding - bruising, pinpoint red spots on the skin, black, tarry stools, blood in the urine  signs of decreased red blood cells - unusually weak or tired, fainting spells, lightheadedness  breathing problems  dark urine  dizziness  palpitations  swelling of the ankles, feet, hands  trouble passing urine or change in the amount of urine  weight gain  yellowing of the eyes or skin Side effects that usually do not require medical attention (report to your doctor or health care professional if they continue or are bothersome):  changes in nail or skin color  hair loss  missed menstrual periods  mouth sores  nausea,  vomiting This list may not describe all possible side effects. Call your doctor for medical advice about side effects. You may report side effects to FDA at 1-800-FDA-1088. Where should I keep my medicine? This drug is given in a hospital or clinic and will not be stored at home. NOTE: This sheet is a summary. It may not cover all possible information. If you have questions about this medicine, talk to your doctor, pharmacist, or health care provider.  2020 Elsevier/Gold Standard (2011-12-11 16:22:58)

## 2018-12-23 NOTE — Assessment & Plan Note (Signed)
12/07/2018:Patient palpated a lump in the left breast. Mammogram showed a 1.4cm mass at the 5 o'clock position in the left breast, a left axillary lymph node measuring 2.8cm, and 2 smaller axillary lymph nodes with cortical thickening. 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 Neoadjuvant chemotherapy with Adriamycin and Cytoxan dose dense 4 followed by Taxol weekly 12 with carboplatin every 3 weeks 2. Followed by breast conserving surgery with targeted axillary dissection 3. Followed by adjuvant radiation therapy -------------------------------------------------------------------------------------------------------------------------------------------------- Current treatment: Cycle 1 day 1 dose dense Adriamycin and Cytoxan Echocardiogram: 12/21/2018: EF 60 to 65% Labs reviewed Chemo consent obtained chemo education completed Antiemetics were reviewed Return to clinic in 1 week for toxicity check

## 2018-12-26 ENCOUNTER — Other Ambulatory Visit: Payer: Self-pay

## 2018-12-26 ENCOUNTER — Telehealth: Payer: Self-pay | Admitting: *Deleted

## 2018-12-26 ENCOUNTER — Encounter: Payer: Self-pay | Admitting: Hematology and Oncology

## 2018-12-26 ENCOUNTER — Inpatient Hospital Stay: Payer: Commercial Managed Care - PPO

## 2018-12-26 VITALS — BP 116/77 | HR 60 | Temp 98.3°F

## 2018-12-26 DIAGNOSIS — C50512 Malignant neoplasm of lower-outer quadrant of left female breast: Secondary | ICD-10-CM

## 2018-12-26 MED ORDER — PEGFILGRASTIM-JMDB 6 MG/0.6ML ~~LOC~~ SOSY
PREFILLED_SYRINGE | SUBCUTANEOUS | Status: AC
  Filled 2018-12-26: qty 0.6

## 2018-12-26 MED ORDER — PEGFILGRASTIM-JMDB 6 MG/0.6ML ~~LOC~~ SOSY
6.0000 mg | PREFILLED_SYRINGE | Freq: Once | SUBCUTANEOUS | Status: AC
  Administered 2018-12-26: 11:00:00 6 mg via SUBCUTANEOUS

## 2018-12-26 NOTE — Telephone Encounter (Signed)
Spoke to pt regarding 1st AC on 11/13. Relate doing well, denies n/v. Able to eat and drink. Denies questions or concerns at this time. Encourage pt to call with needs.

## 2018-12-26 NOTE — Patient Instructions (Signed)

## 2018-12-26 NOTE — Progress Notes (Signed)
Met with patient at registration to introduce myself as Arboriculturist and to offer available resources.  Discussed one-time $1000 Radio broadcast assistant to assist with personal expenses while going through treatment.  Also, discussed copay assistance if have not met ded/OOP for the year for treatment drugs.  Gave her my card if interested in applying and for any additional financial questions or concerns.

## 2018-12-27 ENCOUNTER — Telehealth: Payer: Self-pay | Admitting: Hematology and Oncology

## 2018-12-27 NOTE — Telephone Encounter (Signed)
Scheduled appt per 11/16 sch message - pt aware of appt date and time for 11/20

## 2018-12-29 NOTE — Progress Notes (Signed)
Patient Care Team: [name], FNP as PCP - General (Family Medicine) Mauro Kaufmann, RN as Oncology Nurse Navigator Rockwell Germany, RN as Oncology Nurse Navigator  DIAGNOSIS:    ICD-10-CM   1. Malignant neoplasm of lower-outer quadrant of left breast of female, estrogen receptor negative ([place])  C50.512    Z17.1     SUMMARY OF ONCOLOGIC HISTORY: Oncology History  Malignant neoplasm of lower-outer quadrant of left breast of female, estrogen receptor negative ([place])  12/07/2018 Initial Diagnosis   Patient palpated a lump in the left breast. Mammogram showed a 1.4cm mass at the 5 o'clock position in the left breast, a left axillary lymph node measuring 2.8cm, and 2 smaller axillary lymph nodes with cortical thickening. 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   12/14/2018 Cancer Staging   Staging form: Breast, AJCC 8th Edition - Clinical: Stage IIB (cT1c, cN1, cM0, G3, ER-, PR-, HER2-) - Signed by Nicholas Lose, MD on 12/14/2018   12/23/2018 -  Chemotherapy   The patient had DOXOrubicin (ADRIAMYCIN) chemo injection 98 mg, 60 mg/m2 = 98 mg, Intravenous,  Once, 1 of 4 cycles Administration: 98 mg (12/23/2018) palonosetron (ALOXI) injection 0.25 mg, 0.25 mg, Intravenous,  Once, 1 of 8 cycles Administration: 0.25 mg (12/23/2018) pegfilgrastim-jmdb (FULPHILA) injection 6 mg, 6 mg, Subcutaneous,  Once, 1 of 4 cycles Administration: 6 mg (12/26/2018) CARBOplatin (PARAPLATIN) 540 mg in sodium chloride 0.9 % 250 mL chemo infusion, 540 mg (100 % of original dose 544.2 mg), Intravenous,  Once, 0 of 4 cycles Dose modification:   (original dose 544.2 mg, Cycle 5) cyclophosphamide (CYTOXAN) 980 mg in sodium chloride 0.9 % 250 mL chemo infusion, 600 mg/m2 = 980 mg, Intravenous,  Once, 1 of 4 cycles Administration: 980 mg (12/23/2018) PACLitaxel (TAXOL) 132 mg in sodium chloride 0.9 % 250 mL chemo infusion (</= 75m/m2), 80 mg/m2 = 132 mg,  Intravenous,  Once, 0 of 4 cycles fosaprepitant (EMEND) 150 mg, dexamethasone (DECADRON) 12 mg in sodium chloride 0.9 % 145 mL IVPB, , Intravenous,  Once, 1 of 8 cycles Administration:  (12/23/2018)  for chemotherapy treatment.      CHIEF COMPLIANT: Cycle 1 Day 8 Adriamycin and Cytoxan   INTERVAL HISTORY: Heather Hebert a 58y.o. 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 She is currently receiving neoadjuvant chemotherapy with dose dense Adriamycin and Cytoxan. She presents to the clinic today for a toxicity check following cycle 1.   She tolerated chemotherapy extremely well without any problems or concerns but did not have any nausea or vomiting.  She took Ativan for sleep which helped her tremendously.  REVIEW OF SYSTEMS:   Constitutional: Denies fevers, chills or abnormal weight loss Eyes: Denies blurriness of vision Ears, nose, mouth, throat, and face: Denies mucositis or sore throat Respiratory: Denies cough, dyspnea or wheezes Cardiovascular: Denies palpitation, chest discomfort Gastrointestinal: Denies nausea, heartburn or change in bowel habits Skin: Denies abnormal skin rashes Lymphatics: Denies new lymphadenopathy or easy bruising Neurological: Denies numbness, tingling or new weaknesses Behavioral/Psych: Mood is stable, no new changes  Extremities: No lower extremity edema Breast: denies any pain or lumps or nodules in either breasts All other systems were reviewed with the patient and are negative.  I have reviewed the past medical history, past surgical history, social history and family history with the patient and they are unchanged from previous note.  ALLERGIES:  has No Known Allergies.  MEDICATIONS:  Current Outpatient Medications  Medication Sig Dispense Refill  . Calcium Carb-Cholecalciferol (CALCIUM 600 + D PO) Take 1 tablet by mouth 2 (two) times daily.    . cetirizine (ZYRTEC) 10 MG tablet Take 10 mg by mouth daily.    Marland Kitchen  dexamethasone (DECADRON) 4 MG tablet Take 1 tablet day after chemo and 1 tablet 2 days after chemo with food 8 tablet 0  . Flax OIL Take by mouth. One cap per day    . lidocaine-prilocaine (EMLA) cream Apply to affected area once 30 g 3  . LORazepam (ATIVAN) 0.5 MG tablet Take 1 tablet (0.5 mg total) by mouth at bedtime as needed for sleep. 30 tablet 0  . Multiple Vitamin (MULTIVITAMIN WITH MINERALS) TABS tablet Take 1 tablet by mouth daily.    . Olopatadine HCl (PATADAY OP) Place 1 drop into both eyes daily as needed. For dry eyes    . ondansetron (ZOFRAN) 8 MG tablet Take 1 tablet (8 mg total) by mouth 2 (two) times daily as needed. Start on the third day after chemotherapy. 30 tablet 1  . oxyCODONE (OXY IR/ROXICODONE) 5 MG immediate release tablet Take 1 tablet (5 mg total) by mouth every 6 (six) hours as needed for severe pain. 5 tablet 0  . Probiotic Product (PROBIOTIC PO) Take 1 tablet by mouth every other day.     . prochlorperazine (COMPAZINE) 10 MG tablet Take 1 tablet (10 mg total) by mouth every 6 (six) hours as needed (Nausea or vomiting). 30 tablet 1   No current facility-administered medications for this visit.     PHYSICAL EXAMINATION: ECOG PERFORMANCE STATUS: 1 - Symptomatic but completely ambulatory  Vitals:   12/30/18 1234  BP: 118/67  Pulse: 87  Resp: 20  Temp: 97.9 F (36.6 C)  SpO2: 100%   Filed Weights   12/30/18 1234  Weight: 131 lb 3.2 oz (59.5 kg)    GENERAL: alert, no distress and comfortable SKIN: skin color, texture, turgor are normal, no rashes or significant lesions EYES: normal, Conjunctiva are pink and non-injected, sclera clear OROPHARYNX: no exudate, no erythema and lips, buccal mucosa, and tongue normal  NECK: supple, thyroid normal size, non-tender, without nodularity LYMPH: no palpable lymphadenopathy in the cervical, axillary or inguinal LUNGS: clear to auscultation and percussion with normal breathing effort HEART: regular rate & rhythm and  no murmurs and no lower extremity edema ABDOMEN: abdomen soft, non-tender and normal bowel sounds MUSCULOSKELETAL: no cyanosis of digits and no clubbing  NEURO: alert & oriented x 3 with fluent speech, no focal motor/sensory deficits EXTREMITIES: No lower extremity edema  LABORATORY DATA:  I have reviewed the data as listed CMP Latest Ref Rng & Units 12/23/2018 12/14/2018 08/05/2015  Glucose 70 - 99 mg/dL 210(H) 97 98  BUN 6 - 20 mg/dL '11 11 10  ' Creatinine 0.44 - 1.00 mg/dL 0.85 0.86 0.90  Sodium 135 - 145 mmol/L 141 140 134(L)  Potassium 3.5 - 5.1 mmol/L 4.0 3.8 4.0  Chloride 98 - 111 mmol/L 105 102 98(L)  CO2 22 - 32 mmol/L '24 28 26  ' Calcium 8.9 - 10.3 mg/dL 9.2 9.9 9.6  Total Protein 6.5 - 8.1 g/dL 7.5 8.2(H) 8.3(H)  Total Bilirubin 0.3 - 1.2 mg/dL 0.4 0.8 1.1  Alkaline Phos 38 - 126 U/L 76 90 108  AST 15 - 41 U/L '20 18 30  ' ALT 0 - 44 U/L '11 10 23    ' Lab Results  Component Value Date   WBC 0.8 (  LL) 12/30/2018   HGB 11.6 (L) 12/30/2018   HCT 34.4 (L) 12/30/2018   MCV 92.5 12/30/2018   PLT 48 (L) 12/30/2018   NEUTROABS 0.1 (LL) 12/30/2018    ASSESSMENT & PLAN:  Malignant neoplasm of lower-outer quadrant of left breast of female, estrogen receptor negative ([place]) 12/07/2018:Patient palpated a lump in the left breast. Mammogram showed a 1.4cm mass at the 5 o'clock position in the left breast, a left axillary lymph node measuring 2.8cm, and 2 smaller axillary lymph nodes with cortical thickening. 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 Neoadjuvant chemotherapy with Adriamycin and Cytoxan dose dense 4 followed byTaxolweekly 12with carboplatin every 3 weeks 2. Followed by breast conserving surgery with axillary lymph node dissection (6 lymph nodes were found on MRI) 3. Followed by adjuvant radiation therapy  Breast MRI on 12/21/18 showed the known 1.5cm mass in the left breast, at least 6  abnormal left axillary lymph nodes, and no right breast malignancy -------------------------------------------------------------------------------------------------------------------------------------------------- Current treatment: Cycle 1 day 8 dose dense Adriamycin and Cytoxan Echocardiogram: 12/21/2018: EF 60 to 65% Labs reviewed Chemo toxicities: Denies any nausea or vomiting Severe cytopenias: ANC 0.1: We will decrease the dosage of cycle 2.  Severe vaginal dryness: Patient is seeing a specialist at [facility] Return to clinic in 1 week for cycle 2    No orders of the defined types were placed in this encounter.  The patient has a good understanding of the overall plan. she agrees with it. she will call with any problems that may develop before the next visit here.  Nicholas Lose, MD 12/30/2018  Julious Oka Dorshimer, am acting as scribe for Dr. Nicholas Lose.  I have reviewed the above documentation for accuracy and completeness, and I agree with the above.

## 2018-12-30 ENCOUNTER — Encounter: Payer: Self-pay | Admitting: *Deleted

## 2018-12-30 ENCOUNTER — Inpatient Hospital Stay (HOSPITAL_BASED_OUTPATIENT_CLINIC_OR_DEPARTMENT_OTHER): Payer: Commercial Managed Care - PPO | Admitting: Hematology and Oncology

## 2018-12-30 ENCOUNTER — Other Ambulatory Visit: Payer: Self-pay

## 2018-12-30 ENCOUNTER — Inpatient Hospital Stay: Payer: Commercial Managed Care - PPO

## 2018-12-30 DIAGNOSIS — C50512 Malignant neoplasm of lower-outer quadrant of left female breast: Secondary | ICD-10-CM

## 2018-12-30 DIAGNOSIS — Z171 Estrogen receptor negative status [ER-]: Secondary | ICD-10-CM | POA: Diagnosis not present

## 2018-12-30 DIAGNOSIS — Z95828 Presence of other vascular implants and grafts: Secondary | ICD-10-CM

## 2018-12-30 LAB — CMP (CANCER CENTER ONLY)
ALT: 13 U/L (ref 0–44)
AST: 13 U/L — ABNORMAL LOW (ref 15–41)
Albumin: 3.8 g/dL (ref 3.5–5.0)
Alkaline Phosphatase: 82 U/L (ref 38–126)
Anion gap: 8 (ref 5–15)
BUN: 14 mg/dL (ref 6–20)
CO2: 26 mmol/L (ref 22–32)
Calcium: 9.3 mg/dL (ref 8.9–10.3)
Chloride: 102 mmol/L (ref 98–111)
Creatinine: 0.71 mg/dL (ref 0.44–1.00)
GFR, Est AFR Am: >60 mL/min (ref >60)
GFR, Estimated: >60 mL/min (ref >60)
Glucose, Bld: 101 mg/dL — ABNORMAL HIGH (ref 70–99)
Potassium: 4.1 mmol/L (ref 3.5–5.1)
Sodium: 136 mmol/L (ref 135–145)
Total Bilirubin: 0.5 mg/dL (ref 0.3–1.2)
Total Protein: 6.9 g/dL (ref 6.5–8.1)

## 2018-12-30 LAB — CBC WITH DIFFERENTIAL (CANCER CENTER ONLY)
Abs Immature Granulocytes: 0.02 10*3/uL (ref 0.00–0.07)
Basophils Absolute: 0 10*3/uL (ref 0.0–0.1)
Basophils Relative: 2 %
Eosinophils Absolute: 0.1 10*3/uL (ref 0.0–0.5)
Eosinophils Relative: 12 %
HCT: 34.4 % — ABNORMAL LOW (ref 36.0–46.0)
Hemoglobin: 11.6 g/dL — ABNORMAL LOW (ref 12.0–15.0)
Immature Granulocytes: 2 %
Lymphocytes Relative: 74 %
Lymphs Abs: 0.6 10*3/uL — ABNORMAL LOW (ref 0.7–4.0)
MCH: 31.2 pg (ref 26.0–34.0)
MCHC: 33.7 g/dL (ref 30.0–36.0)
MCV: 92.5 fL (ref 80.0–100.0)
Monocytes Absolute: 0 10*3/uL — ABNORMAL LOW (ref 0.1–1.0)
Monocytes Relative: 4 %
Neutro Abs: 0.1 10*3/uL — CL (ref 1.7–7.7)
Neutrophils Relative %: 6 %
Platelet Count: 48 10*3/uL — ABNORMAL LOW (ref 150–400)
RBC: 3.72 MIL/uL — ABNORMAL LOW (ref 3.87–5.11)
RDW: 11.9 % (ref 11.5–15.5)
WBC Count: 0.8 10*3/uL — CL (ref 4.0–10.5)
nRBC: 0 % (ref 0.0–0.2)

## 2018-12-30 MED ORDER — SODIUM CHLORIDE 0.9% FLUSH
10.0000 mL | INTRAVENOUS | Status: DC | PRN
  Administered 2018-12-30: 10 mL via INTRAVENOUS
  Filled 2018-12-30: qty 10

## 2018-12-30 MED ORDER — HEPARIN SOD (PORK) LOCK FLUSH 100 UNIT/ML IV SOLN
500.0000 [IU] | Freq: Once | INTRAVENOUS | Status: AC
  Administered 2018-12-30: 12:00:00 500 [IU] via INTRAVENOUS
  Filled 2018-12-30: qty 5

## 2018-12-30 NOTE — Assessment & Plan Note (Signed)
12/07/2018:Patient palpated a lump in the left breast. Mammogram showed a 1.4cm mass at the 5 o'clock position in the left breast, a left axillary lymph node measuring 2.8cm, and 2 smaller axillary lymph nodes with cortical thickening. 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 Neoadjuvant chemotherapy with Adriamycin and Cytoxan dose dense 4 followed byTaxolweekly 12with carboplatin every 3 weeks 2. Followed by breast conserving surgery with axillary lymph node dissection (6 lymph nodes were found on MRI) 3. Followed by adjuvant radiation therapy  Breast MRI on 12/21/18 showed the known 1.5cm mass in the left breast, at least 6 abnormal left axillary lymph nodes, and no right breast malignancy -------------------------------------------------------------------------------------------------------------------------------------------------- Current treatment: Cycle 1 day 8 dose dense Adriamycin and Cytoxan Echocardiogram: 12/21/2018: EF 60 to 65% Labs reviewed Chemo toxicities:  Return to clinic in 1 week for cycle 2

## 2018-12-30 NOTE — Progress Notes (Signed)
CRITICAL VALUE ALERT  Critical Value:  WBC 0.8 and ANC 0.1  Date & Time Notied:  12/30/2018  Provider Notified: Margarite Gouge MD  Orders Received/Actions taken: MD aware

## 2019-01-02 ENCOUNTER — Telehealth: Payer: Self-pay | Admitting: Hematology and Oncology

## 2019-01-02 NOTE — Telephone Encounter (Signed)
I talk with patient regarding schedule  

## 2019-01-05 NOTE — Progress Notes (Signed)
Patient Care Team: [name], FNP as PCP - General (Family Medicine) Mauro Kaufmann, RN as Oncology Nurse Navigator Rockwell Germany, RN as Oncology Nurse Navigator  DIAGNOSIS:    ICD-10-CM   1. Malignant neoplasm of lower-outer quadrant of left breast of female, estrogen receptor negative ([place])  C50.512    Z17.1     SUMMARY OF ONCOLOGIC HISTORY: Oncology History  Malignant neoplasm of lower-outer quadrant of left breast of female, estrogen receptor negative ([place])  12/07/2018 Initial Diagnosis   Patient palpated a lump in the left breast. Mammogram showed a 1.4cm mass at the 5 o'clock position in the left breast, a left axillary lymph node measuring 2.8cm, and 2 smaller axillary lymph nodes with cortical thickening. 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   12/14/2018 Cancer Staging   Staging form: Breast, AJCC 8th Edition - Clinical: Stage IIB (cT1c, cN1, cM0, G3, ER-, PR-, HER2-) - Signed by Nicholas Lose, MD on 12/14/2018   12/23/2018 -  Chemotherapy   The patient had DOXOrubicin (ADRIAMYCIN) chemo injection 98 mg, 60 mg/m2 = 98 mg, Intravenous,  Once, 1 of 4 cycles Dose modification: 50 mg/m2 (original dose 60 mg/m2, Cycle 2, Reason: Dose not tolerated) Administration: 98 mg (12/23/2018) palonosetron (ALOXI) injection 0.25 mg, 0.25 mg, Intravenous,  Once, 1 of 8 cycles Administration: 0.25 mg (12/23/2018) pegfilgrastim-jmdb (FULPHILA) injection 6 mg, 6 mg, Subcutaneous,  Once, 1 of 4 cycles Administration: 6 mg (12/26/2018) CARBOplatin (PARAPLATIN) 540 mg in sodium chloride 0.9 % 250 mL chemo infusion, 540 mg (100 % of original dose 544.2 mg), Intravenous,  Once, 0 of 4 cycles Dose modification:   (original dose 544.2 mg, Cycle 5) cyclophosphamide (CYTOXAN) 980 mg in sodium chloride 0.9 % 250 mL chemo infusion, 600 mg/m2 = 980 mg, Intravenous,  Once, 1 of 4 cycles Dose modification: 500 mg/m2 (original dose 600 mg/m2,  Cycle 2, Reason: Dose not tolerated) Administration: 980 mg (12/23/2018) PACLitaxel (TAXOL) 132 mg in sodium chloride 0.9 % 250 mL chemo infusion (</= 31m/m2), 80 mg/m2 = 132 mg, Intravenous,  Once, 0 of 4 cycles fosaprepitant (EMEND) 150 mg, dexamethasone (DECADRON) 12 mg in sodium chloride 0.9 % 145 mL IVPB, , Intravenous,  Once, 1 of 8 cycles Administration:  (12/23/2018)  for chemotherapy treatment.      CHIEF COMPLIANT: Cycle 2 Adriamycin and Cytoxan  INTERVAL HISTORY: Heather Hebert a 58y.o. 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 She is currently receiving neoadjuvant chemotherapy with dose dense Adriamycin and Cytoxan. She presents to the clinic todayfor cycle 2.   REVIEW OF SYSTEMS:   Constitutional: Denies fevers, chills or abnormal weight loss Eyes: Denies blurriness of vision Ears, nose, mouth, throat, and face: Denies mucositis or sore throat Respiratory: Denies cough, dyspnea or wheezes Cardiovascular: Denies palpitation, chest discomfort Gastrointestinal: Denies nausea, heartburn or change in bowel habits Skin: Denies abnormal skin rashes Lymphatics: Denies new lymphadenopathy or easy bruising Neurological: Denies numbness, tingling or new weaknesses Behavioral/Psych: Mood is stable, no new changes  Extremities: No lower extremity edema Breast: denies any pain or lumps or nodules in either breasts All other systems were reviewed with the patient and are negative.  I have reviewed the past medical history, past surgical history, social history and family history with the patient and they are unchanged from previous note.  ALLERGIES:  has No Known Allergies.  MEDICATIONS:  Current Outpatient Medications  Medication Sig Dispense  Refill  . Calcium Carb-Cholecalciferol (CALCIUM 600 + D PO) Take 1 tablet by mouth 2 (two) times daily.    . cetirizine (ZYRTEC) 10 MG tablet Take 10 mg by mouth daily.    Marland Kitchen dexamethasone (DECADRON) 4 MG  tablet Take 1 tablet day after chemo and 1 tablet 2 days after chemo with food 8 tablet 0  . Flax OIL Take by mouth. One cap per day    . lidocaine-prilocaine (EMLA) cream Apply to affected area once 30 g 3  . LORazepam (ATIVAN) 0.5 MG tablet Take 1 tablet (0.5 mg total) by mouth at bedtime as needed for sleep. 30 tablet 0  . Multiple Vitamin (MULTIVITAMIN WITH MINERALS) TABS tablet Take 1 tablet by mouth daily.    . Olopatadine HCl (PATADAY OP) Place 1 drop into both eyes daily as needed. For dry eyes    . ondansetron (ZOFRAN) 8 MG tablet Take 1 tablet (8 mg total) by mouth 2 (two) times daily as needed. Start on the third day after chemotherapy. 30 tablet 1  . oxyCODONE (OXY IR/ROXICODONE) 5 MG immediate release tablet Take 1 tablet (5 mg total) by mouth every 6 (six) hours as needed for severe pain. 5 tablet 0  . Probiotic Product (PROBIOTIC PO) Take 1 tablet by mouth every other day.     . prochlorperazine (COMPAZINE) 10 MG tablet Take 1 tablet (10 mg total) by mouth every 6 (six) hours as needed (Nausea or vomiting). 30 tablet 1   No current facility-administered medications for this visit.    Facility-Administered Medications Ordered in Other Visits  Medication Dose Route Frequency Provider Last Rate Last Dose  . sodium chloride flush (NS) 0.9 % injection 10 mL  10 mL Intravenous PRN Nicholas Lose, MD   10 mL at 01/06/19 0807    PHYSICAL EXAMINATION: ECOG PERFORMANCE STATUS: 1 - Symptomatic but completely ambulatory  There were no vitals filed for this visit. There were no vitals filed for this visit.  GENERAL: alert, no distress and comfortable SKIN: skin color, texture, turgor are normal, no rashes or significant lesions EYES: normal, Conjunctiva are pink and non-injected, sclera clear OROPHARYNX: no exudate, no erythema and lips, buccal mucosa, and tongue normal  NECK: supple, thyroid normal size, non-tender, without nodularity LYMPH: no palpable lymphadenopathy in the cervical,  axillary or inguinal LUNGS: clear to auscultation and percussion with normal breathing effort HEART: regular rate & rhythm and no murmurs and no lower extremity edema ABDOMEN: abdomen soft, non-tender and normal bowel sounds MUSCULOSKELETAL: no cyanosis of digits and no clubbing  NEURO: alert & oriented x 3 with fluent speech, no focal motor/sensory deficits EXTREMITIES: No lower extremity edema  LABORATORY DATA:  I have reviewed the data as listed CMP Latest Ref Rng & Units 12/30/2018 12/23/2018 12/14/2018  Glucose 70 - 99 mg/dL 101(H) 210(H) 97  BUN 6 - 20 mg/dL _0 Creatinine 0.44 - 1.00 mg/dL 0.71 0.85 0.86  Sodium 135 - 145 mmol/L 136 141 140  Potassium 3.5 - 5.1 mmol/L 4.1 4.0 3.8  Chloride 98 - 111 mmol/L 102 105 102  CO2 22 - 32 mmol/L _1 Calcium 8.9 - 10.3 mg/dL 9.3 9.2 9.9  Total Protein 6.5 - 8.1 g/dL 6.9 7.5 8.2(H)  Total Bilirubin 0.3 - 1.2 mg/dL 0.5 0.4 0.8  Alkaline Phos 38 - 126 U/L 82 76 90  AST 15 - 41 U/L 13(L) 20 18  ALT 0 - 44 U/L 13 11 10  Lab Results  Component Value Date   WBC 0.8 (LL) 12/30/2018   HGB 11.6 (L) 12/30/2018   HCT 34.4 (L) 12/30/2018   MCV 92.5 12/30/2018   PLT 48 (L) 12/30/2018   NEUTROABS 0.1 (LL) 12/30/2018    ASSESSMENT & PLAN:  Malignant neoplasm of lower-outer quadrant of left breast of female, estrogen receptor negative ([place]) 12/07/2018:Patient palpated a lump in the left breast. Mammogram showed a 1.4cm mass at the 5 o'clock position in the left breast, a left axillary lymph node measuring 2.8cm, and 2 smaller axillary lymph nodes with cortical thickening. 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 Neoadjuvant chemotherapy with Adriamycin and Cytoxan dose dense 4 followed byTaxolweekly 12with carboplatin every 3 weeks 2. Followed by breast conserving surgery withaxillary lymph nodedissection(6 lymph nodes were found on MRI) 3.  Followed by adjuvant radiation therapy  Breast MRI on 12/21/18 showed the known 1.5cm mass in the left breast, at least 6 abnormal left axillary lymph nodes, and no right breast malignancy -------------------------------------------------------------------------------------------------------------------------------------------------- Current treatment: Cycle 2 dose dense Adriamycin and Cytoxan Echocardiogram: 12/21/2018: EF 60 to 65% Labs reviewed Chemo toxicities: Denies any nausea or vomiting Severe cytopenias: ANC 0.1: We will decrease the dosage of cycle 2.  Severe vaginal dryness: Patient is seeing a specialist at [facility] Return to clinic in 2 weeks for cycle 3  No orders of the defined types were placed in this encounter.  The patient has a good understanding of the overall plan. she agrees with it. she will call with any problems that may develop before the next visit here.  Nicholas Lose, MD 01/06/2019  Heather Hebert, am acting as scribe for Dr. Nicholas Lose.  I have reviewed the above documentation for accuracy and completeness, and I agree with the above.

## 2019-01-06 ENCOUNTER — Inpatient Hospital Stay (HOSPITAL_BASED_OUTPATIENT_CLINIC_OR_DEPARTMENT_OTHER): Payer: Commercial Managed Care - PPO | Admitting: Hematology and Oncology

## 2019-01-06 ENCOUNTER — Other Ambulatory Visit: Payer: Self-pay

## 2019-01-06 ENCOUNTER — Inpatient Hospital Stay: Payer: Commercial Managed Care - PPO

## 2019-01-06 ENCOUNTER — Encounter: Payer: Self-pay | Admitting: Hematology and Oncology

## 2019-01-06 DIAGNOSIS — C50512 Malignant neoplasm of lower-outer quadrant of left female breast: Secondary | ICD-10-CM

## 2019-01-06 DIAGNOSIS — Z95828 Presence of other vascular implants and grafts: Secondary | ICD-10-CM

## 2019-01-06 DIAGNOSIS — Z171 Estrogen receptor negative status [ER-]: Secondary | ICD-10-CM | POA: Diagnosis not present

## 2019-01-06 LAB — CBC WITH DIFFERENTIAL (CANCER CENTER ONLY)
Abs Immature Granulocytes: 1.55 10*3/uL — ABNORMAL HIGH (ref 0.00–0.07)
Basophils Absolute: 0 10*3/uL (ref 0.0–0.1)
Basophils Relative: 0 %
Eosinophils Absolute: 0.1 10*3/uL (ref 0.0–0.5)
Eosinophils Relative: 1 %
HCT: 32.8 % — ABNORMAL LOW (ref 36.0–46.0)
Hemoglobin: 10.8 g/dL — ABNORMAL LOW (ref 12.0–15.0)
Immature Granulocytes: 10 %
Lymphocytes Relative: 9 %
Lymphs Abs: 1.4 10*3/uL (ref 0.7–4.0)
MCH: 30.9 pg (ref 26.0–34.0)
MCHC: 32.9 g/dL (ref 30.0–36.0)
MCV: 94 fL (ref 80.0–100.0)
Monocytes Absolute: 1.9 10*3/uL — ABNORMAL HIGH (ref 0.1–1.0)
Monocytes Relative: 12 %
Neutro Abs: 10.5 10*3/uL — ABNORMAL HIGH (ref 1.7–7.7)
Neutrophils Relative %: 68 %
Platelet Count: 155 10*3/uL (ref 150–400)
RBC: 3.49 MIL/uL — ABNORMAL LOW (ref 3.87–5.11)
RDW: 12.6 % (ref 11.5–15.5)
WBC Count: 15.4 10*3/uL — ABNORMAL HIGH (ref 4.0–10.5)
nRBC: 0.5 % — ABNORMAL HIGH (ref 0.0–0.2)

## 2019-01-06 LAB — CMP (CANCER CENTER ONLY)
ALT: 13 U/L (ref 0–44)
AST: 19 U/L (ref 15–41)
Albumin: 3.5 g/dL (ref 3.5–5.0)
Alkaline Phosphatase: 116 U/L (ref 38–126)
Anion gap: 11 (ref 5–15)
BUN: 6 mg/dL (ref 6–20)
CO2: 26 mmol/L (ref 22–32)
Calcium: 9.4 mg/dL (ref 8.9–10.3)
Chloride: 105 mmol/L (ref 98–111)
Creatinine: 0.75 mg/dL (ref 0.44–1.00)
GFR, Est AFR Am: >60 mL/min (ref >60)
GFR, Estimated: >60 mL/min (ref >60)
Glucose, Bld: 97 mg/dL (ref 70–99)
Potassium: 3.8 mmol/L (ref 3.5–5.1)
Sodium: 142 mmol/L (ref 135–145)
Total Bilirubin: <0.2 mg/dL — ABNORMAL LOW (ref 0.3–1.2)
Total Protein: 6.6 g/dL (ref 6.5–8.1)

## 2019-01-06 MED ORDER — HEPARIN SOD (PORK) LOCK FLUSH 100 UNIT/ML IV SOLN
500.0000 [IU] | Freq: Once | INTRAVENOUS | Status: AC | PRN
  Administered 2019-01-06: 500 [IU]
  Filled 2019-01-06: qty 5

## 2019-01-06 MED ORDER — PALONOSETRON HCL INJECTION 0.25 MG/5ML
INTRAVENOUS | Status: AC
  Filled 2019-01-06: qty 5

## 2019-01-06 MED ORDER — SODIUM CHLORIDE 0.9 % IV SOLN
Freq: Once | INTRAVENOUS | Status: AC
  Administered 2019-01-06: 09:00:00 via INTRAVENOUS
  Filled 2019-01-06: qty 250

## 2019-01-06 MED ORDER — PALONOSETRON HCL INJECTION 0.25 MG/5ML
0.2500 mg | Freq: Once | INTRAVENOUS | Status: AC
  Administered 2019-01-06: 09:00:00 0.25 mg via INTRAVENOUS

## 2019-01-06 MED ORDER — SODIUM CHLORIDE 0.9 % IV SOLN
500.0000 mg/m2 | Freq: Once | INTRAVENOUS | Status: AC
  Administered 2019-01-06: 820 mg via INTRAVENOUS
  Filled 2019-01-06: qty 41

## 2019-01-06 MED ORDER — DOXORUBICIN HCL CHEMO IV INJECTION 2 MG/ML
50.0000 mg/m2 | Freq: Once | INTRAVENOUS | Status: AC
  Administered 2019-01-06: 82 mg via INTRAVENOUS
  Filled 2019-01-06: qty 41

## 2019-01-06 MED ORDER — SODIUM CHLORIDE 0.9% FLUSH
10.0000 mL | INTRAVENOUS | Status: DC | PRN
  Administered 2019-01-06: 10 mL via INTRAVENOUS
  Filled 2019-01-06: qty 10

## 2019-01-06 MED ORDER — SODIUM CHLORIDE 0.9 % IV SOLN
Freq: Once | INTRAVENOUS | Status: AC
  Administered 2019-01-06: 10:00:00 via INTRAVENOUS
  Filled 2019-01-06: qty 5

## 2019-01-06 MED ORDER — SODIUM CHLORIDE 0.9% FLUSH
10.0000 mL | INTRAVENOUS | Status: DC | PRN
  Administered 2019-01-06: 10 mL
  Filled 2019-01-06: qty 10

## 2019-01-06 NOTE — Patient Instructions (Addendum)
[facility] Discharge Instructions for Patients Receiving Chemotherapy  Today you received the following chemotherapy agents Adriamycin; Cytoxin  To help prevent nausea and vomiting after your treatment, we encourage you to take your nausea medication as directed   If you develop nausea and vomiting that is not controlled by your nausea medication, call the clinic.   BELOW ARE SYMPTOMS THAT SHOULD BE REPORTED IMMEDIATELY:  *FEVER GREATER THAN 100.5 F  *CHILLS WITH OR WITHOUT FEVER  NAUSEA AND VOMITING THAT IS NOT CONTROLLED WITH YOUR NAUSEA MEDICATION  *UNUSUAL SHORTNESS OF BREATH  *UNUSUAL BRUISING OR BLEEDING  TENDERNESS IN MOUTH AND THROAT WITH OR WITHOUT PRESENCE OF ULCERS  *URINARY PROBLEMS  *BOWEL PROBLEMS  UNUSUAL RASH Items with * indicate a potential emergency and should be followed up as soon as possible.  Feel free to call the clinic should you have any questions or concerns. The clinic phone number is [phone].  Please show the [facility] at check-in to the Emergency Department and triage nurse.

## 2019-01-06 NOTE — Assessment & Plan Note (Signed)
12/07/2018:Patient palpated a lump in the left breast. Mammogram showed a 1.4cm mass at the 5 o'clock position in the left breast, a left axillary lymph node measuring 2.8cm, and 2 smaller axillary lymph nodes with cortical thickening. 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 Neoadjuvant chemotherapy with Adriamycin and Cytoxan dose dense 4 followed byTaxolweekly 12with carboplatin every 3 weeks 2. Followed by breast conserving surgery withaxillary lymph nodedissection(6 lymph nodes were found on MRI) 3. Followed by adjuvant radiation therapy  Breast MRI on 12/21/18 showed the known 1.5cm mass in the left breast, at least 6 abnormal left axillary lymph nodes, and no right breast malignancy -------------------------------------------------------------------------------------------------------------------------------------------------- Current treatment: Cycle 2 dose dense Adriamycin and Cytoxan Echocardiogram: 12/21/2018: EF 60 to 65% Labs reviewed Chemo toxicities: Denies any nausea or vomiting Severe cytopenias: ANC 0.1: We will decrease the dosage of cycle 2.  Severe vaginal dryness: Patient is seeing a specialist at [facility] Return to clinic in 2 weeks for cycle 3

## 2019-01-09 ENCOUNTER — Other Ambulatory Visit: Payer: Self-pay

## 2019-01-09 ENCOUNTER — Inpatient Hospital Stay: Payer: Commercial Managed Care - PPO

## 2019-01-09 VITALS — BP 139/73 | HR 89 | Temp 97.8°F | Resp 20

## 2019-01-09 DIAGNOSIS — Z171 Estrogen receptor negative status [ER-]: Secondary | ICD-10-CM

## 2019-01-09 DIAGNOSIS — C50512 Malignant neoplasm of lower-outer quadrant of left female breast: Secondary | ICD-10-CM

## 2019-01-09 MED ORDER — PEGFILGRASTIM-JMDB 6 MG/0.6ML ~~LOC~~ SOSY
6.0000 mg | PREFILLED_SYRINGE | Freq: Once | SUBCUTANEOUS | Status: AC
  Administered 2019-01-09: 6 mg via SUBCUTANEOUS

## 2019-01-09 MED ORDER — PEGFILGRASTIM-JMDB 6 MG/0.6ML ~~LOC~~ SOSY
PREFILLED_SYRINGE | SUBCUTANEOUS | Status: AC
  Filled 2019-01-09: qty 0.6

## 2019-01-09 NOTE — Patient Instructions (Signed)

## 2019-01-11 ENCOUNTER — Telehealth: Payer: Self-pay | Admitting: Hematology and Oncology

## 2019-01-11 NOTE — Telephone Encounter (Signed)
Per 11/30 schedule message moved January appointments from Thursdays to Fridays per patient request. Confirmed with patient.

## 2019-01-18 ENCOUNTER — Other Ambulatory Visit: Payer: Self-pay | Admitting: *Deleted

## 2019-01-18 MED ORDER — MAGIC MOUTHWASH W/LIDOCAINE: 5.0000 mL | Freq: Three times a day (TID) | ORAL | 0 refills | Status: DC | PRN

## 2019-01-18 NOTE — Progress Notes (Signed)
Received call from pt stating she is experiencing red ulcers in her mouth which is making it difficult to eat.  Per MD, pt to be prescribed magic mouth wash.  Prescription sent to pt pharmacy on file and pt verbalized understating.

## 2019-01-19 NOTE — Progress Notes (Signed)
Patient Care Team: [name], FNP as PCP - General (Family Medicine) Mauro Kaufmann, RN as Oncology Nurse Navigator Rockwell Germany, RN as Oncology Nurse Navigator  DIAGNOSIS:    ICD-10-CM   1. Malignant neoplasm of lower-outer quadrant of left breast of female, estrogen receptor negative ([place])  C50.512    Z17.1     SUMMARY OF ONCOLOGIC HISTORY: Oncology History  Malignant neoplasm of lower-outer quadrant of left breast of female, estrogen receptor negative ([place])  12/07/2018 Initial Diagnosis   Patient palpated a lump in the left breast. Mammogram showed a 1.4cm mass at the 5 o'clock position in the left breast, a left axillary lymph node measuring 2.8cm, and 2 smaller axillary lymph nodes with cortical thickening. 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   12/14/2018 Cancer Staging   Staging form: Breast, AJCC 8th Edition - Clinical: Stage IIB (cT1c, cN1, cM0, G3, ER-, PR-, HER2-) - Signed by Nicholas Lose, MD on 12/14/2018   12/23/2018 -  Chemotherapy   The patient had DOXOrubicin (ADRIAMYCIN) chemo injection 98 mg, 60 mg/m2 = 98 mg, Intravenous,  Once, 2 of 4 cycles Dose modification: 50 mg/m2 (original dose 60 mg/m2, Cycle 2, Reason: Dose not tolerated) Administration: 98 mg (12/23/2018), 82 mg (01/06/2019) palonosetron (ALOXI) injection 0.25 mg, 0.25 mg, Intravenous,  Once, 2 of 8 cycles Administration: 0.25 mg (12/23/2018), 0.25 mg (01/06/2019) pegfilgrastim-jmdb (FULPHILA) injection 6 mg, 6 mg, Subcutaneous,  Once, 2 of 4 cycles Administration: 6 mg (12/26/2018), 6 mg (01/09/2019) CARBOplatin (PARAPLATIN) 540 mg in sodium chloride 0.9 % 250 mL chemo infusion, 540 mg (100 % of original dose 544.2 mg), Intravenous,  Once, 0 of 4 cycles Dose modification:   (original dose 544.2 mg, Cycle 5) cyclophosphamide (CYTOXAN) 980 mg in sodium chloride 0.9 % 250 mL chemo infusion, 600 mg/m2 = 980 mg, Intravenous,  Once, 2 of 4  cycles Dose modification: 500 mg/m2 (original dose 600 mg/m2, Cycle 2, Reason: Dose not tolerated) Administration: 980 mg (12/23/2018), 820 mg (01/06/2019) PACLitaxel (TAXOL) 132 mg in sodium chloride 0.9 % 250 mL chemo infusion (</= 10m/m2), 80 mg/m2 = 132 mg, Intravenous,  Once, 0 of 4 cycles fosaprepitant (EMEND) 150 mg, dexamethasone (DECADRON) 12 mg in sodium chloride 0.9 % 145 mL IVPB, , Intravenous,  Once, 2 of 8 cycles Administration:  (12/23/2018),  (01/06/2019)  for chemotherapy treatment.      CHIEF COMPLIANT: Cycle 3Adriamycin and Cytoxan  INTERVAL HISTORY: Heather Hebert a 58y.o. 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 She is currently receiving neoadjuvant chemotherapy with dose dense Adriamycin and Cytoxan. She presents to the clinic todayforcycle 3.  She had more fatigue that lasted 3 to 4 days longer than the first cycle.  She had one episode of nausea for which she took Compazine.  She is complaining of aches and pains in different muscles of different parts of the body.  REVIEW OF SYSTEMS:   Constitutional: Denies fevers, chills or abnormal weight loss Eyes: Denies blurriness of vision Ears, nose, mouth, throat, and face: Mouth sores Respiratory: Denies cough, dyspnea or wheezes Cardiovascular: Denies palpitation, chest discomfort Gastrointestinal: Mild intermittent nausea Skin: Denies abnormal skin rashes Lymphatics: Denies new lymphadenopathy or easy bruising Neurological: Denies numbness, tingling or new weaknesses Behavioral/Psych: Mood is stable, no new changes  Extremities: No lower extremity edema Breast: denies any pain or lumps or nodules in either breasts All other systems were reviewed with  the patient and are negative.  I have reviewed the past medical history, past surgical history, social history and family history with the patient and they are unchanged from previous note.  ALLERGIES:  has No Known  Allergies.  MEDICATIONS:  Current Outpatient Medications  Medication Sig Dispense Refill  . Calcium Carb-Cholecalciferol (CALCIUM 600 + D PO) Take 1 tablet by mouth 2 (two) times daily.    . cetirizine (ZYRTEC) 10 MG tablet Take 10 mg by mouth daily.    Marland Kitchen dexamethasone (DECADRON) 4 MG tablet Take 1 tablet day after chemo and 1 tablet 2 days after chemo with food 8 tablet 0  . Flax OIL Take by mouth. One cap per day    . lidocaine-prilocaine (EMLA) cream Apply to affected area once 30 g 3  . LORazepam (ATIVAN) 0.5 MG tablet Take 1 tablet (0.5 mg total) by mouth at bedtime as needed for sleep. 30 tablet 0  . magic mouthwash w/lidocaine SOLN Take 5 mLs by mouth 3 (three) times daily as needed for mouth pain. 240 mL 0  . Multiple Vitamin (MULTIVITAMIN WITH MINERALS) TABS tablet Take 1 tablet by mouth daily.    . Olopatadine HCl (PATADAY OP) Place 1 drop into both eyes daily as needed. For dry eyes    . ondansetron (ZOFRAN) 8 MG tablet Take 1 tablet (8 mg total) by mouth 2 (two) times daily as needed. Start on the third day after chemotherapy. 30 tablet 1  . oxyCODONE (OXY IR/ROXICODONE) 5 MG immediate release tablet Take 1 tablet (5 mg total) by mouth every 6 (six) hours as needed for severe pain. 5 tablet 0  . Probiotic Product (PROBIOTIC PO) Take 1 tablet by mouth every other day.     . prochlorperazine (COMPAZINE) 10 MG tablet Take 1 tablet (10 mg total) by mouth every 6 (six) hours as needed (Nausea or vomiting). 30 tablet 1   No current facility-administered medications for this visit.    PHYSICAL EXAMINATION: ECOG PERFORMANCE STATUS: 1 - Symptomatic but completely ambulatory  Vitals:   01/20/19 1011  BP: 116/73  Pulse: 96  Resp: 17  Temp: 98.2 F (36.8 C)  SpO2: 100%   Filed Weights   01/20/19 1011  Weight: 129 lb 14.4 oz (58.9 kg)    GENERAL: alert, no distress and comfortable SKIN: skin color, texture, turgor are normal, no rashes or significant lesions EYES: normal,  Conjunctiva are pink and non-injected, sclera clear OROPHARYNX: Mouth sores appear to be healing NECK: supple, thyroid normal size, non-tender, without nodularity LYMPH: no palpable lymphadenopathy in the cervical, axillary or inguinal LUNGS: clear to auscultation and percussion with normal breathing effort HEART: regular rate & rhythm and no murmurs and no lower extremity edema ABDOMEN: abdomen soft, non-tender and normal bowel sounds MUSCULOSKELETAL: no cyanosis of digits and no clubbing  NEURO: alert & oriented x 3 with fluent speech, no focal motor/sensory deficits EXTREMITIES: No lower extremity edema  LABORATORY DATA:  I have reviewed the data as listed CMP Latest Ref Rng & Units 01/06/2019 12/30/2018 12/23/2018  Glucose 70 - 99 mg/dL 97 101(H) 210(H)  BUN 6 - 20 mg/dL _0 Creatinine 0.44 - 1.00 mg/dL 0.75 0.71 0.85  Sodium 135 - 145 mmol/L 142 136 141  Potassium 3.5 - 5.1 mmol/L 3.8 4.1 4.0  Chloride 98 - 111 mmol/L 105 102 105  CO2 22 - 32 mmol/L _1 Calcium 8.9 - 10.3 mg/dL 9.4 9.3 9.2  Total Protein 6.5 - 8.1  g/dL 6.6 6.9 7.5  Total Bilirubin 0.3 - 1.2 mg/dL <0.2(L) 0.5 0.4  Alkaline Phos 38 - 126 U/L 116 82 76  AST 15 - 41 U/L 19 13(L) 20  ALT 0 - 44 U/L _0 Lab Results  Component Value Date   WBC 16.7 (H) 01/20/2019   HGB 10.7 (L) 01/20/2019   HCT 32.2 (L) 01/20/2019   MCV 92.5 01/20/2019   PLT 161 01/20/2019   NEUTROABS PENDING 01/20/2019    ASSESSMENT & PLAN:  Malignant neoplasm of lower-outer quadrant of left breast of female, estrogen receptor negative ([place]) 12/07/2018:Patient palpated a lump in the left breast. Mammogram showed a 1.4cm mass at the 5 o'clock position in the left breast, a left axillary lymph node measuring 2.8cm, and 2 smaller axillary lymph nodes with cortical thickening. 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 Neoadjuvant chemotherapy with  Adriamycin and Cytoxan dose dense 4 followed byTaxolweekly 12with carboplatin every 3 weeks 2. Followed by breast conserving surgery withaxillary lymph nodedissection(6 lymph nodes were found on MRI) 3. Followed by adjuvant radiation therapy  Breast MRI on 12/21/18 showed the known 1.5cm mass in the left breast, at least 6 abnormal left axillary lymph nodes, and no right breast malignancy -------------------------------------------------------------------------------------------------------------------------------------------------- Current treatment: Cycle 3dose dense Adriamycin and Cytoxan Echocardiogram: 12/21/2018: EF 60 to 65% Labs reviewed Chemo toxicities:Denies any nausea or vomiting Severe cytopenias: ANC 0.1: We will decrease the dosage of cycle 2. Fatigue: Lasted 3 to 4 days longer than before Mild sore: Using Magic mouthwash as needed Muscle aches and pains  Severe vaginal dryness: Patient is seeing a specialist at [place] Return to clinic in 2 weeks for cycle 4     No orders of the defined types were placed in this encounter.  The patient has a good understanding of the overall plan. she agrees with it. she will call with any problems that may develop before the next visit here.  Nicholas Lose, MD 01/20/2019  Julious Oka Dorshimer, am acting as scribe for Dr. Nicholas Lose.  I have reviewed the above document for accuracy and completeness, and I agree with the above.

## 2019-01-20 ENCOUNTER — Inpatient Hospital Stay: Payer: Commercial Managed Care - PPO | Attending: Hematology and Oncology

## 2019-01-20 ENCOUNTER — Inpatient Hospital Stay (HOSPITAL_BASED_OUTPATIENT_CLINIC_OR_DEPARTMENT_OTHER): Payer: Commercial Managed Care - PPO | Admitting: Hematology and Oncology

## 2019-01-20 ENCOUNTER — Inpatient Hospital Stay: Payer: Commercial Managed Care - PPO

## 2019-01-20 ENCOUNTER — Other Ambulatory Visit: Payer: Self-pay

## 2019-01-20 DIAGNOSIS — Z5111 Encounter for antineoplastic chemotherapy: Secondary | ICD-10-CM | POA: Insufficient documentation

## 2019-01-20 DIAGNOSIS — R110 Nausea: Secondary | ICD-10-CM | POA: Insufficient documentation

## 2019-01-20 DIAGNOSIS — R21 Rash and other nonspecific skin eruption: Secondary | ICD-10-CM | POA: Diagnosis not present

## 2019-01-20 DIAGNOSIS — C50512 Malignant neoplasm of lower-outer quadrant of left female breast: Secondary | ICD-10-CM

## 2019-01-20 DIAGNOSIS — Z171 Estrogen receptor negative status [ER-]: Secondary | ICD-10-CM | POA: Insufficient documentation

## 2019-01-20 DIAGNOSIS — Z7689 Persons encountering health services in other specified circumstances: Secondary | ICD-10-CM | POA: Diagnosis not present

## 2019-01-20 DIAGNOSIS — Z95828 Presence of other vascular implants and grafts: Secondary | ICD-10-CM

## 2019-01-20 DIAGNOSIS — R5383 Other fatigue: Secondary | ICD-10-CM | POA: Insufficient documentation

## 2019-01-20 DIAGNOSIS — Z79899 Other long term (current) drug therapy: Secondary | ICD-10-CM | POA: Insufficient documentation

## 2019-01-20 LAB — CBC WITH DIFFERENTIAL (CANCER CENTER ONLY)
Abs Immature Granulocytes: 2.02 10*3/uL — ABNORMAL HIGH (ref 0.00–0.07)
Basophils Absolute: 0.1 10*3/uL (ref 0.0–0.1)
Basophils Relative: 0 %
Eosinophils Absolute: 0 10*3/uL (ref 0.0–0.5)
Eosinophils Relative: 0 %
HCT: 32.2 % — ABNORMAL LOW (ref 36.0–46.0)
Hemoglobin: 10.7 g/dL — ABNORMAL LOW (ref 12.0–15.0)
Immature Granulocytes: 12 %
Lymphocytes Relative: 6 %
Lymphs Abs: 0.9 10*3/uL (ref 0.7–4.0)
MCH: 30.7 pg (ref 26.0–34.0)
MCHC: 33.2 g/dL (ref 30.0–36.0)
MCV: 92.5 fL (ref 80.0–100.0)
Monocytes Absolute: 1.5 10*3/uL — ABNORMAL HIGH (ref 0.1–1.0)
Monocytes Relative: 9 %
Neutro Abs: 12.2 10*3/uL — ABNORMAL HIGH (ref 1.7–7.7)
Neutrophils Relative %: 73 %
Platelet Count: 161 10*3/uL (ref 150–400)
RBC: 3.48 MIL/uL — ABNORMAL LOW (ref 3.87–5.11)
RDW: 13.4 % (ref 11.5–15.5)
WBC Count: 16.7 10*3/uL — ABNORMAL HIGH (ref 4.0–10.5)
nRBC: 0.4 % — ABNORMAL HIGH (ref 0.0–0.2)

## 2019-01-20 LAB — CMP (CANCER CENTER ONLY)
ALT: 10 U/L (ref 0–44)
AST: 17 U/L (ref 15–41)
Albumin: 3.5 g/dL (ref 3.5–5.0)
Alkaline Phosphatase: 124 U/L (ref 38–126)
Anion gap: 9 (ref 5–15)
BUN: 9 mg/dL (ref 6–20)
CO2: 27 mmol/L (ref 22–32)
Calcium: 8.8 mg/dL — ABNORMAL LOW (ref 8.9–10.3)
Chloride: 105 mmol/L (ref 98–111)
Creatinine: 0.74 mg/dL (ref 0.44–1.00)
GFR, Est AFR Am: >60 mL/min (ref >60)
GFR, Estimated: >60 mL/min (ref >60)
Glucose, Bld: 97 mg/dL (ref 70–99)
Potassium: 3.8 mmol/L (ref 3.5–5.1)
Sodium: 141 mmol/L (ref 135–145)
Total Bilirubin: <0.2 mg/dL — ABNORMAL LOW (ref 0.3–1.2)
Total Protein: 6.7 g/dL (ref 6.5–8.1)

## 2019-01-20 MED ORDER — PALONOSETRON HCL INJECTION 0.25 MG/5ML
0.2500 mg | Freq: Once | INTRAVENOUS | Status: AC
  Administered 2019-01-20: 0.25 mg via INTRAVENOUS

## 2019-01-20 MED ORDER — SODIUM CHLORIDE 0.9 % IV SOLN
500.0000 mg/m2 | Freq: Once | INTRAVENOUS | Status: AC
  Administered 2019-01-20: 820 mg via INTRAVENOUS
  Filled 2019-01-20: qty 41

## 2019-01-20 MED ORDER — SODIUM CHLORIDE 0.9% FLUSH
10.0000 mL | INTRAVENOUS | Status: DC | PRN
  Administered 2019-01-20: 10 mL via INTRAVENOUS
  Filled 2019-01-20: qty 10

## 2019-01-20 MED ORDER — SODIUM CHLORIDE 0.9% FLUSH
10.0000 mL | INTRAVENOUS | Status: DC | PRN
  Administered 2019-01-20: 10 mL
  Filled 2019-01-20: qty 10

## 2019-01-20 MED ORDER — HEPARIN SOD (PORK) LOCK FLUSH 100 UNIT/ML IV SOLN
500.0000 [IU] | Freq: Once | INTRAVENOUS | Status: AC | PRN
  Administered 2019-01-20: 500 [IU]
  Filled 2019-01-20: qty 5

## 2019-01-20 MED ORDER — SODIUM CHLORIDE 0.9 % IV SOLN
Freq: Once | INTRAVENOUS | Status: AC
  Administered 2019-01-20: 11:00:00 via INTRAVENOUS
  Filled 2019-01-20: qty 250

## 2019-01-20 MED ORDER — PALONOSETRON HCL INJECTION 0.25 MG/5ML
INTRAVENOUS | Status: AC
  Filled 2019-01-20: qty 5

## 2019-01-20 MED ORDER — SODIUM CHLORIDE 0.9 % IV SOLN
Freq: Once | INTRAVENOUS | Status: AC
  Administered 2019-01-20: 11:00:00 via INTRAVENOUS
  Filled 2019-01-20: qty 5

## 2019-01-20 MED ORDER — DOXORUBICIN HCL CHEMO IV INJECTION 2 MG/ML
50.0000 mg/m2 | Freq: Once | INTRAVENOUS | Status: AC
  Administered 2019-01-20: 82 mg via INTRAVENOUS
  Filled 2019-01-20: qty 41

## 2019-01-20 NOTE — Assessment & Plan Note (Signed)
12/07/2018:Patient palpated a lump in the left breast. Mammogram showed a 1.4cm mass at the 5 o'clock position in the left breast, a left axillary lymph node measuring 2.8cm, and 2 smaller axillary lymph nodes with cortical thickening. 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 Neoadjuvant chemotherapy with Adriamycin and Cytoxan dose dense 4 followed byTaxolweekly 12with carboplatin every 3 weeks 2. Followed by breast conserving surgery withaxillary lymph nodedissection(6 lymph nodes were found on MRI) 3. Followed by adjuvant radiation therapy  Breast MRI on 12/21/18 showed the known 1.5cm mass in the left breast, at least 6 abnormal left axillary lymph nodes, and no right breast malignancy -------------------------------------------------------------------------------------------------------------------------------------------------- Current treatment: Cycle 3dose dense Adriamycin and Cytoxan Echocardiogram: 12/21/2018: EF 60 to 65% Labs reviewed Chemo toxicities:Denies any nausea or vomiting Severe cytopenias: ANC 0.1: We will decrease the dosage of cycle 2.  Severe vaginal dryness: Patient is seeing a specialist at [facility] Return to clinic in 2 weeks for cycle 4

## 2019-01-20 NOTE — Patient Instructions (Signed)

## 2019-01-20 NOTE — Patient Instructions (Signed)
[place] Discharge Instructions for Patients Receiving Chemotherapy  Today you received the following chemotherapy agents Adriamycin; Cytoxin  To help prevent nausea and vomiting after your treatment, we encourage you to take your nausea medication as directed   If you develop nausea and vomiting that is not controlled by your nausea medication, call the clinic.   BELOW ARE SYMPTOMS THAT SHOULD BE REPORTED IMMEDIATELY:  *FEVER GREATER THAN 100.5 F  *CHILLS WITH OR WITHOUT FEVER  NAUSEA AND VOMITING THAT IS NOT CONTROLLED WITH YOUR NAUSEA MEDICATION  *UNUSUAL SHORTNESS OF BREATH  *UNUSUAL BRUISING OR BLEEDING  TENDERNESS IN MOUTH AND THROAT WITH OR WITHOUT PRESENCE OF ULCERS  *URINARY PROBLEMS  *BOWEL PROBLEMS  UNUSUAL RASH Items with * indicate a potential emergency and should be followed up as soon as possible.  Feel free to call the clinic should you have any questions or concerns. The clinic phone number is [phone].  Please show the Lane at check-in to the Emergency Department and triage nurse.

## 2019-01-23 ENCOUNTER — Inpatient Hospital Stay: Payer: Commercial Managed Care - PPO

## 2019-01-23 ENCOUNTER — Other Ambulatory Visit: Payer: Self-pay

## 2019-01-23 VITALS — BP 92/51 | HR 78 | Temp 98.0°F | Resp 16

## 2019-01-23 DIAGNOSIS — C50512 Malignant neoplasm of lower-outer quadrant of left female breast: Secondary | ICD-10-CM

## 2019-01-23 MED ORDER — PEGFILGRASTIM-JMDB 6 MG/0.6ML ~~LOC~~ SOSY
PREFILLED_SYRINGE | SUBCUTANEOUS | Status: AC
  Filled 2019-01-23: qty 0.6

## 2019-01-23 MED ORDER — PEGFILGRASTIM-JMDB 6 MG/0.6ML ~~LOC~~ SOSY
6.0000 mg | PREFILLED_SYRINGE | Freq: Once | SUBCUTANEOUS | Status: AC
  Administered 2019-01-23: 6 mg via SUBCUTANEOUS

## 2019-01-23 NOTE — Patient Instructions (Signed)

## 2019-02-01 NOTE — Progress Notes (Signed)
Patient Care Team: [name], FNP as PCP - General (Family Medicine) Mauro Kaufmann, RN as Oncology Nurse Navigator Rockwell Germany, RN as Oncology Nurse Navigator  DIAGNOSIS:    ICD-10-CM   1. Malignant neoplasm of lower-outer quadrant of left breast of female, estrogen receptor negative ([place])  C50.512    Z17.1     SUMMARY OF ONCOLOGIC HISTORY: Oncology History  Malignant neoplasm of lower-outer quadrant of left breast of female, estrogen receptor negative ([place])  12/07/2018 Initial Diagnosis   Patient palpated a lump in the left breast. Mammogram showed a 1.4cm mass at the 5 o'clock position in the left breast, a left axillary lymph node measuring 2.8cm, and 2 smaller axillary lymph nodes with cortical thickening. 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   12/14/2018 Cancer Staging   Staging form: Breast, AJCC 8th Edition - Clinical: Stage IIB (cT1c, cN1, cM0, G3, ER-, PR-, HER2-) - Signed by Nicholas Lose, MD on 12/14/2018   12/23/2018 -  Chemotherapy   The patient had DOXOrubicin (ADRIAMYCIN) chemo injection 98 mg, 60 mg/m2 = 98 mg, Intravenous,  Once, 3 of 4 cycles Dose modification: 50 mg/m2 (original dose 60 mg/m2, Cycle 2, Reason: Dose not tolerated) Administration: 98 mg (12/23/2018), 82 mg (01/06/2019), 82 mg (01/20/2019) palonosetron (ALOXI) injection 0.25 mg, 0.25 mg, Intravenous,  Once, 3 of 8 cycles Administration: 0.25 mg (12/23/2018), 0.25 mg (01/06/2019), 0.25 mg (01/20/2019) pegfilgrastim-jmdb (FULPHILA) injection 6 mg, 6 mg, Subcutaneous,  Once, 3 of 4 cycles Administration: 6 mg (12/26/2018), 6 mg (01/09/2019), 6 mg (01/23/2019) CARBOplatin (PARAPLATIN) 540 mg in sodium chloride 0.9 % 250 mL chemo infusion, 540 mg (100 % of original dose 544.2 mg), Intravenous,  Once, 0 of 4 cycles Dose modification:   (original dose 544.2 mg, Cycle 5) cyclophosphamide (CYTOXAN) 980 mg in sodium chloride 0.9 % 250 mL chemo  infusion, 600 mg/m2 = 980 mg, Intravenous,  Once, 3 of 4 cycles Dose modification: 500 mg/m2 (original dose 600 mg/m2, Cycle 2, Reason: Dose not tolerated) Administration: 980 mg (12/23/2018), 820 mg (01/06/2019), 820 mg (01/20/2019) PACLitaxel (TAXOL) 132 mg in sodium chloride 0.9 % 250 mL chemo infusion (</= 66m/m2), 80 mg/m2 = 132 mg, Intravenous,  Once, 0 of 4 cycles fosaprepitant (EMEND) 150 mg, dexamethasone (DECADRON) 12 mg in sodium chloride 0.9 % 145 mL IVPB, , Intravenous,  Once, 3 of 8 cycles Administration:  (12/23/2018),  (01/06/2019),  (01/20/2019)  for chemotherapy treatment.      CHIEF COMPLIANT: Cycle4Adriamycin and Cytoxan  INTERVAL HISTORY: Heather Hebert a 58y.o. 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 She is currently receiving neoadjuvant chemotherapy with dose dense Adriamycin and Cytoxan. She presents to the clinic todayforcycle 4.  She has felt a lot more fatigued with the last cycle of chemo.  She has also noticed a maculopapular rash on the chest wall on the left side as well as some on the scalp.  It is occasionally itchy.   REVIEW OF SYSTEMS:   Constitutional: Denies fevers, chills or abnormal weight loss Eyes: Denies blurriness of vision Ears, nose, mouth, throat, and face: Denies mucositis or sore throat Respiratory: Denies cough, dyspnea or wheezes Cardiovascular: Denies palpitation, chest discomfort Gastrointestinal: Denies nausea, heartburn or change in bowel habits Skin: Maculopapular rash on the chest wall and scalp Lymphatics: Denies new lymphadenopathy or easy bruising Neurological: Denies numbness, tingling or new weaknesses Behavioral/Psych: Mood is stable, no new changes  Extremities: No lower extremity edema Breast: denies any pain or lumps or nodules in either breasts All other systems were reviewed with the patient and are negative.  I have reviewed the past medical history, past surgical history, social  history and family history with the patient and they are unchanged from previous note.  ALLERGIES:  has No Known Allergies.  MEDICATIONS:  Current Outpatient Medications  Medication Sig Dispense Refill  . Calcium Carb-Cholecalciferol (CALCIUM 600 + D PO) Take 1 tablet by mouth 2 (two) times daily.    . cetirizine (ZYRTEC) 10 MG tablet Take 10 mg by mouth daily.    . Flax OIL Take by mouth. One cap per day    . lidocaine-prilocaine (EMLA) cream Apply to affected area once 30 g 3  . LORazepam (ATIVAN) 0.5 MG tablet Take 1 tablet (0.5 mg total) by mouth at bedtime as needed for sleep. 30 tablet 0  . Multiple Vitamin (MULTIVITAMIN WITH MINERALS) TABS tablet Take 1 tablet by mouth daily.    . Olopatadine HCl (PATADAY OP) Place 1 drop into both eyes daily as needed. For dry eyes    . ondansetron (ZOFRAN) 8 MG tablet Take 1 tablet (8 mg total) by mouth 2 (two) times daily as needed. Start on the third day after chemotherapy. 30 tablet 1  . Probiotic Product (PROBIOTIC PO) Take 1 tablet by mouth every other day.     . prochlorperazine (COMPAZINE) 10 MG tablet Take 1 tablet (10 mg total) by mouth every 6 (six) hours as needed (Nausea or vomiting). 30 tablet 1   No current facility-administered medications for this visit.    PHYSICAL EXAMINATION: ECOG PERFORMANCE STATUS: 1 - Symptomatic but completely ambulatory  Vitals:   02/02/19 1048  BP: 113/74  Pulse: 100  Resp: 17  Temp: 98.2 F (36.8 C)  SpO2: 99%   Filed Weights   02/02/19 1048  Weight: 129 lb 6.4 oz (58.7 kg)    GENERAL: alert, no distress and comfortable SKIN: Maculopapular rash in the skin and chest wall and on the scalp EYES: normal, Conjunctiva are pink and non-injected, sclera clear OROPHARYNX: no exudate, no erythema and lips, buccal mucosa, and tongue normal  NECK: supple, thyroid normal size, non-tender, without nodularity LYMPH: no palpable lymphadenopathy in the cervical, axillary or inguinal LUNGS: clear to  auscultation and percussion with normal breathing effort HEART: regular rate & rhythm and no murmurs and no lower extremity edema ABDOMEN: abdomen soft, non-tender and normal bowel sounds MUSCULOSKELETAL: no cyanosis of digits and no clubbing  NEURO: alert & oriented x 3 with fluent speech, no focal motor/sensory deficits EXTREMITIES: No lower extremity edema  LABORATORY DATA:  I have reviewed the data as listed CMP Latest Ref Rng & Units 01/20/2019 01/06/2019 12/30/2018  Glucose 70 - 99 mg/dL 97 97 101(H)  BUN 6 - 20 mg/dL '9 6 14  ' Creatinine 0.44 - 1.00 mg/dL 0.74 0.75 0.71  Sodium 135 - 145 mmol/L 141 142 136  Potassium 3.5 - 5.1 mmol/L 3.8 3.8 4.1  Chloride 98 - 111 mmol/L 105 105 102  CO2 22 - 32 mmol/L '27 26 26  ' Calcium 8.9 - 10.3 mg/dL 8.8(L) 9.4 9.3  Total Protein 6.5 - 8.1 g/dL 6.7 6.6 6.9  Total Bilirubin 0.3 - 1.2 mg/dL <0.2(L) <0.2(L) 0.5  Alkaline Phos 38 - 126 U/L 124 116 82  AST 15 - 41 U/L 17 19 13(L)  ALT 0 - 44 U/L '10 13 13    ' Lab Results  Component  Value Date   WBC 17.0 (H) 02/02/2019   HGB 9.9 (L) 02/02/2019   HCT 29.3 (L) 02/02/2019   MCV 93.6 02/02/2019   PLT 146 (L) 02/02/2019   NEUTROABS 12.6 (H) 02/02/2019    ASSESSMENT & PLAN:  Malignant neoplasm of lower-outer quadrant of left breast of female, estrogen receptor negative ([place]) 12/07/2018:Patient palpated a lump in the left breast. Mammogram showed a 1.4cm mass at the 5 o'clock position in the left breast, a left axillary lymph node measuring 2.8cm, and 2 smaller axillary lymph nodes with cortical thickening. 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 Neoadjuvant chemotherapy with Adriamycin and Cytoxan dose dense 4 followed byTaxolweekly 12with carboplatin every 3 weeks 2. Followed by breast conserving surgery withaxillary lymph nodedissection(6 lymph nodes were found on MRI) 3. Followed by adjuvant radiation  therapy  Breast MRI on 12/21/18 showed the known 1.5cm mass in the left breast, at least 6 abnormal left axillary lymph nodes, and no right breast malignancy -------------------------------------------------------------------------------------------------------------------------------------------------- Current treatment: Cycle4dose dense Adriamycin and Cytoxan Echocardiogram: 12/21/2018: EF 60 to 65% Labs reviewed Chemo toxicities:Denies any nausea or vomiting 1. Severe cytopenias: ANC 0.1: We  decreased the dosage of cycle 2. 2. Fatigue: Lasted 3 to 4 days longer than before 3. Mild sore: Using Magic mouthwash as needed 4. Muscle aches and pains 5. Maculopapular rash on the chest wall and scalp: Encouraged her to use cortisone cream and take Benadryl for itching. I suppose it is related to Cytoxan. Severe vaginal dryness: Patient is seeing a specialist at [facility]  Return to clinic in2 weeks for cycle 1 Taxol with carboplatin    No orders of the defined types were placed in this encounter.  The patient has a good understanding of the overall plan. she agrees with it. she will call with any problems that may develop before the next visit here.  Nicholas Lose, MD 02/02/2019  Julious Oka Dorshimer, am acting as scribe for Dr. Nicholas Lose.  I have reviewed the above document for accuracy and completeness, and I agree with the above.

## 2019-02-02 ENCOUNTER — Inpatient Hospital Stay (HOSPITAL_BASED_OUTPATIENT_CLINIC_OR_DEPARTMENT_OTHER): Payer: Commercial Managed Care - PPO | Admitting: Hematology and Oncology

## 2019-02-02 ENCOUNTER — Inpatient Hospital Stay: Payer: Commercial Managed Care - PPO

## 2019-02-02 ENCOUNTER — Other Ambulatory Visit: Payer: Self-pay

## 2019-02-02 DIAGNOSIS — Z171 Estrogen receptor negative status [ER-]: Secondary | ICD-10-CM | POA: Diagnosis not present

## 2019-02-02 DIAGNOSIS — C50512 Malignant neoplasm of lower-outer quadrant of left female breast: Secondary | ICD-10-CM

## 2019-02-02 DIAGNOSIS — Z95828 Presence of other vascular implants and grafts: Secondary | ICD-10-CM

## 2019-02-02 LAB — CMP (CANCER CENTER ONLY)
ALT: 9 U/L (ref 0–44)
AST: 18 U/L (ref 15–41)
Albumin: 3.4 g/dL — ABNORMAL LOW (ref 3.5–5.0)
Alkaline Phosphatase: 108 U/L (ref 38–126)
Anion gap: 9 (ref 5–15)
BUN: 8 mg/dL (ref 6–20)
CO2: 26 mmol/L (ref 22–32)
Calcium: 8.9 mg/dL (ref 8.9–10.3)
Chloride: 103 mmol/L (ref 98–111)
Creatinine: 0.67 mg/dL (ref 0.44–1.00)
GFR, Est AFR Am: >60 mL/min (ref >60)
GFR, Estimated: >60 mL/min (ref >60)
Glucose, Bld: 99 mg/dL (ref 70–99)
Potassium: 4 mmol/L (ref 3.5–5.1)
Sodium: 138 mmol/L (ref 135–145)
Total Bilirubin: <0.2 mg/dL — ABNORMAL LOW (ref 0.3–1.2)
Total Protein: 6.5 g/dL (ref 6.5–8.1)

## 2019-02-02 LAB — CBC WITH DIFFERENTIAL (CANCER CENTER ONLY)
Abs Immature Granulocytes: 1.74 10*3/uL — ABNORMAL HIGH (ref 0.00–0.07)
Basophils Absolute: 0.1 10*3/uL (ref 0.0–0.1)
Basophils Relative: 1 %
Eosinophils Absolute: 0.1 10*3/uL (ref 0.0–0.5)
Eosinophils Relative: 0 %
HCT: 29.3 % — ABNORMAL LOW (ref 36.0–46.0)
Hemoglobin: 9.9 g/dL — ABNORMAL LOW (ref 12.0–15.0)
Immature Granulocytes: 10 %
Lymphocytes Relative: 3 %
Lymphs Abs: 0.5 10*3/uL — ABNORMAL LOW (ref 0.7–4.0)
MCH: 31.6 pg (ref 26.0–34.0)
MCHC: 33.8 g/dL (ref 30.0–36.0)
MCV: 93.6 fL (ref 80.0–100.0)
Monocytes Absolute: 1.9 10*3/uL — ABNORMAL HIGH (ref 0.1–1.0)
Monocytes Relative: 11 %
Neutro Abs: 12.6 10*3/uL — ABNORMAL HIGH (ref 1.7–7.7)
Neutrophils Relative %: 75 %
Platelet Count: 146 10*3/uL — ABNORMAL LOW (ref 150–400)
RBC: 3.13 MIL/uL — ABNORMAL LOW (ref 3.87–5.11)
RDW: 14.8 % (ref 11.5–15.5)
WBC Count: 17 10*3/uL — ABNORMAL HIGH (ref 4.0–10.5)
nRBC: 0.4 % — ABNORMAL HIGH (ref 0.0–0.2)

## 2019-02-02 MED ORDER — SODIUM CHLORIDE 0.9 % IV SOLN
500.0000 mg/m2 | Freq: Once | INTRAVENOUS | Status: AC
  Administered 2019-02-02: 820 mg via INTRAVENOUS
  Filled 2019-02-02: qty 41

## 2019-02-02 MED ORDER — SODIUM CHLORIDE 0.9% FLUSH
10.0000 mL | Freq: Once | INTRAVENOUS | Status: AC
  Administered 2019-02-02: 10 mL
  Filled 2019-02-02: qty 10

## 2019-02-02 MED ORDER — SODIUM CHLORIDE 0.9 % IV SOLN
Freq: Once | INTRAVENOUS | Status: AC
  Filled 2019-02-02: qty 5

## 2019-02-02 MED ORDER — DOXORUBICIN HCL CHEMO IV INJECTION 2 MG/ML
50.0000 mg/m2 | Freq: Once | INTRAVENOUS | Status: AC
  Administered 2019-02-02: 82 mg via INTRAVENOUS
  Filled 2019-02-02: qty 41

## 2019-02-02 MED ORDER — SODIUM CHLORIDE 0.9 % IV SOLN
Freq: Once | INTRAVENOUS | Status: AC
  Filled 2019-02-02: qty 250

## 2019-02-02 MED ORDER — PALONOSETRON HCL INJECTION 0.25 MG/5ML
0.2500 mg | Freq: Once | INTRAVENOUS | Status: AC
  Administered 2019-02-02: 0.25 mg via INTRAVENOUS

## 2019-02-02 MED ORDER — SODIUM CHLORIDE 0.9% FLUSH
10.0000 mL | INTRAVENOUS | Status: DC | PRN
  Administered 2019-02-02: 10 mL
  Filled 2019-02-02: qty 10

## 2019-02-02 MED ORDER — HEPARIN SOD (PORK) LOCK FLUSH 100 UNIT/ML IV SOLN
500.0000 [IU] | Freq: Once | INTRAVENOUS | Status: AC | PRN
  Administered 2019-02-02: 500 [IU]
  Filled 2019-02-02: qty 5

## 2019-02-02 MED ORDER — PALONOSETRON HCL INJECTION 0.25 MG/5ML
INTRAVENOUS | Status: AC
  Filled 2019-02-02: qty 5

## 2019-02-02 NOTE — Patient Instructions (Signed)

## 2019-02-02 NOTE — Patient Instructions (Signed)
[facility] Discharge Instructions for Patients Receiving Chemotherapy  Today you received the following chemotherapy agents: Adriamycin, Cytoxan  To help prevent nausea and vomiting after your treatment, we encourage you to take your nausea medication as directed.   If you develop nausea and vomiting that is not controlled by your nausea medication, call the clinic.   BELOW ARE SYMPTOMS THAT SHOULD BE REPORTED IMMEDIATELY:  *FEVER GREATER THAN 100.5 F  *CHILLS WITH OR WITHOUT FEVER  NAUSEA AND VOMITING THAT IS NOT CONTROLLED WITH YOUR NAUSEA MEDICATION  *UNUSUAL SHORTNESS OF BREATH  *UNUSUAL BRUISING OR BLEEDING  TENDERNESS IN MOUTH AND THROAT WITH OR WITHOUT PRESENCE OF ULCERS  *URINARY PROBLEMS  *BOWEL PROBLEMS  UNUSUAL RASH Items with * indicate a potential emergency and should be followed up as soon as possible.  Feel free to call the clinic should you have any questions or concerns. The clinic phone number is [phone].  Please show the CHEMO ALERT CARD at check-in to the Emergency Department and triage nurse.   

## 2019-02-02 NOTE — Assessment & Plan Note (Signed)
12/07/2018:Patient palpated a lump in the left breast. Mammogram showed a 1.4cm mass at the 5 o'clock position in the left breast, a left axillary lymph node measuring 2.8cm, and 2 smaller axillary lymph nodes with cortical thickening. 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 Neoadjuvant chemotherapy with Adriamycin and Cytoxan dose dense 4 followed byTaxolweekly 12with carboplatin every 3 weeks 2. Followed by breast conserving surgery withaxillary lymph nodedissection(6 lymph nodes were found on MRI) 3. Followed by adjuvant radiation therapy  Breast MRI on 12/21/18 showed the known 1.5cm mass in the left breast, at least 6 abnormal left axillary lymph nodes, and no right breast malignancy -------------------------------------------------------------------------------------------------------------------------------------------------- Current treatment: Cycle4dose dense Adriamycin and Cytoxan Echocardiogram: 12/21/2018: EF 60 to 65% Labs reviewed Chemo toxicities:Denies any nausea or vomiting Severe cytopenias: ANC 0.1: We will decrease the dosage of cycle 2. Fatigue: Lasted 3 to 4 days longer than before Mild sore: Using Magic mouthwash as needed Muscle aches and pains  Severe vaginal dryness: Patient is seeing a specialist at [facility] Return to clinic in2 weeks for cycle 1 Taxol with carboplatin

## 2019-02-04 ENCOUNTER — Other Ambulatory Visit: Payer: Self-pay

## 2019-02-04 ENCOUNTER — Inpatient Hospital Stay: Payer: Commercial Managed Care - PPO

## 2019-02-04 VITALS — BP 117/74 | HR 72 | Temp 97.0°F | Resp 17

## 2019-02-04 DIAGNOSIS — C50512 Malignant neoplasm of lower-outer quadrant of left female breast: Secondary | ICD-10-CM | POA: Diagnosis not present

## 2019-02-04 MED ORDER — PEGFILGRASTIM-JMDB 6 MG/0.6ML ~~LOC~~ SOSY
6.0000 mg | PREFILLED_SYRINGE | Freq: Once | SUBCUTANEOUS | Status: AC
  Administered 2019-02-04: 6 mg via SUBCUTANEOUS

## 2019-02-04 MED ORDER — PEGFILGRASTIM-JMDB 6 MG/0.6ML ~~LOC~~ SOSY
PREFILLED_SYRINGE | SUBCUTANEOUS | Status: AC
  Filled 2019-02-04: qty 0.6

## 2019-02-04 NOTE — Patient Instructions (Signed)

## 2019-02-14 ENCOUNTER — Encounter: Payer: Self-pay | Admitting: *Deleted

## 2019-02-16 ENCOUNTER — Other Ambulatory Visit: Payer: Commercial Managed Care - PPO

## 2019-02-16 ENCOUNTER — Ambulatory Visit: Payer: Commercial Managed Care - PPO

## 2019-02-16 ENCOUNTER — Encounter: Payer: Self-pay | Admitting: *Deleted

## 2019-02-16 ENCOUNTER — Ambulatory Visit: Payer: Commercial Managed Care - PPO | Admitting: Hematology and Oncology

## 2019-02-16 NOTE — Progress Notes (Signed)
Patient Care Team: [name], FNP as PCP - General (Family Medicine) Mauro Kaufmann, RN as Oncology Nurse Navigator Rockwell [place], RN as Oncology Nurse Navigator  DIAGNOSIS:    ICD-10-CM   1. Malignant neoplasm of lower-outer quadrant of left breast of female, estrogen receptor negative ([place])  C50.512    Z17.1     SUMMARY OF ONCOLOGIC HISTORY: Oncology History  Malignant neoplasm of lower-outer quadrant of left breast of female, estrogen receptor negative ([place])  12/07/2018 Initial Diagnosis   Patient palpated a lump in the left breast. Mammogram showed a 1.4cm mass at the 5 o'clock position in the left breast, a left axillary lymph node measuring 2.8cm, and 2 smaller axillary lymph nodes with cortical thickening. 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   12/14/2018 Cancer Staging   Staging form: Breast, AJCC 8th Edition - Clinical: Stage IIB (cT1c, cN1, cM0, G3, ER-, PR-, HER2-) - Signed by Nicholas Lose, MD on 12/14/2018   12/23/2018 -  Chemotherapy   The patient had DOXOrubicin (ADRIAMYCIN) chemo injection 98 mg, 60 mg/m2 = 98 mg, Intravenous,  Once, 4 of 4 cycles Dose modification: 50 mg/m2 (original dose 60 mg/m2, Cycle 2, Reason: Dose not tolerated) Administration: 98 mg (12/23/2018), 82 mg (01/06/2019), 82 mg (01/20/2019), 82 mg (02/02/2019) palonosetron (ALOXI) injection 0.25 mg, 0.25 mg, Intravenous,  Once, 4 of 8 cycles Administration: 0.25 mg (12/23/2018), 0.25 mg (01/06/2019), 0.25 mg (01/20/2019), 0.25 mg (02/02/2019) pegfilgrastim-jmdb (FULPHILA) injection 6 mg, 6 mg, Subcutaneous,  Once, 4 of 4 cycles Administration: 6 mg (12/26/2018), 6 mg (01/09/2019), 6 mg (01/23/2019), 6 mg (02/04/2019) CARBOplatin (PARAPLATIN) 540 mg in sodium chloride 0.9 % 250 mL chemo infusion, 570 mg (105.4 % of original dose 544.2 mg), Intravenous,  Once, 0 of 4 cycles Dose modification:   (original dose 544.2 mg, Cycle  5) cyclophosphamide (CYTOXAN) 980 mg in sodium chloride 0.9 % 250 mL chemo infusion, 600 mg/m2 = 980 mg, Intravenous,  Once, 4 of 4 cycles Dose modification: 500 mg/m2 (original dose 600 mg/m2, Cycle 2, Reason: Dose not tolerated) Administration: 980 mg (12/23/2018), 820 mg (01/06/2019), 820 mg (01/20/2019), 820 mg (02/02/2019) PACLitaxel (TAXOL) 132 mg in sodium chloride 0.9 % 250 mL chemo infusion (</= '80mg'$ /m2), 80 mg/m2 = 132 mg, Intravenous,  Once, 0 of 4 cycles fosaprepitant (EMEND) 150 mg, dexamethasone (DECADRON) 12 mg in sodium chloride 0.9 % 145 mL IVPB, , Intravenous,  Once, 4 of 8 cycles Administration:  (12/23/2018),  (01/06/2019),  (01/20/2019),  (02/02/2019)  for chemotherapy treatment.      CHIEF COMPLIANT: Cycle1 Taxol  INTERVAL HISTORY: Heather Hebert is a 59 y.o. 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 She is currently receiving neoadjuvant chemotherapy with weekly Taxol after completing 4 cycles of dose dense Adriamycin and Cytoxan. She presents to the clinic todayforcycle 1. She continues to suffer from profound maculopapular rash which has enlarged most of her back now.  She also had profound fatigue and generalized weakness the plan started for third day after chemo and lasted for 3 to 4 days after.  She is starting to feel better just recently.  ALLERGIES:  has No Known Allergies.  MEDICATIONS:  Current Outpatient Medications  Medication Sig Dispense Refill  . Calcium Carb-Cholecalciferol (CALCIUM 600 + D PO) Take 1 tablet by mouth 2 (two) times daily.    . cetirizine (ZYRTEC) 10 MG tablet Take 10 mg by mouth daily.    Marland Kitchen  Flax OIL Take by mouth. One cap per day    . lidocaine-prilocaine (EMLA) cream Apply to affected area once 30 g 3  . LORazepam (ATIVAN) 0.5 MG tablet Take 1 tablet (0.5 mg total) by mouth at bedtime as needed for sleep. 30 tablet 0  . Multiple Vitamin (MULTIVITAMIN WITH MINERALS) TABS tablet Take 1 tablet by mouth daily.     . Olopatadine HCl (PATADAY OP) Place 1 drop into both eyes daily as needed. For dry eyes    . ondansetron (ZOFRAN) 8 MG tablet Take 1 tablet (8 mg total) by mouth 2 (two) times daily as needed. Start on the third day after chemotherapy. 30 tablet 1  . Probiotic Product (PROBIOTIC PO) Take 1 tablet by mouth every other day.     . prochlorperazine (COMPAZINE) 10 MG tablet Take 1 tablet (10 mg total) by mouth every 6 (six) hours as needed (Nausea or vomiting). 30 tablet 1   No current facility-administered medications for this visit.    PHYSICAL EXAMINATION: ECOG PERFORMANCE STATUS: 1 - Symptomatic but completely ambulatory  Vitals:   02/17/19 1018  BP: 98/65  Pulse: 75  Resp: 18  Temp: 98.5 F (36.9 C)  SpO2: 100%   Filed Weights   02/17/19 1018  Weight: 125 lb 4.8 oz (56.8 kg)    LABORATORY DATA:  I have reviewed the data as listed CMP Latest Ref Rng & Units 02/02/2019 01/20/2019 01/06/2019  Glucose 70 - 99 mg/dL 99 97 97  BUN 6 - 20 mg/dL _0 Creatinine 0.44 - 1.00 mg/dL 0.67 0.74 0.75  Sodium 135 - 145 mmol/L 138 141 142  Potassium 3.5 - 5.1 mmol/L 4.0 3.8 3.8  Chloride 98 - 111 mmol/L 103 105 105  CO2 22 - 32 mmol/L _1 Calcium 8.9 - 10.3 mg/dL 8.9 8.8(L) 9.4  Total Protein 6.5 - 8.1 g/dL 6.5 6.7 6.6  Total Bilirubin 0.3 - 1.2 mg/dL <0.2(L) <0.2(L) <0.2(L)  Alkaline Phos 38 - 126 U/L 108 124 116  AST 15 - 41 U/L _2 ALT 0 - 44 U/L _3 Lab Results  Component Value Date   WBC 11.9 (H) 02/17/2019   HGB 8.9 (L) 02/17/2019   HCT 26.2 (L) 02/17/2019   MCV 93.6 02/17/2019   PLT 157 02/17/2019   NEUTROABS PENDING 02/17/2019    ASSESSMENT & PLAN:  Malignant neoplasm of lower-outer quadrant of left breast of female, estrogen receptor negative ([place]) 12/07/2018:Patient palpated a lump in the left breast. Mammogram showed a 1.4cm mass at the 5 o'clock position in the left breast, a left axillary lymph node measuring 2.8cm, and 2 smaller axillary  lymph nodes with cortical thickening. 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 Neoadjuvant chemotherapy with Adriamycin and Cytoxan dose dense 4 followed byTaxolweekly 12with carboplatin every 3 weeks 2. Followed by breast conserving surgery withaxillary lymph nodedissection(6 lymph nodes were found on MRI) 3. Followed by adjuvant radiation therapy  Breast MRI on 12/21/18 showed the known 1.5cm mass in the left breast, at least 6 abnormal left axillary lymph nodes, and no right breast malignancy -------------------------------------------------------------------------------------------------------------------------------------------------- Current treatment: Completed 4 cycles ofdose dense Adriamycin and Cytoxan, today cycle 1 Taxol Echocardiogram: 12/21/2018: EF 60 to 65% Labs reviewed  Chemo toxicities: 1.  Severe fatigue 2.  Maculopapular rash on the chest: It has gotten markedly worse with confluent  rash on the back and the chest as well.  I sent a prescription for Medrol Dosepak today. 3.  Myalgias: Monitoring 4.  Severe vaginal dryness  Return to clinic in 1 week for cycle 2 Taxol on toxicity evaluation    No orders of the defined types were placed in this encounter.  The patient has a good understanding of the overall plan. she agrees with it. she will call with any problems that may develop before the next visit here.  Total time spent: 30 mins including face to face time and time spent for planning, charting and coordination of care  Nicholas Lose, MD 02/17/2019  I, Cloyde Reams Dorshimer, am acting as scribe for Dr. Nicholas Lose.  I have reviewed the above documentation for accuracy and completeness, and I agree with the above.

## 2019-02-17 ENCOUNTER — Inpatient Hospital Stay: Payer: Commercial Managed Care - PPO

## 2019-02-17 ENCOUNTER — Inpatient Hospital Stay: Payer: Commercial Managed Care - PPO | Attending: Hematology and Oncology

## 2019-02-17 ENCOUNTER — Other Ambulatory Visit: Payer: Self-pay

## 2019-02-17 ENCOUNTER — Inpatient Hospital Stay (HOSPITAL_BASED_OUTPATIENT_CLINIC_OR_DEPARTMENT_OTHER): Payer: Commercial Managed Care - PPO | Admitting: Hematology and Oncology

## 2019-02-17 VITALS — BP 98/63 | HR 86 | Temp 99.4°F | Resp 18

## 2019-02-17 DIAGNOSIS — Z171 Estrogen receptor negative status [ER-]: Secondary | ICD-10-CM | POA: Diagnosis not present

## 2019-02-17 DIAGNOSIS — Z87891 Personal history of nicotine dependence: Secondary | ICD-10-CM | POA: Insufficient documentation

## 2019-02-17 DIAGNOSIS — D6959 Other secondary thrombocytopenia: Secondary | ICD-10-CM | POA: Diagnosis not present

## 2019-02-17 DIAGNOSIS — Z79899 Other long term (current) drug therapy: Secondary | ICD-10-CM | POA: Diagnosis not present

## 2019-02-17 DIAGNOSIS — R5383 Other fatigue: Secondary | ICD-10-CM | POA: Insufficient documentation

## 2019-02-17 DIAGNOSIS — N898 Other specified noninflammatory disorders of vagina: Secondary | ICD-10-CM | POA: Diagnosis not present

## 2019-02-17 DIAGNOSIS — Z5111 Encounter for antineoplastic chemotherapy: Secondary | ICD-10-CM | POA: Diagnosis not present

## 2019-02-17 DIAGNOSIS — L270 Generalized skin eruption due to drugs and medicaments taken internally: Secondary | ICD-10-CM | POA: Insufficient documentation

## 2019-02-17 DIAGNOSIS — Z95828 Presence of other vascular implants and grafts: Secondary | ICD-10-CM

## 2019-02-17 DIAGNOSIS — C50512 Malignant neoplasm of lower-outer quadrant of left female breast: Secondary | ICD-10-CM

## 2019-02-17 DIAGNOSIS — M791 Myalgia, unspecified site: Secondary | ICD-10-CM | POA: Insufficient documentation

## 2019-02-17 DIAGNOSIS — R42 Dizziness and giddiness: Secondary | ICD-10-CM | POA: Diagnosis not present

## 2019-02-17 DIAGNOSIS — T451X5A Adverse effect of antineoplastic and immunosuppressive drugs, initial encounter: Secondary | ICD-10-CM | POA: Diagnosis not present

## 2019-02-17 DIAGNOSIS — D72818 Other decreased white blood cell count: Secondary | ICD-10-CM | POA: Insufficient documentation

## 2019-02-17 LAB — CMP (CANCER CENTER ONLY)
ALT: 9 U/L (ref 0–44)
AST: 18 U/L (ref 15–41)
Albumin: 3.1 g/dL — ABNORMAL LOW (ref 3.5–5.0)
Alkaline Phosphatase: 87 U/L (ref 38–126)
Anion gap: 9 (ref 5–15)
BUN: 5 mg/dL — ABNORMAL LOW (ref 6–20)
CO2: 25 mmol/L (ref 22–32)
Calcium: 8.2 mg/dL — ABNORMAL LOW (ref 8.9–10.3)
Chloride: 102 mmol/L (ref 98–111)
Creatinine: 0.69 mg/dL (ref 0.44–1.00)
GFR, Est AFR Am: >60 mL/min (ref >60)
GFR, Estimated: >60 mL/min (ref >60)
Glucose, Bld: 91 mg/dL (ref 70–99)
Potassium: 3.6 mmol/L (ref 3.5–5.1)
Sodium: 136 mmol/L (ref 135–145)
Total Bilirubin: 0.2 mg/dL — ABNORMAL LOW (ref 0.3–1.2)
Total Protein: 6.1 g/dL — ABNORMAL LOW (ref 6.5–8.1)

## 2019-02-17 LAB — CBC WITH DIFFERENTIAL (CANCER CENTER ONLY)
Abs Immature Granulocytes: 0.85 10*3/uL — ABNORMAL HIGH (ref 0.00–0.07)
Basophils Absolute: 0.1 10*3/uL (ref 0.0–0.1)
Basophils Relative: 1 %
Eosinophils Absolute: 0 10*3/uL (ref 0.0–0.5)
Eosinophils Relative: 0 %
HCT: 26.2 % — ABNORMAL LOW (ref 36.0–46.0)
Hemoglobin: 8.9 g/dL — ABNORMAL LOW (ref 12.0–15.0)
Immature Granulocytes: 7 %
Lymphocytes Relative: 3 %
Lymphs Abs: 0.4 10*3/uL — ABNORMAL LOW (ref 0.7–4.0)
MCH: 31.8 pg (ref 26.0–34.0)
MCHC: 34 g/dL (ref 30.0–36.0)
MCV: 93.6 fL (ref 80.0–100.0)
Monocytes Absolute: 1.3 10*3/uL — ABNORMAL HIGH (ref 0.1–1.0)
Monocytes Relative: 11 %
Neutro Abs: 9.3 10*3/uL — ABNORMAL HIGH (ref 1.7–7.7)
Neutrophils Relative %: 78 %
Platelet Count: 157 10*3/uL (ref 150–400)
RBC: 2.8 MIL/uL — ABNORMAL LOW (ref 3.87–5.11)
RDW: 15.8 % — ABNORMAL HIGH (ref 11.5–15.5)
WBC Count: 11.9 10*3/uL — ABNORMAL HIGH (ref 4.0–10.5)
nRBC: 0.3 % — ABNORMAL HIGH (ref 0.0–0.2)

## 2019-02-17 MED ORDER — HEPARIN SOD (PORK) LOCK FLUSH 100 UNIT/ML IV SOLN
500.0000 [IU] | Freq: Once | INTRAVENOUS | Status: AC | PRN
  Administered 2019-02-17: 500 [IU]
  Filled 2019-02-17: qty 5

## 2019-02-17 MED ORDER — SODIUM CHLORIDE 0.9 % IV SOLN
80.0000 mg/m2 | Freq: Once | INTRAVENOUS | Status: AC
  Administered 2019-02-17: 13:00:00 132 mg via INTRAVENOUS
  Filled 2019-02-17: qty 22

## 2019-02-17 MED ORDER — SODIUM CHLORIDE 0.9% FLUSH
10.0000 mL | INTRAVENOUS | Status: DC | PRN
  Administered 2019-02-17: 10 mL via INTRAVENOUS
  Filled 2019-02-17: qty 10

## 2019-02-17 MED ORDER — METHYLPREDNISOLONE 4 MG PO TBPK: ORAL_TABLET | ORAL | 0 refills | Status: DC

## 2019-02-17 MED ORDER — PALONOSETRON HCL INJECTION 0.25 MG/5ML
0.2500 mg | Freq: Once | INTRAVENOUS | Status: AC
  Administered 2019-02-17: 0.25 mg via INTRAVENOUS

## 2019-02-17 MED ORDER — DIPHENHYDRAMINE HCL 50 MG/ML IJ SOLN
INTRAMUSCULAR | Status: AC
  Filled 2019-02-17: qty 1

## 2019-02-17 MED ORDER — PALONOSETRON HCL INJECTION 0.25 MG/5ML
INTRAVENOUS | Status: AC
  Filled 2019-02-17: qty 5

## 2019-02-17 MED ORDER — SODIUM CHLORIDE 0.9 % IV SOLN
Freq: Once | INTRAVENOUS | Status: AC
  Filled 2019-02-17: qty 5

## 2019-02-17 MED ORDER — SODIUM CHLORIDE 0.9 % IV SOLN
573.6000 mg | Freq: Once | INTRAVENOUS | Status: AC
  Administered 2019-02-17: 15:00:00 570 mg via INTRAVENOUS
  Filled 2019-02-17: qty 57

## 2019-02-17 MED ORDER — FAMOTIDINE IN NACL 20-0.9 MG/50ML-% IV SOLN
20.0000 mg | Freq: Once | INTRAVENOUS | Status: AC
  Administered 2019-02-17: 20 mg via INTRAVENOUS

## 2019-02-17 MED ORDER — SODIUM CHLORIDE 0.9% FLUSH
10.0000 mL | INTRAVENOUS | Status: DC | PRN
  Administered 2019-02-17: 10 mL
  Filled 2019-02-17: qty 10

## 2019-02-17 MED ORDER — FAMOTIDINE IN NACL 20-0.9 MG/50ML-% IV SOLN
INTRAVENOUS | Status: AC
  Filled 2019-02-17: qty 50

## 2019-02-17 MED ORDER — SODIUM CHLORIDE 0.9 % IV SOLN
Freq: Once | INTRAVENOUS | Status: AC
  Filled 2019-02-17: qty 250

## 2019-02-17 MED ORDER — DIPHENHYDRAMINE HCL 50 MG/ML IJ SOLN
25.0000 mg | Freq: Once | INTRAMUSCULAR | Status: AC
  Administered 2019-02-17: 11:00:00 25 mg via INTRAVENOUS

## 2019-02-17 NOTE — Assessment & Plan Note (Signed)
12/07/2018:Patient palpated a lump in the left breast. Mammogram showed a 1.4cm mass at the 5 o'clock position in the left breast, a left axillary lymph node measuring 2.8cm, and 2 smaller axillary lymph nodes with cortical thickening. 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 Neoadjuvant chemotherapy with Adriamycin and Cytoxan dose dense 4 followed byTaxolweekly 12with carboplatin every 3 weeks 2. Followed by breast conserving surgery withaxillary lymph nodedissection(6 lymph nodes were found on MRI) 3. Followed by adjuvant radiation therapy  Breast MRI on 12/21/18 showed the known 1.5cm mass in the left breast, at least 6 abnormal left axillary lymph nodes, and no right breast malignancy -------------------------------------------------------------------------------------------------------------------------------------------------- Current treatment: Completed 4 cycles ofdose dense Adriamycin and Cytoxan, today cycle 1 Taxol Echocardiogram: 12/21/2018: EF 60 to 65% Labs reviewed  Chemo toxicities: 1.  Severe fatigue 2.  Maculopapular rash on the chest: Applying cortisone cream for itching most likely related to Cytoxan. 3.  Myalgias: Monitoring 4.  Severe vaginal dryness  Return to clinic in 1 week for cycle 2 Taxol on toxicity evaluation

## 2019-02-17 NOTE — Patient Instructions (Signed)
[facility] Discharge Instructions for Patients Receiving Chemotherapy  Today you received the following chemotherapy agents Taxol; Carboplatin  To help prevent nausea and vomiting after your treatment, we encourage you to take your nausea medication as directed If you develop nausea and vomiting that is not controlled by your nausea medication, call the clinic.   BELOW ARE SYMPTOMS THAT SHOULD BE REPORTED IMMEDIATELY:  *FEVER GREATER THAN 100.5 F  *CHILLS WITH OR WITHOUT FEVER  NAUSEA AND VOMITING THAT IS NOT CONTROLLED WITH YOUR NAUSEA MEDICATION  *UNUSUAL SHORTNESS OF BREATH  *UNUSUAL BRUISING OR BLEEDING  TENDERNESS IN MOUTH AND THROAT WITH OR WITHOUT PRESENCE OF ULCERS  *URINARY PROBLEMS  *BOWEL PROBLEMS  UNUSUAL RASH Items with * indicate a potential emergency and should be followed up as soon as possible.  Feel free to call the clinic should you have any questions or concerns. The clinic phone number is [phone].  Please show the CHEMO ALERT CARD at check-in to the Emergency Department and triage nurse.  Paclitaxel injection What is this medicine? PACLITAXEL (PAK li TAX el) is a chemotherapy drug. It targets fast dividing cells, like cancer cells, and causes these cells to die. This medicine is used to treat ovarian cancer, breast cancer, lung cancer, Kaposi's sarcoma, and other cancers. This medicine may be used for other purposes; ask your health care provider or pharmacist if you have questions. COMMON BRAND NAME(S): Onxol, Taxol What should I tell my health care provider before I take this medicine? They need to know if you have any of these conditions:  history of irregular heartbeat  liver disease  low blood counts, like low white cell, platelet, or red cell counts  lung or breathing disease, like asthma  tingling of the fingers or toes, or other nerve disorder  an unusual or allergic reaction to paclitaxel, alcohol, polyoxyethylated  castor oil, other chemotherapy, other medicines, foods, dyes, or preservatives  pregnant or trying to get pregnant  breast-feeding How should I use this medicine? This drug is given as an infusion into a vein. It is administered in a hospital or clinic by a specially trained health care professional. Talk to your pediatrician regarding the use of this medicine in children. Special care may be needed. Overdosage: If you think you have taken too much of this medicine contact a poison control center or emergency room at once. NOTE: This medicine is only for you. Do not share this medicine with others. What if I miss a dose? It is important not to miss your dose. Call your doctor or health care professional if you are unable to keep an appointment. What may interact with this medicine? Do not take this medicine with any of the following medications:  disulfiram  metronidazole This medicine may also interact with the following medications:  antiviral medicines for hepatitis, HIV or AIDS  certain antibiotics like erythromycin and clarithromycin  certain medicines for fungal infections like ketoconazole and itraconazole  certain medicines for seizures like carbamazepine, phenobarbital, phenytoin  gemfibrozil  nefazodone  rifampin  St. John's wort This list may not describe all possible interactions. Give your health care provider a list of all the medicines, herbs, non-prescription drugs, or dietary supplements you use. Also tell them if you smoke, drink alcohol, or use illegal drugs. Some items may interact with your medicine. What should I watch for while using this medicine? Your condition will be monitored carefully while you are receiving this medicine. You will need important blood work done   while you are taking this medicine. This medicine can cause serious allergic reactions. To reduce your risk you will need to take other medicine(s) before treatment with this medicine. If you  experience allergic reactions like skin rash, itching or hives, swelling of the face, lips, or tongue, tell your doctor or health care professional right away. In some cases, you may be given additional medicines to help with side effects. Follow all directions for their use. This drug may make you feel generally unwell. This is not uncommon, as chemotherapy can affect healthy cells as well as cancer cells. Report any side effects. Continue your course of treatment even though you feel ill unless your doctor tells you to stop. Call your doctor or health care professional for advice if you get a fever, chills or sore throat, or other symptoms of a cold or flu. Do not treat yourself. This drug decreases your body's ability to fight infections. Try to avoid being around people who are sick. This medicine may increase your risk to bruise or bleed. Call your doctor or health care professional if you notice any unusual bleeding. Be careful brushing and flossing your teeth or using a toothpick because you may get an infection or bleed more easily. If you have any dental work done, tell your dentist you are receiving this medicine. Avoid taking products that contain aspirin, acetaminophen, ibuprofen, naproxen, or ketoprofen unless instructed by your doctor. These medicines may hide a fever. Do not become pregnant while taking this medicine. Women should inform their doctor if they wish to become pregnant or think they might be pregnant. There is a potential for serious side effects to an unborn child. Talk to your health care professional or pharmacist for more information. Do not breast-feed an infant while taking this medicine. Men are advised not to father a child while receiving this medicine. This product may contain alcohol. Ask your pharmacist or healthcare provider if this medicine contains alcohol. Be sure to tell all healthcare providers you are taking this medicine. Certain medicines, like metronidazole  and disulfiram, can cause an unpleasant reaction when taken with alcohol. The reaction includes flushing, headache, nausea, vomiting, sweating, and increased thirst. The reaction can last from 30 minutes to several hours. What side effects may I notice from receiving this medicine? Side effects that you should report to your doctor or health care professional as soon as possible:  allergic reactions like skin rash, itching or hives, swelling of the face, lips, or tongue  breathing problems  changes in vision  fast, irregular heartbeat  high or low blood pressure  mouth sores  pain, tingling, numbness in the hands or feet  signs of decreased platelets or bleeding - bruising, pinpoint red spots on the skin, black, tarry stools, blood in the urine  signs of decreased red blood cells - unusually weak or tired, feeling faint or lightheaded, falls  signs of infection - fever or chills, cough, sore throat, pain or difficulty passing urine  signs and symptoms of liver injury like dark yellow or brown urine; general ill feeling or flu-like symptoms; light-colored stools; loss of appetite; nausea; right upper belly pain; unusually weak or tired; yellowing of the eyes or skin  swelling of the ankles, feet, hands  unusually slow heartbeat Side effects that usually do not require medical attention (report to your doctor or health care professional if they continue or are bothersome):  diarrhea  hair loss  loss of appetite  muscle or joint pain  nausea,   vomiting  pain, redness, or irritation at site where injected  tiredness This list may not describe all possible side effects. Call your doctor for medical advice about side effects. You may report side effects to FDA at 1-800-FDA-1088. Where should I keep my medicine? This drug is given in a hospital or clinic and will not be stored at home. NOTE: This sheet is a summary. It may not cover all possible information. If you have  questions about this medicine, talk to your doctor, pharmacist, or health care provider.  2020 Elsevier/Gold Standard (2016-09-29 13:14:55)  Carboplatin injection What is this medicine? CARBOPLATIN (KAR boe pla tin) is a chemotherapy drug. It targets fast dividing cells, like cancer cells, and causes these cells to die. This medicine is used to treat ovarian cancer and many other cancers. This medicine may be used for other purposes; ask your health care provider or pharmacist if you have questions. COMMON BRAND NAME(S): Paraplatin What should I tell my health care provider before I take this medicine? They need to know if you have any of these conditions:  blood disorders  hearing problems  kidney disease  recent or ongoing radiation therapy  an unusual or allergic reaction to carboplatin, cisplatin, other chemotherapy, other medicines, foods, dyes, or preservatives  pregnant or trying to get pregnant  breast-feeding How should I use this medicine? This drug is usually given as an infusion into a vein. It is administered in a hospital or clinic by a specially trained health care professional. Talk to your pediatrician regarding the use of this medicine in children. Special care may be needed. Overdosage: If you think you have taken too much of this medicine contact a poison control center or emergency room at once. NOTE: This medicine is only for you. Do not share this medicine with others. What if I miss a dose? It is important not to miss a dose. Call your doctor or health care professional if you are unable to keep an appointment. What may interact with this medicine?  medicines for seizures  medicines to increase blood counts like filgrastim, pegfilgrastim, sargramostim  some antibiotics like amikacin, gentamicin, neomycin, streptomycin, tobramycin  vaccines Talk to your doctor or health care professional before taking any of these  medicines:  acetaminophen  aspirin  ibuprofen  ketoprofen  naproxen This list may not describe all possible interactions. Give your health care provider a list of all the medicines, herbs, non-prescription drugs, or dietary supplements you use. Also tell them if you smoke, drink alcohol, or use illegal drugs. Some items may interact with your medicine. What should I watch for while using this medicine? Your condition will be monitored carefully while you are receiving this medicine. You will need important blood work done while you are taking this medicine. This drug may make you feel generally unwell. This is not uncommon, as chemotherapy can affect healthy cells as well as cancer cells. Report any side effects. Continue your course of treatment even though you feel ill unless your doctor tells you to stop. In some cases, you may be given additional medicines to help with side effects. Follow all directions for their use. Call your doctor or health care professional for advice if you get a fever, chills or sore throat, or other symptoms of a cold or flu. Do not treat yourself. This drug decreases your body's ability to fight infections. Try to avoid being around people who are sick. This medicine may increase your risk to bruise or bleed. Call   your doctor or health care professional if you notice any unusual bleeding. Be careful brushing and flossing your teeth or using a toothpick because you may get an infection or bleed more easily. If you have any dental work done, tell your dentist you are receiving this medicine. Avoid taking products that contain aspirin, acetaminophen, ibuprofen, naproxen, or ketoprofen unless instructed by your doctor. These medicines may hide a fever. Do not become pregnant while taking this medicine. Women should inform their doctor if they wish to become pregnant or think they might be pregnant. There is a potential for serious side effects to an unborn child. Talk  to your health care professional or pharmacist for more information. Do not breast-feed an infant while taking this medicine. What side effects may I notice from receiving this medicine? Side effects that you should report to your doctor or health care professional as soon as possible:  allergic reactions like skin rash, itching or hives, swelling of the face, lips, or tongue  signs of infection - fever or chills, cough, sore throat, pain or difficulty passing urine  signs of decreased platelets or bleeding - bruising, pinpoint red spots on the skin, black, tarry stools, nosebleeds  signs of decreased red blood cells - unusually weak or tired, fainting spells, lightheadedness  breathing problems  changes in hearing  changes in vision  chest pain  high blood pressure  low blood counts - This drug may decrease the number of white blood cells, red blood cells and platelets. You may be at increased risk for infections and bleeding.  nausea and vomiting  pain, swelling, redness or irritation at the injection site  pain, tingling, numbness in the hands or feet  problems with balance, talking, walking  trouble passing urine or change in the amount of urine Side effects that usually do not require medical attention (report to your doctor or health care professional if they continue or are bothersome):  hair loss  loss of appetite  metallic taste in the mouth or changes in taste This list may not describe all possible side effects. Call your doctor for medical advice about side effects. You may report side effects to FDA at 1-800-FDA-1088. Where should I keep my medicine? This drug is given in a hospital or clinic and will not be stored at home. NOTE: This sheet is a summary. It may not cover all possible information. If you have questions about this medicine, talk to your doctor, pharmacist, or health care provider.  2020 Elsevier/Gold Standard (2007-05-03 14:38:05)   

## 2019-02-17 NOTE — Patient Instructions (Signed)

## 2019-02-20 ENCOUNTER — Telehealth: Payer: Self-pay | Admitting: *Deleted

## 2019-02-20 NOTE — Telephone Encounter (Signed)
Called pt to see how she did with her recent treatment.  She states that she is doing well & no major c/o's.  She reports being on steroids due to a rash.  She knows she can call with any further concerns/questions.

## 2019-02-20 NOTE — Telephone Encounter (Signed)
-----   Message from Priscille Loveless, RN sent at 02/17/2019  1:41 PM EST ----- Regarding: First Chemo f/u [facility] First Taxol/ Carbo f/u

## 2019-02-23 ENCOUNTER — Ambulatory Visit: Payer: Commercial Managed Care - PPO | Admitting: Hematology and Oncology

## 2019-02-23 ENCOUNTER — Other Ambulatory Visit: Payer: Commercial Managed Care - PPO

## 2019-02-23 ENCOUNTER — Ambulatory Visit: Payer: Commercial Managed Care - PPO

## 2019-02-23 ENCOUNTER — Encounter: Payer: Self-pay | Admitting: *Deleted

## 2019-02-23 NOTE — Progress Notes (Signed)
Patient Care Team: [name], FNP as PCP - General (Family Medicine) Mauro Kaufmann, RN as Oncology Nurse Navigator Rockwell Germany, RN as Oncology Nurse Navigator  DIAGNOSIS:    ICD-10-CM   1. Malignant neoplasm of lower-outer quadrant of left breast of female, estrogen receptor negative ([place])  C50.512    Z17.1     SUMMARY OF ONCOLOGIC HISTORY: Oncology History  Malignant neoplasm of lower-outer quadrant of left breast of female, estrogen receptor negative ([place])  12/07/2018 Initial Diagnosis   Patient palpated a lump in the left breast. Mammogram showed a 1.4cm mass at the 5 o'clock position in the left breast, a left axillary lymph node measuring 2.8cm, and 2 smaller axillary lymph nodes with cortical thickening. 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   12/14/2018 Cancer Staging   Staging form: Breast, AJCC 8th Edition - Clinical: Stage IIB (cT1c, cN1, cM0, G3, ER-, PR-, HER2-) - Signed by Nicholas Lose, MD on 12/14/2018   12/23/2018 -  Chemotherapy   The patient had DOXOrubicin (ADRIAMYCIN) chemo injection 98 mg, 60 mg/m2 = 98 mg, Intravenous,  Once, 4 of 4 cycles Dose modification: 50 mg/m2 (original dose 60 mg/m2, Cycle 2, Reason: Dose not tolerated) Administration: 98 mg (12/23/2018), 82 mg (01/06/2019), 82 mg (01/20/2019), 82 mg (02/02/2019) palonosetron (ALOXI) injection 0.25 mg, 0.25 mg, Intravenous,  Once, 5 of 8 cycles Administration: 0.25 mg (12/23/2018), 0.25 mg (02/17/2019), 0.25 mg (01/06/2019), 0.25 mg (01/20/2019), 0.25 mg (02/02/2019) pegfilgrastim-jmdb (FULPHILA) injection 6 mg, 6 mg, Subcutaneous,  Once, 4 of 4 cycles Administration: 6 mg (12/26/2018), 6 mg (01/09/2019), 6 mg (01/23/2019), 6 mg (02/04/2019) CARBOplatin (PARAPLATIN) 570 mg in sodium chloride 0.9 % 250 mL chemo infusion, 570 mg (105.4 % of original dose 544.2 mg), Intravenous,  Once, 1 of 4 cycles Dose modification:   (original dose 544.2 mg,  Cycle 5) Administration: 570 mg (02/17/2019) cyclophosphamide (CYTOXAN) 980 mg in sodium chloride 0.9 % 250 mL chemo infusion, 600 mg/m2 = 980 mg, Intravenous,  Once, 4 of 4 cycles Dose modification: 500 mg/m2 (original dose 600 mg/m2, Cycle 2, Reason: Dose not tolerated) Administration: 980 mg (12/23/2018), 820 mg (01/06/2019), 820 mg (01/20/2019), 820 mg (02/02/2019) PACLitaxel (TAXOL) 132 mg in sodium chloride 0.9 % 250 mL chemo infusion (</= 84m/m2), 80 mg/m2 = 132 mg, Intravenous,  Once, 1 of 4 cycles Administration: 132 mg (02/17/2019) fosaprepitant (EMEND) 150 mg, dexamethasone (DECADRON) 12 mg in sodium chloride 0.9 % 145 mL IVPB, , Intravenous,  Once, 5 of 8 cycles Administration:  (12/23/2018),  (02/17/2019),  (01/06/2019),  (01/20/2019),  (02/02/2019)  for chemotherapy treatment.      CHIEF COMPLIANT: Cycle2 Taxol  INTERVAL HISTORY: Heather Bronaughis a 59y.o. 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 She is currently receiving neoadjuvant chemotherapy with weekly Taxol after completing 4 cycles of dose dense Adriamycin and Cytoxan. She presents to the clinic todayforcycle 2 and a toxicity check.  Rash is markedly better.  She felt dizzy for 2 or 3 days but she has recovered from it.  Denies any nausea or vomiting.  ALLERGIES:  has No Known Allergies.  MEDICATIONS:  Current Outpatient Medications  Medication Sig Dispense Refill  . Calcium Carb-Cholecalciferol (CALCIUM 600 + D PO) Take 1 tablet by mouth 2 (two) times daily.    . cetirizine (ZYRTEC) 10 MG tablet Take 10 mg by mouth daily.    . Flax OIL Take by mouth.  One cap per day    . lidocaine-prilocaine (EMLA) cream Apply to affected area once 30 g 3  . LORazepam (ATIVAN) 0.5 MG tablet Take 1 tablet (0.5 mg total) by mouth at bedtime as needed for sleep. 30 tablet 0  . methylPREDNISolone (MEDROL DOSEPAK) 4 MG TBPK tablet Use as directed 21 tablet 0  . Multiple Vitamin (MULTIVITAMIN WITH MINERALS)  TABS tablet Take 1 tablet by mouth daily.    . Olopatadine HCl (PATADAY OP) Place 1 drop into both eyes daily as needed. For dry eyes    . ondansetron (ZOFRAN) 8 MG tablet Take 1 tablet (8 mg total) by mouth 2 (two) times daily as needed. Start on the third day after chemotherapy. 30 tablet 1  . Probiotic Product (PROBIOTIC PO) Take 1 tablet by mouth every other day.     . prochlorperazine (COMPAZINE) 10 MG tablet Take 1 tablet (10 mg total) by mouth every 6 (six) hours as needed (Nausea or vomiting). 30 tablet 1   No current facility-administered medications for this visit.    PHYSICAL EXAMINATION: ECOG PERFORMANCE STATUS: 1 - Symptomatic but completely ambulatory  Vitals:   02/24/19 1048  BP: (!) 111/48  Pulse: 91  Resp: 18  Temp: 97.9 F (36.6 C)  SpO2: 100%   Filed Weights   02/24/19 1048  Weight: 121 lb 14.4 oz (55.3 kg)    LABORATORY DATA:  I have reviewed the data as listed CMP Latest Ref Rng & Units 02/17/2019 02/02/2019 01/20/2019  Glucose 70 - 99 mg/dL 91 99 97  BUN 6 - 20 mg/dL 5(L) 8 9  Creatinine 0.44 - 1.00 mg/dL 0.69 0.67 0.74  Sodium 135 - 145 mmol/L 136 138 141  Potassium 3.5 - 5.1 mmol/L 3.6 4.0 3.8  Chloride 98 - 111 mmol/L 102 103 105  CO2 22 - 32 mmol/L '25 26 27  ' Calcium 8.9 - 10.3 mg/dL 8.2(L) 8.9 8.8(L)  Total Protein 6.5 - 8.1 g/dL 6.1(L) 6.5 6.7  Total Bilirubin 0.3 - 1.2 mg/dL 0.2(L) <0.2(L) <0.2(L)  Alkaline Phos 38 - 126 U/L 87 108 124  AST 15 - 41 U/L '18 18 17  ' ALT 0 - 44 U/L '9 9 10    ' Lab Results  Component Value Date   WBC 2.8 (L) 02/24/2019   HGB 9.2 (L) 02/24/2019   HCT 27.8 (L) 02/24/2019   MCV 93.0 02/24/2019   PLT 122 (L) 02/24/2019   NEUTROABS 2.0 02/24/2019    ASSESSMENT & PLAN:  Malignant neoplasm of lower-outer quadrant of left breast of female, estrogen receptor negative ([place]) 12/07/2018:Patient palpated a lump in the left breast. Mammogram showed a 1.4cm mass at the 5 o'clock position in the left breast, a left axillary  lymph node measuring 2.8cm, and 2 smaller axillary lymph nodes with cortical thickening. 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 Neoadjuvant chemotherapy with Adriamycin and Cytoxan dose dense 4 followed byTaxolweekly 12with carboplatin every 3 weeks 2. Followed by breast conserving surgery withaxillary lymph nodedissection(6 lymph nodes were found on MRI) 3. Followed by adjuvant radiation therapy  Breast MRI on 12/21/18 showed the known 1.5cm mass in the left breast, at least 6 abnormal left axillary lymph nodes, and no right breast malignancy -------------------------------------------------------------------------------------------------------------------------------------------------- Current treatment: Completed 4 cycles ofdose dense Adriamycin and Cytoxan, today cycle 1 Taxol Echocardiogram: 12/21/2018: EF 60 to 65% Labs reviewed  Chemo toxicities: 1.    Fatigue: Being  monitored: Slight improvement since Adriamycin and Cytoxan is complete 2.    Severe maculopapular rash on the chest: Significantly better 3.  Myalgias: Stable 4.  Severe vaginal dryness: Stable 5.  Dizziness: Related to Taxol 6.  Leukopenia: Today's ANC is 2.0.  We will reduce the dosage of Taxol.  This is probably because last week she got carboplatin and Taxol.  Return to clinic weekly for Taxol every other week for follow-up with me   No orders of the defined types were placed in this encounter.  The patient has a good understanding of the overall plan. she agrees with it. she will call with any problems that may develop before the next visit here.  Total time spent: 30 mins including face to face time and time spent for planning, charting and coordination of care  Nicholas Lose, MD 02/24/2019  I, Cloyde Reams Dorshimer, am acting as scribe for Dr. Nicholas Lose.  I have reviewed the above documentation for accuracy and  completeness, and I agree with the above.

## 2019-02-24 ENCOUNTER — Inpatient Hospital Stay: Payer: Commercial Managed Care - PPO

## 2019-02-24 ENCOUNTER — Inpatient Hospital Stay (HOSPITAL_BASED_OUTPATIENT_CLINIC_OR_DEPARTMENT_OTHER): Payer: Commercial Managed Care - PPO | Admitting: Hematology and Oncology

## 2019-02-24 ENCOUNTER — Other Ambulatory Visit: Payer: Self-pay

## 2019-02-24 DIAGNOSIS — Z171 Estrogen receptor negative status [ER-]: Secondary | ICD-10-CM

## 2019-02-24 DIAGNOSIS — C50512 Malignant neoplasm of lower-outer quadrant of left female breast: Secondary | ICD-10-CM

## 2019-02-24 DIAGNOSIS — Z95828 Presence of other vascular implants and grafts: Secondary | ICD-10-CM

## 2019-02-24 LAB — CMP (CANCER CENTER ONLY)
ALT: 23 U/L (ref 0–44)
AST: 21 U/L (ref 15–41)
Albumin: 3.4 g/dL — ABNORMAL LOW (ref 3.5–5.0)
Alkaline Phosphatase: 78 U/L (ref 38–126)
Anion gap: 10 (ref 5–15)
BUN: 9 mg/dL (ref 6–20)
CO2: 26 mmol/L (ref 22–32)
Calcium: 8.9 mg/dL (ref 8.9–10.3)
Chloride: 103 mmol/L (ref 98–111)
Creatinine: 0.65 mg/dL (ref 0.44–1.00)
GFR, Est AFR Am: >60 mL/min (ref >60)
GFR, Estimated: >60 mL/min (ref >60)
Glucose, Bld: 86 mg/dL (ref 70–99)
Potassium: 3.9 mmol/L (ref 3.5–5.1)
Sodium: 139 mmol/L (ref 135–145)
Total Bilirubin: 0.2 mg/dL — ABNORMAL LOW (ref 0.3–1.2)
Total Protein: 6.3 g/dL — ABNORMAL LOW (ref 6.5–8.1)

## 2019-02-24 LAB — CBC WITH DIFFERENTIAL (CANCER CENTER ONLY)
Abs Immature Granulocytes: 0.05 10*3/uL (ref 0.00–0.07)
Basophils Absolute: 0 10*3/uL (ref 0.0–0.1)
Basophils Relative: 1 %
Eosinophils Absolute: 0 10*3/uL (ref 0.0–0.5)
Eosinophils Relative: 0 %
HCT: 27.8 % — ABNORMAL LOW (ref 36.0–46.0)
Hemoglobin: 9.2 g/dL — ABNORMAL LOW (ref 12.0–15.0)
Immature Granulocytes: 2 %
Lymphocytes Relative: 8 %
Lymphs Abs: 0.2 10*3/uL — ABNORMAL LOW (ref 0.7–4.0)
MCH: 30.8 pg (ref 26.0–34.0)
MCHC: 33.1 g/dL (ref 30.0–36.0)
MCV: 93 fL (ref 80.0–100.0)
Monocytes Absolute: 0.5 10*3/uL (ref 0.1–1.0)
Monocytes Relative: 17 %
Neutro Abs: 2 10*3/uL (ref 1.7–7.7)
Neutrophils Relative %: 72 %
Platelet Count: 122 10*3/uL — ABNORMAL LOW (ref 150–400)
RBC: 2.99 MIL/uL — ABNORMAL LOW (ref 3.87–5.11)
RDW: 15.1 % (ref 11.5–15.5)
WBC Count: 2.8 10*3/uL — ABNORMAL LOW (ref 4.0–10.5)
nRBC: 0 % (ref 0.0–0.2)

## 2019-02-24 MED ORDER — DIPHENHYDRAMINE HCL 50 MG/ML IJ SOLN
INTRAMUSCULAR | Status: AC
  Filled 2019-02-24: qty 1

## 2019-02-24 MED ORDER — ALTEPLASE 2 MG IJ SOLR
2.0000 mg | Freq: Once | INTRAMUSCULAR | Status: AC | PRN
  Administered 2019-02-24: 2 mg
  Filled 2019-02-24: qty 2

## 2019-02-24 MED ORDER — SODIUM CHLORIDE 0.9 % IV SOLN
Freq: Once | INTRAVENOUS | Status: AC
  Filled 2019-02-24: qty 250

## 2019-02-24 MED ORDER — FAMOTIDINE IN NACL 20-0.9 MG/50ML-% IV SOLN
20.0000 mg | Freq: Once | INTRAVENOUS | Status: AC
  Administered 2019-02-24: 20 mg via INTRAVENOUS

## 2019-02-24 MED ORDER — HEPARIN SOD (PORK) LOCK FLUSH 100 UNIT/ML IV SOLN
500.0000 [IU] | Freq: Once | INTRAVENOUS | Status: AC | PRN
  Administered 2019-02-24: 14:00:00 500 [IU]
  Filled 2019-02-24: qty 5

## 2019-02-24 MED ORDER — DIPHENHYDRAMINE HCL 50 MG/ML IJ SOLN
25.0000 mg | Freq: Once | INTRAMUSCULAR | Status: AC
  Administered 2019-02-24: 12:00:00 25 mg via INTRAVENOUS

## 2019-02-24 MED ORDER — FAMOTIDINE IN NACL 20-0.9 MG/50ML-% IV SOLN
INTRAVENOUS | Status: AC
  Filled 2019-02-24: qty 50

## 2019-02-24 MED ORDER — SODIUM CHLORIDE 0.9% FLUSH
10.0000 mL | INTRAVENOUS | Status: DC | PRN
  Administered 2019-02-24: 10 mL
  Filled 2019-02-24: qty 10

## 2019-02-24 MED ORDER — ALTEPLASE 2 MG IJ SOLR
INTRAMUSCULAR | Status: AC
  Filled 2019-02-24: qty 2

## 2019-02-24 MED ORDER — SODIUM CHLORIDE 0.9 % IV SOLN
20.0000 mg | Freq: Once | INTRAVENOUS | Status: AC
  Administered 2019-02-24: 20 mg via INTRAVENOUS
  Filled 2019-02-24: qty 20

## 2019-02-24 MED ORDER — SODIUM CHLORIDE 0.9 % IV SOLN
65.0000 mg/m2 | Freq: Once | INTRAVENOUS | Status: AC
  Administered 2019-02-24: 13:00:00 108 mg via INTRAVENOUS
  Filled 2019-02-24: qty 18

## 2019-02-24 MED ORDER — SODIUM CHLORIDE 0.9% FLUSH
10.0000 mL | INTRAVENOUS | Status: DC | PRN
  Administered 2019-02-24: 10 mL via INTRAVENOUS
  Filled 2019-02-24: qty 10

## 2019-02-24 NOTE — Patient Instructions (Signed)

## 2019-02-24 NOTE — Assessment & Plan Note (Signed)
12/07/2018:Patient palpated a lump in the left breast. Mammogram showed a 1.4cm mass at the 5 o'clock position in the left breast, a left axillary lymph node measuring 2.8cm, and 2 smaller axillary lymph nodes with cortical thickening. 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 Neoadjuvant chemotherapy with Adriamycin and Cytoxan dose dense 4 followed byTaxolweekly 12with carboplatin every 3 weeks 2. Followed by breast conserving surgery withaxillary lymph nodedissection(6 lymph nodes were found on MRI) 3. Followed by adjuvant radiation therapy  Breast MRI on 12/21/18 showed the known 1.5cm mass in the left breast, at least 6 abnormal left axillary lymph nodes, and no right breast malignancy -------------------------------------------------------------------------------------------------------------------------------------------------- Current treatment: Completed 4 cycles ofdose dense Adriamycin and Cytoxan, today cycle 1 Taxol Echocardiogram: 12/21/2018: EF 60 to 65% Labs reviewed  Chemo toxicities: 1.    Fatigue: Being monitored: Slight improvement since Adriamycin and Cytoxan is complete 2.    Severe maculopapular rash on the chest: Slightly improving 3.  Myalgias: Stable 4.  Severe vaginal dryness: Stable  Return to clinic weekly for Taxol every other week for follow-up with me

## 2019-02-24 NOTE — Patient Instructions (Signed)
[facility] Discharge Instructions for Patients Receiving Chemotherapy  Today you received the following chemotherapy agents: paclitaxel.  To help prevent nausea and vomiting after your treatment, we encourage you to take your nausea medication as directed.   If you develop nausea and vomiting that is not controlled by your nausea medication, call the clinic.   BELOW ARE SYMPTOMS THAT SHOULD BE REPORTED IMMEDIATELY:  *FEVER GREATER THAN 100.5 F  *CHILLS WITH OR WITHOUT FEVER  NAUSEA AND VOMITING THAT IS NOT CONTROLLED WITH YOUR NAUSEA MEDICATION  *UNUSUAL SHORTNESS OF BREATH  *UNUSUAL BRUISING OR BLEEDING  TENDERNESS IN MOUTH AND THROAT WITH OR WITHOUT PRESENCE OF ULCERS  *URINARY PROBLEMS  *BOWEL PROBLEMS  UNUSUAL RASH Items with * indicate a potential emergency and should be followed up as soon as possible.  Feel free to call the clinic should you have any questions or concerns. The clinic phone number is [phone].  Please show the CHEMO ALERT CARD at check-in to the Emergency Department and triage nurse.   

## 2019-02-24 NOTE — Progress Notes (Signed)
CATHFLO given at 1038 by Hattie Perch, RN and blood drew peripherally by myself.

## 2019-03-02 ENCOUNTER — Other Ambulatory Visit: Payer: Commercial Managed Care - PPO

## 2019-03-02 ENCOUNTER — Ambulatory Visit: Payer: Commercial Managed Care - PPO

## 2019-03-03 ENCOUNTER — Other Ambulatory Visit: Payer: Self-pay | Admitting: Medical

## 2019-03-03 ENCOUNTER — Inpatient Hospital Stay: Payer: Commercial Managed Care - PPO

## 2019-03-03 ENCOUNTER — Inpatient Hospital Stay (HOSPITAL_BASED_OUTPATIENT_CLINIC_OR_DEPARTMENT_OTHER): Payer: Commercial Managed Care - PPO | Admitting: Medical

## 2019-03-03 ENCOUNTER — Other Ambulatory Visit: Payer: Self-pay

## 2019-03-03 VITALS — BP 131/79 | HR 83 | Temp 98.5°F | Resp 16

## 2019-03-03 DIAGNOSIS — L270 Generalized skin eruption due to drugs and medicaments taken internally: Secondary | ICD-10-CM

## 2019-03-03 DIAGNOSIS — Z171 Estrogen receptor negative status [ER-]: Secondary | ICD-10-CM

## 2019-03-03 DIAGNOSIS — C50512 Malignant neoplasm of lower-outer quadrant of left female breast: Secondary | ICD-10-CM | POA: Diagnosis not present

## 2019-03-03 DIAGNOSIS — Z95828 Presence of other vascular implants and grafts: Secondary | ICD-10-CM

## 2019-03-03 LAB — CBC WITH DIFFERENTIAL (CANCER CENTER ONLY)
Abs Immature Granulocytes: 0.01 10*3/uL (ref 0.00–0.07)
Basophils Absolute: 0 10*3/uL (ref 0.0–0.1)
Basophils Relative: 1 %
Eosinophils Absolute: 0 10*3/uL (ref 0.0–0.5)
Eosinophils Relative: 0 %
HCT: 25.3 % — ABNORMAL LOW (ref 36.0–46.0)
Hemoglobin: 8.3 g/dL — ABNORMAL LOW (ref 12.0–15.0)
Immature Granulocytes: 1 %
Lymphocytes Relative: 19 %
Lymphs Abs: 0.4 10*3/uL — ABNORMAL LOW (ref 0.7–4.0)
MCH: 31.4 pg (ref 26.0–34.0)
MCHC: 32.8 g/dL (ref 30.0–36.0)
MCV: 95.8 fL (ref 80.0–100.0)
Monocytes Absolute: 0.6 10*3/uL (ref 0.1–1.0)
Monocytes Relative: 26 %
Neutro Abs: 1.1 10*3/uL — ABNORMAL LOW (ref 1.7–7.7)
Neutrophils Relative %: 53 %
Platelet Count: 88 10*3/uL — ABNORMAL LOW (ref 150–400)
RBC: 2.64 MIL/uL — ABNORMAL LOW (ref 3.87–5.11)
RDW: 17.6 % — ABNORMAL HIGH (ref 11.5–15.5)
WBC Count: 2.1 10*3/uL — ABNORMAL LOW (ref 4.0–10.5)
nRBC: 0 % (ref 0.0–0.2)

## 2019-03-03 LAB — CMP (CANCER CENTER ONLY)
ALT: 16 U/L (ref 0–44)
AST: 19 U/L (ref 15–41)
Albumin: 3.2 g/dL — ABNORMAL LOW (ref 3.5–5.0)
Alkaline Phosphatase: 70 U/L (ref 38–126)
Anion gap: 8 (ref 5–15)
BUN: 8 mg/dL (ref 6–20)
CO2: 26 mmol/L (ref 22–32)
Calcium: 8.7 mg/dL — ABNORMAL LOW (ref 8.9–10.3)
Chloride: 104 mmol/L (ref 98–111)
Creatinine: 0.59 mg/dL (ref 0.44–1.00)
GFR, Est AFR Am: >60 mL/min (ref >60)
GFR, Estimated: >60 mL/min (ref >60)
Glucose, Bld: 84 mg/dL (ref 70–99)
Potassium: 3.8 mmol/L (ref 3.5–5.1)
Sodium: 138 mmol/L (ref 135–145)
Total Bilirubin: <0.2 mg/dL — ABNORMAL LOW (ref 0.3–1.2)
Total Protein: 6 g/dL — ABNORMAL LOW (ref 6.5–8.1)

## 2019-03-03 MED ORDER — SODIUM CHLORIDE 0.9% FLUSH
10.0000 mL | INTRAVENOUS | Status: DC | PRN
  Filled 2019-03-03: qty 10

## 2019-03-03 MED ORDER — FAMOTIDINE IN NACL 20-0.9 MG/50ML-% IV SOLN: 20.0000 mg | Freq: Once | INTRAVENOUS | Status: DC

## 2019-03-03 MED ORDER — FLUOCINOLONE ACETONIDE 0.025 % EX CREA: TOPICAL_CREAM | Freq: Three times a day (TID) | CUTANEOUS | 1 refills | Status: DC

## 2019-03-03 MED ORDER — SODIUM CHLORIDE 0.9% FLUSH
10.0000 mL | INTRAVENOUS | Status: DC | PRN
  Administered 2019-03-03: 10 mL via INTRAVENOUS
  Filled 2019-03-03: qty 10

## 2019-03-03 MED ORDER — HEPARIN SOD (PORK) LOCK FLUSH 100 UNIT/ML IV SOLN
500.0000 [IU] | Freq: Once | INTRAVENOUS | Status: DC | PRN
  Filled 2019-03-03: qty 5

## 2019-03-03 MED ORDER — SODIUM CHLORIDE 0.9 % IV SOLN
Freq: Once | INTRAVENOUS | Status: DC
  Filled 2019-03-03: qty 250

## 2019-03-03 MED ORDER — DIPHENHYDRAMINE HCL 50 MG/ML IJ SOLN: 25.0000 mg | Freq: Once | INTRAMUSCULAR | Status: DC

## 2019-03-03 MED ORDER — PREDNISONE 5 MG PO TABS: ORAL_TABLET | ORAL | 0 refills | Status: DC

## 2019-03-03 MED ORDER — SODIUM CHLORIDE 0.9 % IV SOLN: 20.0000 mg | Freq: Once | INTRAVENOUS | Status: DC

## 2019-03-03 MED ORDER — SODIUM CHLORIDE 0.9 % IV SOLN: 65.0000 mg/m2 | Freq: Once | INTRAVENOUS | Status: DC

## 2019-03-03 NOTE — Patient Instructions (Signed)
Thrombocytopenia Thrombocytopenia means that you have a low number of platelets in your blood. Platelets are tiny cells in the blood. When you bleed, they clump together at the cut or injury to stop the bleeding. This is called blood clotting. If you do not have enough platelets, it can cause bleeding problems. Some cases of this condition are mild while others are more severe. What are the causes? This condition may be caused by:  Your body not making enough platelets. This may be caused by: ? Your bone marrow not making blood cells (aplastic anemia). ? Cancer in the bone marrow. ? Certain medicines. ? Infection in the bone marrow. ? Drinking a lot of alcohol.  Your body destroying platelets too quickly. This may be caused by: ? Certain immune diseases. ? Certain medicines. ? Certain blood clotting disorders. ? Certain disorders that are passed from parent to child (inherited). ? Certain bleeding disorders. ? Pregnancy. ? Having a spleen that is larger than normal. What are the signs or symptoms?  Bleeding that is not normal.  Nosebleeds.  Heavy menstrual periods.  Blood in the pee (urine) or poop (stool).  A purple-like color to the skin (purpura).  Bruising.  A rash that looks like pinpoint, purple-red spots (petechiae). How is this treated?  Treatment of another condition that is causing the low platelet count.  Medicines to help protect your platelets from being destroyed.  A replacement (transfusion) of platelets to stop or prevent bleeding.  Surgery to remove the spleen. Follow these instructions at home: Activity  Avoid activities that could cause you to get hurt or bruised. Follow instructions about how to prevent falls.  Take care not to cut yourself: ? When you shave. ? When you use scissors, needles, knives, or other tools.  Take care not to burn yourself: ? When you use an iron. ? When you cook. General instructions   Check your skin and the  inside of your mouth for bruises or blood as told by your doctor.  Check to see if there is blood in your spit (sputum), pee, and poop. Do this as told by your doctor.  Do not drink alcohol.  Take over-the-counter and prescription medicines only as told by your doctor.  Do not take any medicines that have aspirin or NSAIDs in them. These medicines can thin your blood and cause you to bleed.  Tell all of your doctors that you have this condition. Be sure to tell your dentist and eye doctor too. Contact a doctor if:  You have bruises and you do not know why. Get help right away if:  You are bleeding anywhere on your body.  You have blood in your spit, pee, or poop. Summary  Thrombocytopenia means that you have a low number of platelets in your blood.  Platelets are needed for blood clotting.  Symptoms of this condition include bleeding that is not normal, and bruising.  Take care not to cut or burn yourself. This information is not intended to replace advice given to you by your health care provider. Make sure you discuss any questions you have with your health care provider. Document Revised: 10/28/2017 Document Reviewed: 10/28/2017 Elsevier Patient Education  2020 [employer] Inc.  

## 2019-03-03 NOTE — Progress Notes (Signed)
Per Dr. Jana Hakim hold treatment today, 03/03/2019 due to platelet count of 88.  RN educated patient on thrombocytopenia precautions.

## 2019-03-03 NOTE — Patient Instructions (Signed)

## 2019-03-07 NOTE — Progress Notes (Signed)
Sympt[facility] Management Clinic Progress Note   Heather Hebert [id number] 1960-07-10 59 y.o.  Heather Hebert is managed by Dr. Nicholas Lose  Actively treated with chemotherapy/immunotherapy/hormonal therapy: yes  Current therapy: Carboplatin and paclitaxel with Fulphila support  Last treated: 02/24/2019 (cycle 5, day 8)  Next scheduled appointment with provider: 03/10/2019  Assessment: Plan:    Malignant neoplasm of lower-outer quadrant of left breast of female, estrogen receptor negative ([place])  Drug rash   ER negative malignant neoplasm of the left breast: The patient continues to be managed by Dr. Langston Masker and is status post cycle 5, day 8 of carboplatin and paclitaxel which was dosed on 02/25/2019.  She is scheduled to return for follow-up on 03/10/2019.  Drug rash suspected secondary to paclitaxel: The patient was given a prednisone taper and was given a prescription for Synalar cream.  Please see After Visit Summary for patient specific instructions.  Future Appointments  Date Time Provider [place]  03/10/2019 10:45 AM [facility] None  03/10/2019 11:00 AM [facility] [place] FLUSH [facility] None  03/10/2019 11:30 AM Nicholas Lose, MD [facility] None  03/10/2019 12:30 PM [facility] INFUSION [facility] None  03/17/2019 11:30 AM [facility] None  03/17/2019 11:45 AM [facility] FLUSH [facility] None  03/17/2019 12:15 PM Nicholas Lose, MD [facility] None  03/17/2019  1:00 PM [facility] INFUSION [facility] None    No orders of the defined types were placed in this encounter.      Subjective:   Patient ID:  Heather Hebert is a 59 y.o. (DOB 14-Oct-1960) female.  Chief Complaint: No chief complaint on file.   HPI Heather Hebert  Is a 59 y.o. female with a diagnosis of an ER negative malignant neoplasm of the left breast. She is managed by Dr. Lindi Adie and is status post cycle 5, day 8 of carboplatin and paclitaxel which was dosed on  02/25/2019.  She was seen on the infusion room today with a report of a diffuse erythematous and mildly pruritic rash over her chest and bilateral upper extremities.  She denies any changes in personal hygiene products.  She denies fevers, chills, sweats, shortness of breath, or chest tightness.  Medications: I have reviewed the patient's current medications.  Allergies: No Known Allergies  Past Medical History:  Diagnosis Date  . Cancer ([place]) 11/2018   left breast ICD  . Family history of breast cancer     Past Surgical History:  Procedure Laterality Date  . CESAREAN SECTION    . LAPAROSCOPIC APPENDECTOMY N/A 08/05/2015   Procedure: APPENDECTOMY LAPAROSCOPIC;  Surgeon: Mickeal Skinner, MD;  Location: [place];  Service: General;  Laterality: N/A;  . PORTACATH PLACEMENT Left 12/22/2018   Procedure: INSERTION PORT-A-CATH;  Surgeon: [name], MD;  Location: [facility];  Service: General;  Laterality: Left;    Family History  Problem Relation Age of Onset  . Breast cancer Cousin        first cousin once removed (father's cousin)    Social History   Socioeconomic History  . Marital status: Married    Spouse name: Not on file  . Number of children: Not on file  . Years of education: Not on file  . Highest education level: Not on file  Occupational History  . Not on file  Tobacco Use  . Smoking status: Former Research scientist (life sciences)  . Smokeless tobacco: Never Used  Substance and Sexual Activity  . Alcohol use: Yes    Comment: "once a month"  . Drug use:  No  . Sexual activity: Not on file  Other Topics Concern  . Not on file  Social History Narrative  . Not on file   Social Determinants of Health   Financial Resource Strain:   . Difficulty of Paying Living Expenses: Not on file  Food Insecurity:   . Worried About Charity fundraiser in the Last Year: Not on file  . Ran Out of Food in the Last Year: Not on file  Transportation Needs:   . Lack of Transportation  (Medical): Not on file  . Lack of Transportation (Non-Medical): Not on file  Physical Activity:   . Days of Exercise per Week: Not on file  . Minutes of Exercise per Session: Not on file  Stress:   . Feeling of Stress : Not on file  Social Connections:   . Frequency of Communication with Friends and Family: Not on file  . Frequency of Social Gatherings with Friends and Family: Not on file  . Attends Religious Services: Not on file  . Active Member of Clubs or Organizations: Not on file  . Attends Archivist Meetings: Not on file  . Marital Status: Not on file  Intimate Partner Violence:   . Fear of Current or Ex-Partner: Not on file  . Emotionally Abused: Not on file  . Physically Abused: Not on file  . Sexually Abused: Not on file    Past Medical History, Surgical history, Social history, and Family history were reviewed and updated as appropriate.   Please see review of systems for further details on the patient's review from today.   Review of Systems:  Review of Systems  Constitutional: Negative for chills, diaphoresis and fever.  HENT: Negative for facial swelling and trouble swallowing.   Respiratory: Negative for cough, chest tightness and shortness of breath.   Cardiovascular: Negative for chest pain.  Skin: Positive for rash.    Objective:   Physical Exam:  There were no vitals taken for this visit. ECOG: 0  Physical Exam Constitutional:      General: She is not in acute distress.    Appearance: She is not diaphoretic.  HENT:     Head: Normocephalic and atraumatic.  Eyes:     General: No scleral icterus.       Right eye: No discharge.        Left eye: No discharge.  Musculoskeletal:        General: No tenderness or deformity.  Skin:    General: Skin is warm and dry.     Findings: Rash (The patient had a diffuse erythematous macular rash over her chest and upper extremities bilaterally.) present. No erythema.  Neurological:     Mental Status:  She is alert.  Psychiatric:        Mood and Affect: Mood normal.        Behavior: Behavior normal.        Thought Content: Thought content normal.        Judgment: Judgment normal.     Lab Review:     Component Value Date/Time   NA 138 03/03/2019 1212   K 3.8 03/03/2019 1212   CL 104 03/03/2019 1212   CO2 26 03/03/2019 1212   GLUCOSE 84 03/03/2019 1212   BUN 8 03/03/2019 1212   CREATININE 0.59 03/03/2019 1212   CALCIUM 8.7 (L) 03/03/2019 1212   PROT 6.0 (L) 03/03/2019 1212   ALBUMIN 3.2 (L) 03/03/2019 1212   AST 19 03/03/2019 1212  ALT 16 03/03/2019 1212   ALKPHOS 70 03/03/2019 1212   BILITOT <0.2 (L) 03/03/2019 1212   GFRNONAA >60 03/03/2019 1212   GFRAA >60 03/03/2019 1212       Component Value Date/Time   WBC 2.1 (L) 03/03/2019 1212   WBC 9.4 08/06/2015 0520   RBC 2.64 (L) 03/03/2019 1212   HGB 8.3 (L) 03/03/2019 1212   HCT 25.3 (L) 03/03/2019 1212   PLT 88 (L) 03/03/2019 1212   MCV 95.8 03/03/2019 1212   MCH 31.4 03/03/2019 1212   MCHC 32.8 03/03/2019 1212   RDW 17.6 (H) 03/03/2019 1212   LYMPHSABS 0.4 (L) 03/03/2019 1212   MONOABS 0.6 03/03/2019 1212   EOSABS 0.0 03/03/2019 1212   BASOSABS 0.0 03/03/2019 1212   -------------------------------  Imaging from last 24 hours (if applicable):  Radiology interpretation: No results found.

## 2019-03-09 ENCOUNTER — Other Ambulatory Visit: Payer: Commercial Managed Care - PPO

## 2019-03-09 ENCOUNTER — Encounter: Payer: Self-pay | Admitting: *Deleted

## 2019-03-09 ENCOUNTER — Ambulatory Visit: Payer: Commercial Managed Care - PPO | Admitting: Hematology and Oncology

## 2019-03-09 ENCOUNTER — Ambulatory Visit: Payer: Commercial Managed Care - PPO

## 2019-03-09 NOTE — Progress Notes (Signed)
Patient Care Team: [name], FNP as PCP - General (Family Medicine) Mauro Kaufmann, RN as Oncology Nurse Navigator Rockwell Germany, RN as Oncology Nurse Navigator  DIAGNOSIS:    ICD-10-CM   1. Malignant neoplasm of lower-outer quadrant of left breast of female, estrogen receptor negative ([place] [place])  C50.512    Z17.1     SUMMARY OF ONCOLOGIC HISTORY: Oncology History  Malignant neoplasm of lower-outer quadrant of left breast of female, estrogen receptor negative ([place])  12/07/2018 Initial Diagnosis   Patient palpated a lump in the left breast. Mammogram showed a 1.4cm mass at the 5 o'clock position in the left breast, a left axillary lymph node measuring 2.8cm, and 2 smaller axillary lymph nodes with cortical thickening. 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   12/14/2018 Cancer Staging   Staging form: Breast, AJCC 8th Edition - Clinical: Stage IIB (cT1c, cN1, cM0, G3, ER-, PR-, HER2-) - Signed by Nicholas Lose, MD on 12/14/2018   12/23/2018 -  Chemotherapy   The patient had DOXOrubicin (ADRIAMYCIN) chemo injection 98 mg, 60 mg/m2 = 98 mg, Intravenous,  Once, 4 of 4 cycles Dose modification: 50 mg/m2 (original dose 60 mg/m2, Cycle 2, Reason: Dose not tolerated) Administration: 98 mg (12/23/2018), 82 mg (01/06/2019), 82 mg (01/20/2019), 82 mg (02/02/2019) palonosetron (ALOXI) injection 0.25 mg, 0.25 mg, Intravenous,  Once, 5 of 8 cycles Administration: 0.25 mg (12/23/2018), 0.25 mg (02/17/2019), 0.25 mg (01/06/2019), 0.25 mg (01/20/2019), 0.25 mg (02/02/2019) pegfilgrastim-jmdb (FULPHILA) injection 6 mg, 6 mg, Subcutaneous,  Once, 4 of 4 cycles Administration: 6 mg (12/26/2018), 6 mg (01/09/2019), 6 mg (01/23/2019), 6 mg (02/04/2019) CARBOplatin (PARAPLATIN) 570 mg in sodium chloride 0.9 % 250 mL chemo infusion, 570 mg (105.4 % of original dose 544.2 mg), Intravenous,  Once, 1 of 4 cycles Dose modification:   (original dose 544.2 mg,  Cycle 5) Administration: 570 mg (02/17/2019) cyclophosphamide (CYTOXAN) 980 mg in sodium chloride 0.9 % 250 mL chemo infusion, 600 mg/m2 = 980 mg, Intravenous,  Once, 4 of 4 cycles Dose modification: 500 mg/m2 (original dose 600 mg/m2, Cycle 2, Reason: Dose not tolerated) Administration: 980 mg (12/23/2018), 820 mg (01/06/2019), 820 mg (01/20/2019), 820 mg (02/02/2019) PACLitaxel (TAXOL) 132 mg in sodium chloride 0.9 % 250 mL chemo infusion (</= 39m/m2), 80 mg/m2 = 132 mg, Intravenous,  Once, 1 of 4 cycles Dose modification: 65 mg/m2 (original dose 80 mg/m2, Cycle 5, Reason: Provider Judgment) Administration: 132 mg (02/17/2019), 108 mg (02/24/2019) fosaprepitant (EMEND) 150 mg, dexamethasone (DECADRON) 12 mg in sodium chloride 0.9 % 145 mL IVPB, , Intravenous,  Once, 5 of 8 cycles Administration:  (12/23/2018),  (02/17/2019),  (01/06/2019),  (01/20/2019),  (02/02/2019)  for chemotherapy treatment.      CHIEF COMPLIANT: Cycle3 Taxol  INTERVAL HISTORY: SKerryann Allaireis a 59y.o. 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 She is currently receiving neoadjuvant chemotherapywith weekly Taxol after completing 4 cycles ofdose dense Adriamycin and Cytoxan. Treatment was held last week due to platelet count of 88. She presents to the clinic todayforcycle3 and a toxicity check.   ALLERGIES:  has No Known Allergies.  MEDICATIONS:  Current Outpatient Medications  Medication Sig Dispense Refill  . Calcium Carb-Cholecalciferol (CALCIUM 600 + D PO) Take 1 tablet by mouth 2 (two) times daily.    . cetirizine (ZYRTEC) 10 MG tablet Take 10 mg by mouth daily.    . Flax OIL Take by mouth. One  cap per day    . fluocinolone (SYNALAR) 0.025 % cream Apply topically 3 (three) times daily. 120 g 1  . lidocaine-prilocaine (EMLA) cream Apply to affected area once 30 g 3  . LORazepam (ATIVAN) 0.5 MG tablet Take 1 tablet (0.5 mg total) by mouth at bedtime as needed for sleep. 30 tablet 0  .  methylPREDNISolone (MEDROL DOSEPAK) 4 MG TBPK tablet Use as directed 21 tablet 0  . Multiple Vitamin (MULTIVITAMIN WITH MINERALS) TABS tablet Take 1 tablet by mouth daily.    . Olopatadine HCl (PATADAY OP) Place 1 drop into both eyes daily as needed. For dry eyes    . ondansetron (ZOFRAN) 8 MG tablet Take 1 tablet (8 mg total) by mouth 2 (two) times daily as needed. Start on the third day after chemotherapy. 30 tablet 1  . predniSONE (DELTASONE) 5 MG tablet 6 tab x 1 day, 5 tab x 1 day, 4 tab x 1 day, 3 tab x 1 day, 2 tab x 1 day, 1 tab x 1 day, stop 21 tablet 0  . Probiotic Product (PROBIOTIC PO) Take 1 tablet by mouth every other day.     . prochlorperazine (COMPAZINE) 10 MG tablet Take 1 tablet (10 mg total) by mouth every 6 (six) hours as needed (Nausea or vomiting). 30 tablet 1   No current facility-administered medications for this visit.    PHYSICAL EXAMINATION: ECOG PERFORMANCE STATUS: 1 - Symptomatic but completely ambulatory  Vitals:   03/10/19 1140  BP: 107/72  Pulse: 81  Resp: 18  Temp: 97.8 F (36.6 C)  SpO2: 100%   Filed Weights   03/10/19 1140  Weight: 126 lb 4.8 oz (57.3 kg)    LABORATORY DATA:  I have reviewed the data as listed CMP Latest Ref Rng & Units 03/03/2019 02/24/2019 02/17/2019  Glucose 70 - 99 mg/dL 84 86 91  BUN 6 - 20 mg/dL 8 9 5(L)  Creatinine 0.44 - 1.00 mg/dL 0.59 0.65 0.69  Sodium 135 - 145 mmol/L 138 139 136  Potassium 3.5 - 5.1 mmol/L 3.8 3.9 3.6  Chloride 98 - 111 mmol/L 104 103 102  CO2 22 - 32 mmol/L '26 26 25  ' Calcium 8.9 - 10.3 mg/dL 8.7(L) 8.9 8.2(L)  Total Protein 6.5 - 8.1 g/dL 6.0(L) 6.3(L) 6.1(L)  Total Bilirubin 0.3 - 1.2 mg/dL <0.2(L) 0.2(L) 0.2(L)  Alkaline Phos 38 - 126 U/L 70 78 87  AST 15 - 41 U/L '19 21 18  ' ALT 0 - 44 U/L '16 23 9    ' Lab Results  Component Value Date   WBC 3.1 (L) 03/10/2019   HGB 9.1 (L) 03/10/2019   HCT 28.3 (L) 03/10/2019   MCV 100.0 03/10/2019   PLT 66 (L) 03/10/2019   NEUTROABS 2.0 03/10/2019     ASSESSMENT & PLAN:  Malignant neoplasm of lower-outer quadrant of left breast of female, estrogen receptor negative ([place]) 12/07/2018:Patient palpated a lump in the left breast. Mammogram showed a 1.4cm mass at the 5 o'clock position in the left breast, a left axillary lymph node measuring 2.8cm, and 2 smaller axillary lymph nodes with cortical thickening. 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 Neoadjuvant chemotherapy with Adriamycin and Cytoxan dose dense 4 followed byTaxolweekly 12with carboplatin every 3 weeks 2. Followed by breast conserving surgery withaxillary lymph nodedissection(6 lymph nodes were found on MRI) 3. Followed by adjuvant radiation therapy  Breast MRI on  12/21/18 showed the known 1.5cm mass in the left breast, at least 6 abnormal left axillary lymph nodes, and no right breast malignancy -------------------------------------------------------------------------------------------------------------------------------------------------- Current treatment:Completed 4 cycles ofdose dense Adriamycin and Cytoxan, today cycle 3 Taxol Echocardiogram: 12/21/2018: EF 60 to 65% Labs reviewed  Chemo toxicities: 1.  Fatigue: Improvement has been seen 2.  Severe maculopapular rash on the chest: Markedly improved 3.Myalgias: Stable 4.Severe vaginal dryness: Stable 5.  Dizziness: Stable 6.  Leukopenia: Today's ANC is 2. 7. Thrombocytopenia: Platelets 66. Will hold off on todays treatment  Return to clinic next week to see if we can resume treatment    No orders of the defined types were placed in this encounter.  The patient has a good understanding of the overall plan. she agrees with it. she will call with any problems that may develop before the next visit here.  Total time spent: 30 mins including face to face time and time spent for planning, charting and coordination of  care  Nicholas Lose, MD 03/10/2019  I, Cloyde Reams Dorshimer, am acting as scribe for Dr. Nicholas Lose.  I have reviewed the above documentation for accuracy and completeness, and I agree with the above.

## 2019-03-10 ENCOUNTER — Inpatient Hospital Stay: Payer: Commercial Managed Care - PPO

## 2019-03-10 ENCOUNTER — Inpatient Hospital Stay (HOSPITAL_BASED_OUTPATIENT_CLINIC_OR_DEPARTMENT_OTHER): Payer: Commercial Managed Care - PPO | Admitting: Hematology and Oncology

## 2019-03-10 ENCOUNTER — Other Ambulatory Visit: Payer: Self-pay

## 2019-03-10 DIAGNOSIS — Z171 Estrogen receptor negative status [ER-]: Secondary | ICD-10-CM

## 2019-03-10 DIAGNOSIS — C50512 Malignant neoplasm of lower-outer quadrant of left female breast: Secondary | ICD-10-CM

## 2019-03-10 DIAGNOSIS — Z95828 Presence of other vascular implants and grafts: Secondary | ICD-10-CM

## 2019-03-10 LAB — CBC WITH DIFFERENTIAL (CANCER CENTER ONLY)
Abs Immature Granulocytes: 0.01 10*3/uL (ref 0.00–0.07)
Basophils Absolute: 0 10*3/uL (ref 0.0–0.1)
Basophils Relative: 1 %
Eosinophils Absolute: 0 10*3/uL (ref 0.0–0.5)
Eosinophils Relative: 0 %
HCT: 28.3 % — ABNORMAL LOW (ref 36.0–46.0)
Hemoglobin: 9.1 g/dL — ABNORMAL LOW (ref 12.0–15.0)
Immature Granulocytes: 0 %
Lymphocytes Relative: 21 %
Lymphs Abs: 0.7 10*3/uL (ref 0.7–4.0)
MCH: 32.2 pg (ref 26.0–34.0)
MCHC: 32.2 g/dL (ref 30.0–36.0)
MCV: 100 fL (ref 80.0–100.0)
Monocytes Absolute: 0.4 10*3/uL (ref 0.1–1.0)
Monocytes Relative: 13 %
Neutro Abs: 2 10*3/uL (ref 1.7–7.7)
Neutrophils Relative %: 65 %
Platelet Count: 66 10*3/uL — ABNORMAL LOW (ref 150–400)
RBC: 2.83 MIL/uL — ABNORMAL LOW (ref 3.87–5.11)
RDW: 20.4 % — ABNORMAL HIGH (ref 11.5–15.5)
WBC Count: 3.1 10*3/uL — ABNORMAL LOW (ref 4.0–10.5)
nRBC: 0 % (ref 0.0–0.2)

## 2019-03-10 LAB — CMP (CANCER CENTER ONLY)
ALT: 16 U/L (ref 0–44)
AST: 17 U/L (ref 15–41)
Albumin: 3.5 g/dL (ref 3.5–5.0)
Alkaline Phosphatase: 70 U/L (ref 38–126)
Anion gap: 9 (ref 5–15)
BUN: 10 mg/dL (ref 6–20)
CO2: 28 mmol/L (ref 22–32)
Calcium: 8.9 mg/dL (ref 8.9–10.3)
Chloride: 105 mmol/L (ref 98–111)
Creatinine: 0.66 mg/dL (ref 0.44–1.00)
GFR, Est AFR Am: >60 mL/min (ref >60)
GFR, Estimated: >60 mL/min (ref >60)
Glucose, Bld: 86 mg/dL (ref 70–99)
Potassium: 3.4 mmol/L — ABNORMAL LOW (ref 3.5–5.1)
Sodium: 142 mmol/L (ref 135–145)
Total Bilirubin: 0.3 mg/dL (ref 0.3–1.2)
Total Protein: 6.2 g/dL — ABNORMAL LOW (ref 6.5–8.1)

## 2019-03-10 MED ORDER — SODIUM CHLORIDE 0.9% FLUSH
10.0000 mL | INTRAVENOUS | Status: DC | PRN
  Administered 2019-03-10: 10 mL via INTRAVENOUS
  Filled 2019-03-10: qty 10

## 2019-03-10 NOTE — Patient Instructions (Signed)

## 2019-03-10 NOTE — Assessment & Plan Note (Addendum)
12/07/2018:Patient palpated a lump in the left breast. Mammogram showed a 1.4cm mass at the 5 o'clock position in the left breast, a left axillary lymph node measuring 2.8cm, and 2 smaller axillary lymph nodes with cortical thickening. 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 Neoadjuvant chemotherapy with Adriamycin and Cytoxan dose dense 4 followed byTaxolweekly 12with carboplatin every 3 weeks 2. Followed by breast conserving surgery withaxillary lymph nodedissection(6 lymph nodes were found on MRI) 3. Followed by adjuvant radiation therapy  Breast MRI on 12/21/18 showed the known 1.5cm mass in the left breast, at least 6 abnormal left axillary lymph nodes, and no right breast malignancy -------------------------------------------------------------------------------------------------------------------------------------------------- Current treatment:Completed 4 cycles ofdose dense Adriamycin and Cytoxan, today cycle 3 Taxol Echocardiogram: 12/21/2018: EF 60 to 65% Labs reviewed  Chemo toxicities: 1.  Fatigue: Improvement has been seen 2.  Severe maculopapular rash on the chest: Markedly improved 3.Myalgias: Stable 4.Severe vaginal dryness: Stable 5.  Dizziness: Stable 6.  Leukopenia: Today's ANC is .    We reduce the dosage of Taxol.  This is probably because last week she got carboplatin and Taxol.  Return to clinic weekly for Taxol every other week for follow-up with me

## 2019-03-16 ENCOUNTER — Encounter: Payer: Self-pay | Admitting: *Deleted

## 2019-03-16 NOTE — Progress Notes (Signed)
Patient Care Team: [name], FNP as PCP - General (Family Medicine) Mauro Kaufmann, RN as Oncology Nurse Navigator Rockwell Germany, RN as Oncology Nurse Navigator  DIAGNOSIS:    ICD-10-CM   1. Malignant neoplasm of lower-outer quadrant of left breast of female, estrogen receptor negative ([place])  C50.512    Z17.1     SUMMARY OF ONCOLOGIC HISTORY: Oncology History  Malignant neoplasm of lower-outer quadrant of left breast of female, estrogen receptor negative ([place])  12/07/2018 Initial Diagnosis   Patient palpated a lump in the left breast. Mammogram showed a 1.4cm mass at the 5 o'clock position in the left breast, a left axillary lymph node measuring 2.8cm, and 2 smaller axillary lymph nodes with cortical thickening. 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   12/14/2018 Cancer Staging   Staging form: Breast, AJCC 8th Edition - Clinical: Stage IIB (cT1c, cN1, cM0, G3, ER-, PR-, HER2-) - Signed by Nicholas Lose, MD on 12/14/2018   12/23/2018 -  Chemotherapy   The patient had DOXOrubicin (ADRIAMYCIN) chemo injection 98 mg, 60 mg/m2 = 98 mg, Intravenous,  Once, 4 of 4 cycles Dose modification: 50 mg/m2 (original dose 60 mg/m2, Cycle 2, Reason: Dose not tolerated) Administration: 98 mg (12/23/2018), 82 mg (01/06/2019), 82 mg (01/20/2019), 82 mg (02/02/2019) palonosetron (ALOXI) injection 0.25 mg, 0.25 mg, Intravenous,  Once, 5 of 8 cycles Administration: 0.25 mg (12/23/2018), 0.25 mg (02/17/2019), 0.25 mg (01/06/2019), 0.25 mg (01/20/2019), 0.25 mg (02/02/2019) pegfilgrastim-jmdb (FULPHILA) injection 6 mg, 6 mg, Subcutaneous,  Once, 4 of 4 cycles Administration: 6 mg (12/26/2018), 6 mg (01/09/2019), 6 mg (01/23/2019), 6 mg (02/04/2019) CARBOplatin (PARAPLATIN) 570 mg in sodium chloride 0.9 % 250 mL chemo infusion, 570 mg (105.4 % of original dose 544.2 mg), Intravenous,  Once, 1 of 4 cycles Dose modification:   (original dose 544.2 mg,  Cycle 5) Administration: 570 mg (02/17/2019) cyclophosphamide (CYTOXAN) 980 mg in sodium chloride 0.9 % 250 mL chemo infusion, 600 mg/m2 = 980 mg, Intravenous,  Once, 4 of 4 cycles Dose modification: 500 mg/m2 (original dose 600 mg/m2, Cycle 2, Reason: Dose not tolerated) Administration: 980 mg (12/23/2018), 820 mg (01/06/2019), 820 mg (01/20/2019), 820 mg (02/02/2019) PACLitaxel (TAXOL) 132 mg in sodium chloride 0.9 % 250 mL chemo infusion (</= 23m/m2), 80 mg/m2 = 132 mg, Intravenous,  Once, 1 of 4 cycles Dose modification: 65 mg/m2 (original dose 80 mg/m2, Cycle 5, Reason: Provider Judgment) Administration: 132 mg (02/17/2019), 108 mg (02/24/2019) fosaprepitant (EMEND) 150 mg, dexamethasone (DECADRON) 12 mg in sodium chloride 0.9 % 145 mL IVPB, , Intravenous,  Once, 5 of 8 cycles Administration:  (12/23/2018),  (02/17/2019),  (01/06/2019),  (01/20/2019),  (02/02/2019)  for chemotherapy treatment.      CHIEF COMPLIANT: Cycle3Taxol: Being held for thrombocytopenia again  INTERVAL HISTORY: Heather Moraceis a 59y.o. 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 She is currently receiving neoadjuvant chemotherapywith weekly Taxol after completing 4 cycles ofdose dense Adriamycin and Cytoxan. Treatment was held again last week due to platelets 66. She presents to the clinic todayforcycle3. The rash on her chest has resolved.  She used steroid cream for this.  ALLERGIES:  has No Known Allergies.  MEDICATIONS:  Current Outpatient Medications  Medication Sig Dispense Refill  . Calcium Carb-Cholecalciferol (CALCIUM 600 + D PO) Take 1 tablet by mouth 2 (two) times daily.    . cetirizine (ZYRTEC) 10 MG tablet Take 10 mg by  mouth daily.    . Flax OIL Take by mouth. One cap per day    . fluocinolone (SYNALAR) 0.025 % cream Apply topically 3 (three) times daily. 120 g 1  . lidocaine-prilocaine (EMLA) cream Apply to affected area once 30 g 3  . LORazepam (ATIVAN) 0.5 MG tablet  Take 1 tablet (0.5 mg total) by mouth at bedtime as needed for sleep. 30 tablet 0  . methylPREDNISolone (MEDROL DOSEPAK) 4 MG TBPK tablet Use as directed 21 tablet 0  . Multiple Vitamin (MULTIVITAMIN WITH MINERALS) TABS tablet Take 1 tablet by mouth daily.    . Olopatadine HCl (PATADAY OP) Place 1 drop into both eyes daily as needed. For dry eyes    . ondansetron (ZOFRAN) 8 MG tablet Take 1 tablet (8 mg total) by mouth 2 (two) times daily as needed. Start on the third day after chemotherapy. 30 tablet 1  . predniSONE (DELTASONE) 5 MG tablet 6 tab x 1 day, 5 tab x 1 day, 4 tab x 1 day, 3 tab x 1 day, 2 tab x 1 day, 1 tab x 1 day, stop 21 tablet 0  . Probiotic Product (PROBIOTIC PO) Take 1 tablet by mouth every other day.     . prochlorperazine (COMPAZINE) 10 MG tablet Take 1 tablet (10 mg total) by mouth every 6 (six) hours as needed (Nausea or vomiting). 30 tablet 1   No current facility-administered medications for this visit.    PHYSICAL EXAMINATION: ECOG PERFORMANCE STATUS: 1 - Symptomatic but completely ambulatory  Vitals:   03/17/19 1236  BP: 124/83  Pulse: 97  Resp: 17  Temp: 98.3 F (36.8 C)  SpO2: 100%   Filed Weights   03/17/19 1236  Weight: 124 lb 11.2 oz (56.6 kg)    LABORATORY DATA:  I have reviewed the data as listed CMP Latest Ref Rng & Units 03/10/2019 03/03/2019 02/24/2019  Glucose 70 - 99 mg/dL 86 84 86  BUN 6 - 20 mg/dL '10 8 9  ' Creatinine 0.44 - 1.00 mg/dL 0.66 0.59 0.65  Sodium 135 - 145 mmol/L 142 138 139  Potassium 3.5 - 5.1 mmol/L 3.4(L) 3.8 3.9  Chloride 98 - 111 mmol/L 105 104 103  CO2 22 - 32 mmol/L '28 26 26  ' Calcium 8.9 - 10.3 mg/dL 8.9 8.7(L) 8.9  Total Protein 6.5 - 8.1 g/dL 6.2(L) 6.0(L) 6.3(L)  Total Bilirubin 0.3 - 1.2 mg/dL 0.3 <0.2(L) 0.2(L)  Alkaline Phos 38 - 126 U/L 70 70 78  AST 15 - 41 U/L '17 19 21  ' ALT 0 - 44 U/L '16 16 23    ' Lab Results  Component Value Date   WBC 4.0 03/17/2019   HGB 9.2 (L) 03/17/2019   HCT 28.2 (L) 03/17/2019     MCV 98.6 03/17/2019   PLT 65 (L) 03/17/2019   NEUTROABS 2.7 03/17/2019    ASSESSMENT & PLAN:  Malignant neoplasm of lower-outer quadrant of left breast of female, estrogen receptor negative ([place]) 12/07/2018:Patient palpated a lump in the left breast. Mammogram showed a 1.4cm mass at the 5 o'clock position in the left breast, a left axillary lymph node measuring 2.8cm, and 2 smaller axillary lymph nodes with cortical thickening. 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 Neoadjuvant chemotherapy with Adriamycin and Cytoxan dose dense 4 followed byTaxolweekly 12with carboplatin every 3 weeks 2. Followed by breast conserving surgery withaxillary lymph nodedissection(6 lymph nodes were found  on MRI) 3. Followed by adjuvant radiation therapy  Breast MRI on 12/21/18 showed the known 1.5cm mass in the left breast, at least 6 abnormal left axillary lymph nodes, and no right breast malignancy -------------------------------------------------------------------------------------------------------------------------------------------------- Current treatment:Completed 4 cycles ofdose dense Adriamycin and Cytoxan, today is cycle 3 Taxol (being held for thrombocytopenia) Echocardiogram: 12/21/2018: EF 60 to 65% Labs reviewed  Chemo toxicities: 1.Fatigue:  Much better 2.Severe maculopapular rash on the chest:  Resolved 3.Myalgias:Stable 4.Severe vaginal dryness: Stable 5.Dizziness:  No further issues 6.Leukopenia: Today's ANC is 2.7. 7. Thrombocytopenia: Platelets 65 today.  I discussed with her that at these levels monitor without today and that we hope that by next week it will be over 75 and then we can resume Taxol at a lower dose of 50 mg meter square.  She will not receive carboplatin again.  Return to clinic weekly for labs and chemotherapy    No orders of the defined types were  placed in this encounter.  The patient has a good understanding of the overall plan. she agrees with it. she will call with any problems that may develop before the next visit here.  Total time spent: 30 mins including face to face time and time spent for planning, charting and coordination of care  Nicholas Lose, MD 03/17/2019  I, Cloyde Reams Dorshimer, am acting as scribe for Dr. Nicholas Lose.  I have reviewed the above documentation for accuracy and completeness, and I agree with the above.

## 2019-03-17 ENCOUNTER — Inpatient Hospital Stay: Payer: Commercial Managed Care - PPO | Attending: Hematology and Oncology

## 2019-03-17 ENCOUNTER — Inpatient Hospital Stay (HOSPITAL_BASED_OUTPATIENT_CLINIC_OR_DEPARTMENT_OTHER): Payer: Commercial Managed Care - PPO | Admitting: Hematology and Oncology

## 2019-03-17 ENCOUNTER — Inpatient Hospital Stay: Payer: Commercial Managed Care - PPO

## 2019-03-17 ENCOUNTER — Other Ambulatory Visit: Payer: Self-pay

## 2019-03-17 DIAGNOSIS — R5383 Other fatigue: Secondary | ICD-10-CM | POA: Insufficient documentation

## 2019-03-17 DIAGNOSIS — C50512 Malignant neoplasm of lower-outer quadrant of left female breast: Secondary | ICD-10-CM | POA: Insufficient documentation

## 2019-03-17 DIAGNOSIS — T451X5A Adverse effect of antineoplastic and immunosuppressive drugs, initial encounter: Secondary | ICD-10-CM | POA: Diagnosis not present

## 2019-03-17 DIAGNOSIS — M791 Myalgia, unspecified site: Secondary | ICD-10-CM | POA: Diagnosis not present

## 2019-03-17 DIAGNOSIS — Z7952 Long term (current) use of systemic steroids: Secondary | ICD-10-CM | POA: Insufficient documentation

## 2019-03-17 DIAGNOSIS — D6481 Anemia due to antineoplastic chemotherapy: Secondary | ICD-10-CM | POA: Insufficient documentation

## 2019-03-17 DIAGNOSIS — Z87891 Personal history of nicotine dependence: Secondary | ICD-10-CM | POA: Diagnosis not present

## 2019-03-17 DIAGNOSIS — D6959 Other secondary thrombocytopenia: Secondary | ICD-10-CM | POA: Insufficient documentation

## 2019-03-17 DIAGNOSIS — D701 Agranulocytosis secondary to cancer chemotherapy: Secondary | ICD-10-CM | POA: Insufficient documentation

## 2019-03-17 DIAGNOSIS — Z5111 Encounter for antineoplastic chemotherapy: Secondary | ICD-10-CM | POA: Diagnosis not present

## 2019-03-17 DIAGNOSIS — Z79899 Other long term (current) drug therapy: Secondary | ICD-10-CM | POA: Diagnosis not present

## 2019-03-17 DIAGNOSIS — Z171 Estrogen receptor negative status [ER-]: Secondary | ICD-10-CM | POA: Insufficient documentation

## 2019-03-17 DIAGNOSIS — Z452 Encounter for adjustment and management of vascular access device: Secondary | ICD-10-CM | POA: Insufficient documentation

## 2019-03-17 DIAGNOSIS — Z95828 Presence of other vascular implants and grafts: Secondary | ICD-10-CM

## 2019-03-17 LAB — CBC WITH DIFFERENTIAL (CANCER CENTER ONLY)
Abs Immature Granulocytes: 0.02 10*3/uL (ref 0.00–0.07)
Basophils Absolute: 0 10*3/uL (ref 0.0–0.1)
Basophils Relative: 1 %
Eosinophils Absolute: 0.1 10*3/uL (ref 0.0–0.5)
Eosinophils Relative: 2 %
HCT: 28.2 % — ABNORMAL LOW (ref 36.0–46.0)
Hemoglobin: 9.2 g/dL — ABNORMAL LOW (ref 12.0–15.0)
Immature Granulocytes: 1 %
Lymphocytes Relative: 17 %
Lymphs Abs: 0.7 10*3/uL (ref 0.7–4.0)
MCH: 32.2 pg (ref 26.0–34.0)
MCHC: 32.6 g/dL (ref 30.0–36.0)
MCV: 98.6 fL (ref 80.0–100.0)
Monocytes Absolute: 0.5 10*3/uL (ref 0.1–1.0)
Monocytes Relative: 12 %
Neutro Abs: 2.7 10*3/uL (ref 1.7–7.7)
Neutrophils Relative %: 67 %
Platelet Count: 65 10*3/uL — ABNORMAL LOW (ref 150–400)
RBC: 2.86 MIL/uL — ABNORMAL LOW (ref 3.87–5.11)
RDW: 20.6 % — ABNORMAL HIGH (ref 11.5–15.5)
WBC Count: 4 10*3/uL (ref 4.0–10.5)
nRBC: 0 % (ref 0.0–0.2)

## 2019-03-17 LAB — CMP (CANCER CENTER ONLY)
ALT: 15 U/L (ref 0–44)
AST: 21 U/L (ref 15–41)
Albumin: 3.6 g/dL (ref 3.5–5.0)
Alkaline Phosphatase: 83 U/L (ref 38–126)
Anion gap: 7 (ref 5–15)
BUN: 9 mg/dL (ref 6–20)
CO2: 27 mmol/L (ref 22–32)
Calcium: 8.9 mg/dL (ref 8.9–10.3)
Chloride: 104 mmol/L (ref 98–111)
Creatinine: 0.67 mg/dL (ref 0.44–1.00)
GFR, Est AFR Am: >60 mL/min (ref >60)
GFR, Estimated: >60 mL/min (ref >60)
Glucose, Bld: 105 mg/dL — ABNORMAL HIGH (ref 70–99)
Potassium: 3.9 mmol/L (ref 3.5–5.1)
Sodium: 138 mmol/L (ref 135–145)
Total Bilirubin: 0.4 mg/dL (ref 0.3–1.2)
Total Protein: 6.5 g/dL (ref 6.5–8.1)

## 2019-03-17 MED ORDER — SODIUM CHLORIDE 0.9% FLUSH
10.0000 mL | INTRAVENOUS | Status: DC | PRN
  Administered 2019-03-17: 12:00:00 10 mL via INTRAVENOUS
  Filled 2019-03-17: qty 10

## 2019-03-17 NOTE — Assessment & Plan Note (Signed)
12/07/2018:Patient palpated a lump in the left breast. Mammogram showed a 1.4cm mass at the 5 o'clock position in the left breast, a left axillary lymph node measuring 2.8cm, and 2 smaller axillary lymph nodes with cortical thickening. 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 Neoadjuvant chemotherapy with Adriamycin and Cytoxan dose dense 4 followed byTaxolweekly 12with carboplatin every 3 weeks 2. Followed by breast conserving surgery withaxillary lymph nodedissection(6 lymph nodes were found on MRI) 3. Followed by adjuvant radiation therapy  Breast MRI on 12/21/18 showed the known 1.5cm mass in the left breast, at least 6 abnormal left axillary lymph nodes, and no right breast malignancy -------------------------------------------------------------------------------------------------------------------------------------------------- Current treatment:Completed 4 cycles ofdose dense Adriamycin and Cytoxan, today is cycle 3 Taxol Echocardiogram: 12/21/2018: EF 60 to 65% Labs reviewed  Chemo toxicities: 1.Fatigue:  Much better 2.Severe maculopapular rash on the chest:  Resolved 3.Myalgias:Stable 4.Severe vaginal dryness: Stable 5.Dizziness:  No further issues 6.Leukopenia: Today's ANC is . 7. Thrombocytopenia: Platelets  .   Return to clinic weekly for labs and chemotherapy

## 2019-03-17 NOTE — Progress Notes (Signed)
Patient stated that she felt like there was an area under the skin that felt like it was poking her. Felt the top right side of port and could feel the area that she was talking about. Went and got Audiological scientist to check port as well. Heather Hebert stated that it felt like a part of the port and was not concerning. Patient stated that the area did not hurt but she could feel it every now and then with movement. Dr made aware.

## 2019-03-20 ENCOUNTER — Telehealth: Payer: Self-pay | Admitting: Hematology and Oncology

## 2019-03-20 ENCOUNTER — Encounter: Payer: Self-pay | Admitting: *Deleted

## 2019-03-20 NOTE — Telephone Encounter (Signed)
I talk with patient regarding schedule as far 2/12 MD is aware the np is not available

## 2019-03-23 ENCOUNTER — Inpatient Hospital Stay: Payer: Commercial Managed Care - PPO

## 2019-03-23 ENCOUNTER — Encounter: Payer: Self-pay | Admitting: Adult Health

## 2019-03-23 ENCOUNTER — Encounter: Payer: Self-pay | Admitting: Oncology

## 2019-03-23 ENCOUNTER — Other Ambulatory Visit: Payer: Self-pay

## 2019-03-23 ENCOUNTER — Inpatient Hospital Stay (HOSPITAL_BASED_OUTPATIENT_CLINIC_OR_DEPARTMENT_OTHER): Payer: Commercial Managed Care - PPO | Admitting: Adult Health

## 2019-03-23 VITALS — BP 127/78 | HR 92 | Temp 97.8°F | Resp 18 | Ht 62.0 in | Wt 127.5 lb

## 2019-03-23 DIAGNOSIS — Z171 Estrogen receptor negative status [ER-]: Secondary | ICD-10-CM | POA: Diagnosis not present

## 2019-03-23 DIAGNOSIS — Z95828 Presence of other vascular implants and grafts: Secondary | ICD-10-CM | POA: Insufficient documentation

## 2019-03-23 DIAGNOSIS — C50512 Malignant neoplasm of lower-outer quadrant of left female breast: Secondary | ICD-10-CM

## 2019-03-23 LAB — CBC WITH DIFFERENTIAL (CANCER CENTER ONLY)
Abs Immature Granulocytes: 0.01 10*3/uL (ref 0.00–0.07)
Basophils Absolute: 0 10*3/uL (ref 0.0–0.1)
Basophils Relative: 1 %
Eosinophils Absolute: 0.1 10*3/uL (ref 0.0–0.5)
Eosinophils Relative: 4 %
HCT: 29.7 % — ABNORMAL LOW (ref 36.0–46.0)
Hemoglobin: 9.7 g/dL — ABNORMAL LOW (ref 12.0–15.0)
Immature Granulocytes: 0 %
Lymphocytes Relative: 25 %
Lymphs Abs: 0.7 10*3/uL (ref 0.7–4.0)
MCH: 32.7 pg (ref 26.0–34.0)
MCHC: 32.7 g/dL (ref 30.0–36.0)
MCV: 100 fL (ref 80.0–100.0)
Monocytes Absolute: 0.4 10*3/uL (ref 0.1–1.0)
Monocytes Relative: 15 %
Neutro Abs: 1.6 10*3/uL — ABNORMAL LOW (ref 1.7–7.7)
Neutrophils Relative %: 55 %
Platelet Count: 108 10*3/uL — ABNORMAL LOW (ref 150–400)
RBC: 2.97 MIL/uL — ABNORMAL LOW (ref 3.87–5.11)
RDW: 20.1 % — ABNORMAL HIGH (ref 11.5–15.5)
WBC Count: 2.8 10*3/uL — ABNORMAL LOW (ref 4.0–10.5)
nRBC: 0 % (ref 0.0–0.2)

## 2019-03-23 LAB — CMP (CANCER CENTER ONLY)
ALT: 15 U/L (ref 0–44)
AST: 20 U/L (ref 15–41)
Albumin: 3.7 g/dL (ref 3.5–5.0)
Alkaline Phosphatase: 90 U/L (ref 38–126)
Anion gap: 8 (ref 5–15)
BUN: 9 mg/dL (ref 6–20)
CO2: 27 mmol/L (ref 22–32)
Calcium: 9.3 mg/dL (ref 8.9–10.3)
Chloride: 106 mmol/L (ref 98–111)
Creatinine: 0.63 mg/dL (ref 0.44–1.00)
GFR, Est AFR Am: >60 mL/min (ref >60)
GFR, Estimated: >60 mL/min (ref >60)
Glucose, Bld: 88 mg/dL (ref 70–99)
Potassium: 4 mmol/L (ref 3.5–5.1)
Sodium: 141 mmol/L (ref 135–145)
Total Bilirubin: 0.2 mg/dL — ABNORMAL LOW (ref 0.3–1.2)
Total Protein: 6.7 g/dL (ref 6.5–8.1)

## 2019-03-23 MED ORDER — SODIUM CHLORIDE 0.9% FLUSH
10.0000 mL | INTRAVENOUS | Status: DC | PRN
  Administered 2019-03-23: 10 mL
  Filled 2019-03-23: qty 10

## 2019-03-23 MED ORDER — HEPARIN SOD (PORK) LOCK FLUSH 100 UNIT/ML IV SOLN
500.0000 [IU] | Freq: Once | INTRAVENOUS | Status: AC | PRN
  Administered 2019-03-23: 12:00:00 500 [IU]
  Filled 2019-03-23: qty 5

## 2019-03-23 NOTE — Assessment & Plan Note (Addendum)
12/07/2018:Patient palpated a lump in the left breast. Mammogram showed a 1.4cm mass at the 5 o'clock position in the left breast, a left axillary lymph node measuring 2.8cm, and 2 smaller axillary lymph nodes with cortical thickening. 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 Neoadjuvant chemotherapy with Adriamycin and Cytoxan dose dense 4 followed byTaxolweekly 12with carboplatin every 3 weeks--carbo eliminated due to cytopenias 2. Followed by breast conserving surgery withaxillary lymph nodedissection(6 lymph nodes were found on MRI) 3. Followed by adjuvant radiation therapy  Breast MRI on 12/21/18 showed the known 1.5cm mass in the left breast, at least 6 abnormal left axillary lymph nodes, and no right breast malignancy -------------------------------------------------------------------------------------------------------------------------------------------------- Current treatment:Completed 4 cycles ofdose dense Adriamycin and Cytoxan,dur for cycle 3 Taxol Echocardiogram: 12/21/2018: EF 60 to 65%  Sanda's labs are improved today.  She had previously not received treatment since 1/15 due to increased cytopenias.  Her platelet count and ANC today are sufficient to resume treatment.  Per Dr. Geralyn Flash last note, she should receive Taxol at 34m/m2 and will no longer receive Carboplatin.  I have adjusted the orders and reviewed the above with Dr. MJana Hakimin detail who signed the orders.  SJacobiis delighted to hear the news.  She will return next week for labs, f/u with Dr. GLindi Adieand her fourth week of neoadjuvant chemotherapy.

## 2019-03-23 NOTE — Progress Notes (Signed)
[facility] Follow up:    [name], [place] [address]   DIAGNOSIS: Cancer Staging Malignant neoplasm of lower-outer quadrant of left breast of female, estrogen receptor negative ([place]) Staging form: Breast, AJCC 8th Edition - Clinical: Stage IIB (cT1c, cN1, cM0, G3, ER-, PR-, HER2-) - Signed by Nicholas Lose, MD on 12/14/2018   SUMMARY OF ONCOLOGIC HISTORY: Oncology History  Malignant neoplasm of lower-outer quadrant of left breast of female, estrogen receptor negative ([place])  12/07/2018 Initial Diagnosis   Patient palpated a lump in the left breast. Mammogram showed a 1.4cm mass at the 5 o'clock position in the left breast, a left axillary lymph node measuring 2.8cm, and 2 smaller axillary lymph nodes with cortical thickening. 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   12/14/2018 Cancer Staging   Staging form: Breast, AJCC 8th Edition - Clinical: Stage IIB (cT1c, cN1, cM0, G3, ER-, PR-, HER2-) - Signed by Nicholas Lose, MD on 12/14/2018   12/23/2018 -  Chemotherapy   The patient had DOXOrubicin (ADRIAMYCIN) chemo injection 98 mg, 60 mg/m2 = 98 mg, Intravenous,  Once, 4 of 4 cycles Dose modification: 50 mg/m2 (original dose 60 mg/m2, Cycle 2, Reason: Dose not tolerated) Administration: 98 mg (12/23/2018), 82 mg (01/06/2019), 82 mg (01/20/2019), 82 mg (02/02/2019) palonosetron (ALOXI) injection 0.25 mg, 0.25 mg, Intravenous,  Once, 5 of 8 cycles Administration: 0.25 mg (12/23/2018), 0.25 mg (02/17/2019), 0.25 mg (01/06/2019), 0.25 mg (01/20/2019), 0.25 mg (02/02/2019) pegfilgrastim-jmdb (FULPHILA) injection 6 mg, 6 mg, Subcutaneous,  Once, 4 of 4 cycles Administration: 6 mg (12/26/2018), 6 mg (01/09/2019), 6 mg (01/23/2019), 6 mg (02/04/2019) CARBOplatin (PARAPLATIN) 570 mg in sodium chloride 0.9 % 250 mL chemo infusion, 570 mg (105.4 % of original dose 544.2 mg), Intravenous,  Once, 1  of 1 cycle Dose modification:   (original dose 544.2 mg, Cycle 5) Administration: 570 mg (02/17/2019) cyclophosphamide (CYTOXAN) 980 mg in sodium chloride 0.9 % 250 mL chemo infusion, 600 mg/m2 = 980 mg, Intravenous,  Once, 4 of 4 cycles Dose modification: 500 mg/m2 (original dose 600 mg/m2, Cycle 2, Reason: Dose not tolerated) Administration: 980 mg (12/23/2018), 820 mg (01/06/2019), 820 mg (01/20/2019), 820 mg (02/02/2019) PACLitaxel (TAXOL) 132 mg in sodium chloride 0.9 % 250 mL chemo infusion (</= 70m/m2), 80 mg/m2 = 132 mg, Intravenous,  Once, 1 of 4 cycles Dose modification: 65 mg/m2 (original dose 80 mg/m2, Cycle 5, Reason: Provider Judgment), 50 mg/m2 (original dose 80 mg/m2, Cycle 6, Reason: Provider Judgment) Administration: 132 mg (02/17/2019), 108 mg (02/24/2019) fosaprepitant (EMEND) 150 mg, dexamethasone (DECADRON) 12 mg in sodium chloride 0.9 % 145 mL IVPB, , Intravenous,  Once, 5 of 8 cycles Administration:  (12/23/2018),  (02/17/2019),  (01/06/2019),  (01/20/2019),  (02/02/2019)  for chemotherapy treatment.      CURRENT THERAPY:  Taxol weekly  INTERVAL HISTORY: Heather Zachow89y.o. female returns for evaluation prior to receiving weekly neoadjuvant taxol.  She has not received treatment since 02/24/2019 due to thrombocytopenia.  Her platelet count today is 108.  She can no longer feel her tumor.  She is feeling well today.     Patient Active Problem List   Diagnosis Date Noted  . Port-A-Cath in place 03/23/2019  . Genetic testing 12/15/2018  . Family history of breast cancer   . Malignant neoplasm of lower-outer quadrant of left breast of female, estrogen receptor negative ([place] 12/07/2018  . Acute  appendicitis with localized peritonitis 08/05/2015    has No Known Allergies.  MEDICAL HISTORY: Past Medical History:  Diagnosis Date  . Cancer ([place]) 11/2018   left breast ICD  . Family history of breast cancer     SURGICAL HISTORY: Past Surgical History:  Procedure  Laterality Date  . CESAREAN SECTION    . LAPAROSCOPIC APPENDECTOMY N/A 08/05/2015   Procedure: APPENDECTOMY LAPAROSCOPIC;  Surgeon: Mickeal Skinner, MD;  Location: [facility];  Service: General;  Laterality: N/A;  . PORTACATH PLACEMENT Left 12/22/2018   Procedure: INSERTION PORT-A-CATH;  Surgeon: [name], MD;  Location: [facility];  Service: General;  Laterality: Left;    SOCIAL HISTORY: Social History   Socioeconomic History  . Marital status: Married    Spouse name: Not on file  . Number of children: Not on file  . Years of education: Not on file  . Highest education level: Not on file  Occupational History  . Not on file  Tobacco Use  . Smoking status: Former Research scientist (life sciences)  . Smokeless tobacco: Never Used  Substance and Sexual Activity  . Alcohol use: Yes    Comment: "once a month"  . Drug use: No  . Sexual activity: Not on file  Other Topics Concern  . Not on file  Social History Narrative  . Not on file   Social Determinants of Health   Financial Resource Strain:   . Difficulty of Paying Living Expenses: Not on file  Food Insecurity:   . Worried About Charity fundraiser in the Last Year: Not on file  . Ran Out of Food in the Last Year: Not on file  Transportation Needs:   . Lack of Transportation (Medical): Not on file  . Lack of Transportation (Non-Medical): Not on file  Physical Activity:   . Days of Exercise per Week: Not on file  . Minutes of Exercise per Session: Not on file  Stress:   . Feeling of Stress : Not on file  Social Connections:   . Frequency of Communication with Friends and Family: Not on file  . Frequency of Social Gatherings with Friends and Family: Not on file  . Attends Religious Services: Not on file  . Active Member of Clubs or Organizations: Not on file  . Attends Archivist Meetings: Not on file  . Marital Status: Not on file  Intimate Partner Violence:   . Fear of Current or Ex-Partner: Not on file  .  Emotionally Abused: Not on file  . Physically Abused: Not on file  . Sexually Abused: Not on file    FAMILY HISTORY: Family History  Problem Relation Age of Onset  . Breast cancer Cousin        first cousin once removed (father's cousin)    Review of Systems  Constitutional: Positive for fatigue. Negative for appetite change, chills, fever and unexpected weight change.  HENT:   Negative for hearing loss and lump/mass.   Eyes: Negative for eye problems and icterus.  Respiratory: Negative for chest tightness, cough and shortness of breath.   Cardiovascular: Negative for chest pain, leg swelling and palpitations.  Gastrointestinal: Negative for abdominal distention, abdominal pain, constipation, diarrhea, nausea and vomiting.  Endocrine: Negative for hot flashes.  Genitourinary: Negative for difficulty urinating.   Musculoskeletal: Negative for arthralgias.  Skin: Negative for itching and rash.  Neurological: Negative for dizziness, extremity weakness, headaches and numbness.  Hematological: Negative for adenopathy. Does not bruise/bleed easily.  Psychiatric/Behavioral: Negative for depression.  The patient is not nervous/anxious.       PHYSICAL EXAMINATION  ECOG PERFORMANCE STATUS: 1 - Symptomatic but completely ambulatory  Vitals:   03/23/19 1201  BP: 127/78  Pulse: 92  Resp: 18  Temp: 97.8 F (36.6 C)  SpO2: 100%    Physical Exam Constitutional:      General: She is not in acute distress.    Appearance: Normal appearance. She is not toxic-appearing.  HENT:     Head: Normocephalic and atraumatic.  Eyes:     General: No scleral icterus. Cardiovascular:     Rate and Rhythm: Normal rate and regular rhythm.     Pulses: Normal pulses.     Heart sounds: Normal heart sounds.  Pulmonary:     Effort: Pulmonary effort is normal.     Breath sounds: Normal breath sounds.  Abdominal:     General: Abdomen is flat. Bowel sounds are normal. There is no distension.      Palpations: Abdomen is soft. There is no mass.     Tenderness: There is no abdominal tenderness.  Musculoskeletal:     Cervical back: Neck supple.  Lymphadenopathy:     Cervical: No cervical adenopathy.  Skin:    General: Skin is warm and dry.     Capillary Refill: Capillary refill takes less than 2 seconds.     Findings: No rash.  Neurological:     General: No focal deficit present.     Mental Status: She is alert.  Psychiatric:        Mood and Affect: Mood normal.        Behavior: Behavior normal.     LABORATORY DATA:  CBC    Component Value Date/Time   WBC 2.8 (L) 03/23/2019 1155   WBC 9.4 08/06/2015 0520   RBC 2.97 (L) 03/23/2019 1155   HGB 9.7 (L) 03/23/2019 1155   HCT 29.7 (L) 03/23/2019 1155   PLT 108 (L) 03/23/2019 1155   MCV 100.0 03/23/2019 1155   MCH 32.7 03/23/2019 1155   MCHC 32.7 03/23/2019 1155   RDW 20.1 (H) 03/23/2019 1155   LYMPHSABS 0.7 03/23/2019 1155   MONOABS 0.4 03/23/2019 1155   EOSABS 0.1 03/23/2019 1155   BASOSABS 0.0 03/23/2019 1155    CMP     Component Value Date/Time   NA 141 03/23/2019 1155   K 4.0 03/23/2019 1155   CL 106 03/23/2019 1155   CO2 27 03/23/2019 1155   GLUCOSE 88 03/23/2019 1155   BUN 9 03/23/2019 1155   CREATININE 0.63 03/23/2019 1155   CALCIUM 9.3 03/23/2019 1155   PROT 6.7 03/23/2019 1155   ALBUMIN 3.7 03/23/2019 1155   AST 20 03/23/2019 1155   ALT 15 03/23/2019 1155   ALKPHOS 90 03/23/2019 1155   BILITOT 0.2 (L) 03/23/2019 1155   GFRNONAA >60 03/23/2019 1155   GFRAA >60 03/23/2019 1155      ASSESSMENT and PLAN:   Malignant neoplasm of lower-outer quadrant of left breast of female, estrogen receptor negative ([place]) 12/07/2018:Patient palpated a lump in the left breast. Mammogram showed a 1.4cm mass at the 5 o'clock position in the left breast, a left axillary lymph node measuring 2.8cm, and 2 smaller axillary lymph nodes with cortical thickening. Biopsy showed IDC, grade 3, HER-2 - (0), ER/PR -, Ki67 80% in  the breast and lymph node. Stage IIb  Treatment plan: 1. Neoadjuvant chemotherapy with Adriamycin and Cytoxan dose dense 4 followed byTaxolweekly 12with carboplatin every 3 weeks--carbo eliminated due to cytopenias 2.  Followed by breast conserving surgery withaxillary lymph nodedissection(6 lymph nodes were found on MRI) 3. Followed by adjuvant radiation therapy  Breast MRI on 12/21/18 showed the known 1.5cm mass in the left breast, at least 6 abnormal left axillary lymph nodes, and no right breast malignancy -------------------------------------------------------------------------------------------------------------------------------------------------- Current treatment:Completed 4 cycles ofdose dense Adriamycin and Cytoxan,dur for cycle 3 Taxol Echocardiogram: 12/21/2018: EF 60 to 65%  Meztli's labs are improved today.  She had previously not received treatment since 1/15 due to increased cytopenias.  Her platelet count and ANC today are sufficient to resume treatment.  Per Dr. Geralyn Flash last note, she should receive Taxol at 64m/m2 and will no longer receive Carboplatin.  I have adjusted the orders and reviewed the above with Dr. MJana Hakimin detail who signed the orders.  SKelliannis delighted to hear the news.  She will return next week for labs, f/u with Dr. GLindi Adieand her fourth week of neoadjuvant chemotherapy.     All questions were answered. The patient knows to call the clinic with any problems, questions or concerns. We can certainly see the patient much sooner if necessary.  Total encounter time: 30 minutes*  This note was electronically signed. LWilber Bihari NP 03/24/19 11:32 AM Medical Oncology [facility] [place] [id number]. [phone]   Fax. [phone] *Total Encounter Time as defined by the Centers for Medicare and Medicaid Services includes, in addition to the face-to-face time of a patient visit  (documented in the note above) non-face-to-face time: obtaining and reviewing outside history, ordering and reviewing medications, tests or procedures, care coordination (communications with other health care professionals or caregivers) and documentation in the medical record.

## 2019-03-24 ENCOUNTER — Inpatient Hospital Stay: Payer: Commercial Managed Care - PPO

## 2019-03-24 ENCOUNTER — Other Ambulatory Visit: Payer: Commercial Managed Care - PPO

## 2019-03-24 ENCOUNTER — Other Ambulatory Visit: Payer: Self-pay

## 2019-03-24 VITALS — BP 127/75 | HR 85 | Temp 98.3°F | Resp 16

## 2019-03-24 DIAGNOSIS — Z171 Estrogen receptor negative status [ER-]: Secondary | ICD-10-CM

## 2019-03-24 DIAGNOSIS — C50512 Malignant neoplasm of lower-outer quadrant of left female breast: Secondary | ICD-10-CM

## 2019-03-24 MED ORDER — HEPARIN SOD (PORK) LOCK FLUSH 100 UNIT/ML IV SOLN
500.0000 [IU] | Freq: Once | INTRAVENOUS | Status: AC | PRN
  Administered 2019-03-24: 500 [IU]
  Filled 2019-03-24: qty 5

## 2019-03-24 MED ORDER — DIPHENHYDRAMINE HCL 50 MG/ML IJ SOLN
25.0000 mg | Freq: Once | INTRAMUSCULAR | Status: AC
  Administered 2019-03-24: 25 mg via INTRAVENOUS

## 2019-03-24 MED ORDER — SODIUM CHLORIDE 0.9 % IV SOLN
20.0000 mg | Freq: Once | INTRAVENOUS | Status: AC
  Administered 2019-03-24: 14:00:00 20 mg via INTRAVENOUS
  Filled 2019-03-24: qty 20

## 2019-03-24 MED ORDER — DIPHENHYDRAMINE HCL 50 MG/ML IJ SOLN
INTRAMUSCULAR | Status: AC
  Filled 2019-03-24: qty 1

## 2019-03-24 MED ORDER — FAMOTIDINE IN NACL 20-0.9 MG/50ML-% IV SOLN
20.0000 mg | Freq: Once | INTRAVENOUS | Status: AC
  Administered 2019-03-24: 20 mg via INTRAVENOUS

## 2019-03-24 MED ORDER — SODIUM CHLORIDE 0.9 % IV SOLN
50.0000 mg/m2 | Freq: Once | INTRAVENOUS | Status: AC
  Administered 2019-03-24: 84 mg via INTRAVENOUS
  Filled 2019-03-24: qty 14

## 2019-03-24 MED ORDER — SODIUM CHLORIDE 0.9% FLUSH
10.0000 mL | INTRAVENOUS | Status: DC | PRN
  Administered 2019-03-24: 10 mL
  Filled 2019-03-24: qty 10

## 2019-03-24 MED ORDER — SODIUM CHLORIDE 0.9 % IV SOLN
Freq: Once | INTRAVENOUS | Status: AC
  Filled 2019-03-24: qty 250

## 2019-03-24 MED ORDER — FAMOTIDINE IN NACL 20-0.9 MG/50ML-% IV SOLN
INTRAVENOUS | Status: AC
  Filled 2019-03-24: qty 50

## 2019-03-24 NOTE — Patient Instructions (Signed)
[facility] Discharge Instructions for Patients Receiving Chemotherapy  Today you received the following chemotherapy agents: paclitaxel.  To help prevent nausea and vomiting after your treatment, we encourage you to take your nausea medication as directed.   If you develop nausea and vomiting that is not controlled by your nausea medication, call the clinic.   BELOW ARE SYMPTOMS THAT SHOULD BE REPORTED IMMEDIATELY:  *FEVER GREATER THAN 100.5 F  *CHILLS WITH OR WITHOUT FEVER  NAUSEA AND VOMITING THAT IS NOT CONTROLLED WITH YOUR NAUSEA MEDICATION  *UNUSUAL SHORTNESS OF BREATH  *UNUSUAL BRUISING OR BLEEDING  TENDERNESS IN MOUTH AND THROAT WITH OR WITHOUT PRESENCE OF ULCERS  *URINARY PROBLEMS  *BOWEL PROBLEMS  UNUSUAL RASH Items with * indicate a potential emergency and should be followed up as soon as possible.  Feel free to call the clinic should you have any questions or concerns. The clinic phone number is [phone].  Please show the CHEMO ALERT CARD at check-in to the Emergency Department and triage nurse.   

## 2019-03-30 ENCOUNTER — Encounter: Payer: Self-pay | Admitting: *Deleted

## 2019-03-30 NOTE — Progress Notes (Signed)
Patient Care Team: [name], FNP as PCP - General (Family Medicine) Mauro Kaufmann, RN as Oncology Nurse Navigator Rockwell Germany, RN as Oncology Nurse Navigator  DIAGNOSIS:    ICD-10-CM   1. Malignant neoplasm of lower-outer quadrant of left breast of female, estrogen receptor negative ([place])  C50.512    Z17.1     SUMMARY OF ONCOLOGIC HISTORY: Oncology History  Malignant neoplasm of lower-outer quadrant of left breast of female, estrogen receptor negative ([place])  12/07/2018 Initial Diagnosis   Patient palpated a lump in the left breast. Mammogram showed a 1.4cm mass at the 5 o'clock position in the left breast, a left axillary lymph node measuring 2.8cm, and 2 smaller axillary lymph nodes with cortical thickening. 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   12/14/2018 Cancer Staging   Staging form: Breast, AJCC 8th Edition - Clinical: Stage IIB (cT1c, cN1, cM0, G3, ER-, PR-, HER2-) - Signed by Nicholas Lose, MD on 12/14/2018   12/23/2018 -  Chemotherapy   The patient had DOXOrubicin (ADRIAMYCIN) chemo injection 98 mg, 60 mg/m2 = 98 mg, Intravenous,  Once, 4 of 4 cycles Dose modification: 50 mg/m2 (original dose 60 mg/m2, Cycle 2, Reason: Dose not tolerated) Administration: 98 mg (12/23/2018), 82 mg (01/06/2019), 82 mg (01/20/2019), 82 mg (02/02/2019) palonosetron (ALOXI) injection 0.25 mg, 0.25 mg, Intravenous,  Once, 5 of 8 cycles Administration: 0.25 mg (12/23/2018), 0.25 mg (02/17/2019), 0.25 mg (01/06/2019), 0.25 mg (01/20/2019), 0.25 mg (02/02/2019) pegfilgrastim-jmdb (FULPHILA) injection 6 mg, 6 mg, Subcutaneous,  Once, 4 of 4 cycles Administration: 6 mg (12/26/2018), 6 mg (01/09/2019), 6 mg (01/23/2019), 6 mg (02/04/2019) CARBOplatin (PARAPLATIN) 570 mg in sodium chloride 0.9 % 250 mL chemo infusion, 570 mg (105.4 % of original dose 544.2 mg), Intravenous,  Once, 1 of 1 cycle Dose modification:   (original dose 544.2 mg,  Cycle 5) Administration: 570 mg (02/17/2019) cyclophosphamide (CYTOXAN) 980 mg in sodium chloride 0.9 % 250 mL chemo infusion, 600 mg/m2 = 980 mg, Intravenous,  Once, 4 of 4 cycles Dose modification: 500 mg/m2 (original dose 600 mg/m2, Cycle 2, Reason: Dose not tolerated) Administration: 980 mg (12/23/2018), 820 mg (01/06/2019), 820 mg (01/20/2019), 820 mg (02/02/2019) PACLitaxel (TAXOL) 132 mg in sodium chloride 0.9 % 250 mL chemo infusion (</= 71m/m2), 80 mg/m2 = 132 mg, Intravenous,  Once, 1 of 4 cycles Dose modification: 65 mg/m2 (original dose 80 mg/m2, Cycle 5, Reason: Provider Judgment), 50 mg/m2 (original dose 80 mg/m2, Cycle 6, Reason: Provider Judgment) Administration: 132 mg (02/17/2019), 108 mg (02/24/2019), 84 mg (03/24/2019) fosaprepitant (EMEND) 150 mg, dexamethasone (DECADRON) 12 mg in sodium chloride 0.9 % 145 mL IVPB, , Intravenous,  Once, 5 of 8 cycles Administration:  (12/23/2018),  (02/17/2019),  (01/06/2019),  (01/20/2019),  (02/02/2019)  for chemotherapy treatment.      CHIEF COMPLIANT: Cycle4 Taxol  INTERVAL HISTORY: [name] a 59y.o. 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 Heather Hebert is currently receiving neoadjuvant chemotherapywith weekly Taxol after completing 4 cycles ofdose dense Adriamycin and Cytoxan.Heather Hebert presents to the clinic todayforcycle4.  We were able to resume her Taxol last week.  Heather Hebert tolerated it extremely well.  Does not have any new complaints or concerns.  Heather Hebert is anxious about her blood work today.  ALLERGIES:  has No Known Allergies.  MEDICATIONS:  Current Outpatient Medications  Medication Sig Dispense Refill  . Calcium Carb-Cholecalciferol (CALCIUM 600 + D PO) Take 1 tablet  by mouth 2 (two) times daily.    . cetirizine (ZYRTEC) 10 MG tablet Take 10 mg by mouth daily.    . Flax OIL Take by mouth. One cap per day    . fluocinolone (SYNALAR) 0.025 % cream Apply topically 3 (three) times daily. 120 g 1  .  lidocaine-prilocaine (EMLA) cream Apply to affected area once 30 g 3  . LORazepam (ATIVAN) 0.5 MG tablet Take 1 tablet (0.5 mg total) by mouth at bedtime as needed for sleep. 30 tablet 0  . methylPREDNISolone (MEDROL DOSEPAK) 4 MG TBPK tablet Use as directed 21 tablet 0  . Multiple Vitamin (MULTIVITAMIN WITH MINERALS) TABS tablet Take 1 tablet by mouth daily.    . Olopatadine HCl (PATADAY OP) Place 1 drop into both eyes daily as needed. For dry eyes    . ondansetron (ZOFRAN) 8 MG tablet Take 1 tablet (8 mg total) by mouth 2 (two) times daily as needed. Start on the third day after chemotherapy. 30 tablet 1  . predniSONE (DELTASONE) 5 MG tablet 6 tab x 1 day, 5 tab x 1 day, 4 tab x 1 day, 3 tab x 1 day, 2 tab x 1 day, 1 tab x 1 day, stop 21 tablet 0  . Probiotic Product (PROBIOTIC PO) Take 1 tablet by mouth every other day.     . prochlorperazine (COMPAZINE) 10 MG tablet Take 1 tablet (10 mg total) by mouth every 6 (six) hours as needed (Nausea or vomiting). 30 tablet 1   No current facility-administered medications for this visit.    PHYSICAL EXAMINATION: ECOG PERFORMANCE STATUS: 1 - Symptomatic but completely ambulatory  Vitals:   03/31/19 1120  BP: 131/71  Pulse: (!) 105  Resp: 18  Temp: 98 F (36.7 C)  SpO2: 100%   Filed Weights   03/31/19 1120  Weight: 129 lb 3.2 oz (58.6 kg)    LABORATORY DATA:  I have reviewed the data as listed CMP Latest Ref Rng & Units 03/31/2019 03/23/2019 03/17/2019  Glucose 70 - 99 mg/dL 107(H) 88 105(H)  BUN 6 - 20 mg/dL _0 Creatinine 0.44 - 1.00 mg/dL 0.69 0.63 0.67  Sodium 135 - 145 mmol/L 140 141 138  Potassium 3.5 - 5.1 mmol/L 3.6 4.0 3.9  Chloride 98 - 111 mmol/L 104 106 104  CO2 22 - 32 mmol/L _1 Calcium 8.9 - 10.3 mg/dL 9.1 9.3 8.9  Total Protein 6.5 - 8.1 g/dL 6.6 6.7 6.5  Total Bilirubin 0.3 - 1.2 mg/dL 0.3 0.2(L) 0.4  Alkaline Phos 38 - 126 U/L 90 90 83  AST 15 - 41 U/L 34 20 21  ALT 0 - 44 U/L 34 15 15    Lab Results    Component Value Date   WBC 2.5 (L) 03/31/2019   HGB 10.1 (L) 03/31/2019   HCT 30.6 (L) 03/31/2019   MCV 102.7 (H) 03/31/2019   PLT 179 03/31/2019   NEUTROABS 1.4 (L) 03/31/2019    ASSESSMENT & PLAN:  Malignant neoplasm of lower-outer quadrant of left breast of female, estrogen receptor negative ([place]) 12/07/2018:Patient palpated a lump in the left breast. Mammogram showed a 1.4cm mass at the 5 o'clock position in the left breast, a left axillary lymph node measuring 2.8cm, and 2 smaller axillary lymph nodes with cortical thickening. 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 Neoadjuvant chemotherapy with Adriamycin and Cytoxan dose  dense 4 followed byTaxolweekly 12with carboplatin every 3 weeks--carbo eliminated due to cytopenias 2. Followed by breast conserving surgery withaxillary lymph nodedissection(6 lymph nodes were found on MRI) 3. Followed by adjuvant radiation therapy  Breast MRI on 12/21/18 showed the known 1.5cm mass in the left breast, at least 6 abnormal left axillary lymph nodes, and no right breast malignancy -------------------------------------------------------------------------------------------------------------------------------------------------- Current treatment:Completed 4 cycles ofdose dense Adriamycin and Cytoxan, today is cycle 4 Taxol Echocardiogram: 12/21/2018: EF 60 to 65%  Chemo toxicities: 1.  Severe cytopenias: Carboplatin discontinued.  Primarily due to thrombocytopenia.  Taxol dose reduced to 50 mg meter square.  Monitoring closely. No clinical signs symptoms of neuropathy. 2.  Leukopenia/neutropenia: We are watching this very closely.  We are okay to treat as long as ANC is greater than 1.2. 3.  Chemotherapy-induced anemia: Slowly improving.  We will keep the dosage of Taxol the same. Return to clinic weekly for Taxol every other week for follow-up with  me.    No orders of the defined types were placed in this encounter.  The patient has a good understanding of the overall plan. Heather Hebert agrees with it. Heather Hebert will call with any problems that may develop before the next visit here.  Total time spent: 30 mins including face to face time and time spent for planning, charting and coordination of care  Nicholas Lose, MD 03/31/2019  I, Cloyde Reams Dorshimer, am acting as scribe for Dr. Nicholas Lose.  I have reviewed the above documentation for accuracy and completeness, and I agree with the above.

## 2019-03-31 ENCOUNTER — Inpatient Hospital Stay: Payer: Commercial Managed Care - PPO

## 2019-03-31 ENCOUNTER — Inpatient Hospital Stay (HOSPITAL_BASED_OUTPATIENT_CLINIC_OR_DEPARTMENT_OTHER): Payer: Commercial Managed Care - PPO | Admitting: Hematology and Oncology

## 2019-03-31 ENCOUNTER — Other Ambulatory Visit: Payer: Self-pay

## 2019-03-31 VITALS — HR 92

## 2019-03-31 DIAGNOSIS — C50512 Malignant neoplasm of lower-outer quadrant of left female breast: Secondary | ICD-10-CM

## 2019-03-31 DIAGNOSIS — Z171 Estrogen receptor negative status [ER-]: Secondary | ICD-10-CM | POA: Diagnosis not present

## 2019-03-31 DIAGNOSIS — Z95828 Presence of other vascular implants and grafts: Secondary | ICD-10-CM

## 2019-03-31 LAB — CBC WITH DIFFERENTIAL (CANCER CENTER ONLY)
Abs Immature Granulocytes: 0.03 10*3/uL (ref 0.00–0.07)
Basophils Absolute: 0 10*3/uL (ref 0.0–0.1)
Basophils Relative: 1 %
Eosinophils Absolute: 0.1 10*3/uL (ref 0.0–0.5)
Eosinophils Relative: 4 %
HCT: 30.6 % — ABNORMAL LOW (ref 36.0–46.0)
Hemoglobin: 10.1 g/dL — ABNORMAL LOW (ref 12.0–15.0)
Immature Granulocytes: 1 %
Lymphocytes Relative: 21 %
Lymphs Abs: 0.5 10*3/uL — ABNORMAL LOW (ref 0.7–4.0)
MCH: 33.9 pg (ref 26.0–34.0)
MCHC: 33 g/dL (ref 30.0–36.0)
MCV: 102.7 fL — ABNORMAL HIGH (ref 80.0–100.0)
Monocytes Absolute: 0.5 10*3/uL (ref 0.1–1.0)
Monocytes Relative: 19 %
Neutro Abs: 1.4 10*3/uL — ABNORMAL LOW (ref 1.7–7.7)
Neutrophils Relative %: 54 %
Platelet Count: 179 10*3/uL (ref 150–400)
RBC: 2.98 MIL/uL — ABNORMAL LOW (ref 3.87–5.11)
RDW: 19.3 % — ABNORMAL HIGH (ref 11.5–15.5)
WBC Count: 2.5 10*3/uL — ABNORMAL LOW (ref 4.0–10.5)
nRBC: 0 % (ref 0.0–0.2)

## 2019-03-31 LAB — CMP (CANCER CENTER ONLY)
ALT: 34 U/L (ref 0–44)
AST: 34 U/L (ref 15–41)
Albumin: 3.6 g/dL (ref 3.5–5.0)
Alkaline Phosphatase: 90 U/L (ref 38–126)
Anion gap: 9 (ref 5–15)
BUN: 9 mg/dL (ref 6–20)
CO2: 27 mmol/L (ref 22–32)
Calcium: 9.1 mg/dL (ref 8.9–10.3)
Chloride: 104 mmol/L (ref 98–111)
Creatinine: 0.69 mg/dL (ref 0.44–1.00)
GFR, Est AFR Am: >60 mL/min (ref >60)
GFR, Estimated: >60 mL/min (ref >60)
Glucose, Bld: 107 mg/dL — ABNORMAL HIGH (ref 70–99)
Potassium: 3.6 mmol/L (ref 3.5–5.1)
Sodium: 140 mmol/L (ref 135–145)
Total Bilirubin: 0.3 mg/dL (ref 0.3–1.2)
Total Protein: 6.6 g/dL (ref 6.5–8.1)

## 2019-03-31 MED ORDER — PALONOSETRON HCL INJECTION 0.25 MG/5ML
INTRAVENOUS | Status: AC
  Filled 2019-03-31: qty 5

## 2019-03-31 MED ORDER — PALONOSETRON HCL INJECTION 0.25 MG/5ML
0.2500 mg | Freq: Once | INTRAVENOUS | Status: AC
  Administered 2019-03-31: 0.25 mg via INTRAVENOUS

## 2019-03-31 MED ORDER — SODIUM CHLORIDE 0.9% FLUSH
10.0000 mL | INTRAVENOUS | Status: DC | PRN
  Administered 2019-03-31: 10 mL
  Filled 2019-03-31: qty 10

## 2019-03-31 MED ORDER — FAMOTIDINE IN NACL 20-0.9 MG/50ML-% IV SOLN
INTRAVENOUS | Status: AC
  Filled 2019-03-31: qty 50

## 2019-03-31 MED ORDER — HEPARIN SOD (PORK) LOCK FLUSH 100 UNIT/ML IV SOLN
500.0000 [IU] | Freq: Once | INTRAVENOUS | Status: AC | PRN
  Administered 2019-03-31: 500 [IU]
  Filled 2019-03-31: qty 5

## 2019-03-31 MED ORDER — SODIUM CHLORIDE 0.9 % IV SOLN
Freq: Once | INTRAVENOUS | Status: AC
  Filled 2019-03-31: qty 5

## 2019-03-31 MED ORDER — DIPHENHYDRAMINE HCL 50 MG/ML IJ SOLN
INTRAMUSCULAR | Status: AC
  Filled 2019-03-31: qty 1

## 2019-03-31 MED ORDER — SODIUM CHLORIDE 0.9 % IV SOLN
50.0000 mg/m2 | Freq: Once | INTRAVENOUS | Status: AC
  Administered 2019-03-31: 84 mg via INTRAVENOUS
  Filled 2019-03-31: qty 14

## 2019-03-31 MED ORDER — FAMOTIDINE IN NACL 20-0.9 MG/50ML-% IV SOLN
20.0000 mg | Freq: Once | INTRAVENOUS | Status: AC
  Administered 2019-03-31: 20 mg via INTRAVENOUS

## 2019-03-31 MED ORDER — SODIUM CHLORIDE 0.9 % IV SOLN
Freq: Once | INTRAVENOUS | Status: AC
  Filled 2019-03-31: qty 250

## 2019-03-31 MED ORDER — DIPHENHYDRAMINE HCL 50 MG/ML IJ SOLN
25.0000 mg | Freq: Once | INTRAMUSCULAR | Status: AC
  Administered 2019-03-31: 25 mg via INTRAVENOUS

## 2019-03-31 MED ORDER — SODIUM CHLORIDE 0.9% FLUSH
10.0000 mL | INTRAVENOUS | Status: DC | PRN
  Administered 2019-03-31: 11:00:00 10 mL
  Filled 2019-03-31: qty 10

## 2019-03-31 NOTE — Addendum Note (Signed)
Addended by: Nicholas Lose on: 03/31/2019 12:02 PM   Modules accepted: Orders

## 2019-03-31 NOTE — Patient Instructions (Signed)
Paclitaxel injection What is this medicine? PACLITAXEL (PAK li TAX el) is a chemotherapy drug. It targets fast dividing cells, like cancer cells, and causes these cells to die. This medicine is used to treat ovarian cancer, breast cancer, lung cancer, Kaposi's sarcoma, and other cancers. This medicine may be used for other purposes; ask your health care provider or pharmacist if you have questions. COMMON BRAND NAME(S): Onxol, Taxol What should I tell my health care provider before I take this medicine? They need to know if you have any of these conditions:  history of irregular heartbeat  liver disease  low blood counts, like low white cell, platelet, or red cell counts  lung or breathing disease, like asthma  tingling of the fingers or toes, or other nerve disorder  an unusual or allergic reaction to paclitaxel, alcohol, polyoxyethylated castor oil, other chemotherapy, other medicines, foods, dyes, or preservatives  pregnant or trying to get pregnant  breast-feeding How should I use this medicine? This drug is given as an infusion into a vein. It is administered in a hospital or clinic by a specially trained health care professional. Talk to your pediatrician regarding the use of this medicine in children. Special care may be needed. Overdosage: If you think you have taken too much of this medicine contact a poison control center or emergency room at once. NOTE: This medicine is only for you. Do not share this medicine with others. What if I miss a dose? It is important not to miss your dose. Call your doctor or health care professional if you are unable to keep an appointment. What may interact with this medicine? Do not take this medicine with any of the following medications:  disulfiram  metronidazole This medicine may also interact with the following medications:  antiviral medicines for hepatitis, HIV or AIDS  certain antibiotics like erythromycin and  clarithromycin  certain medicines for fungal infections like ketoconazole and itraconazole  certain medicines for seizures like carbamazepine, phenobarbital, phenytoin  gemfibrozil  nefazodone  rifampin  St. John's wort This list may not describe all possible interactions. Give your health care provider a list of all the medicines, herbs, non-prescription drugs, or dietary supplements you use. Also tell them if you smoke, drink alcohol, or use illegal drugs. Some items may interact with your medicine. What should I watch for while using this medicine? Your condition will be monitored carefully while you are receiving this medicine. You will need important blood work done while you are taking this medicine. This medicine can cause serious allergic reactions. To reduce your risk you will need to take other medicine(s) before treatment with this medicine. If you experience allergic reactions like skin rash, itching or hives, swelling of the face, lips, or tongue, tell your doctor or health care professional right away. In some cases, you may be given additional medicines to help with side effects. Follow all directions for their use. This drug may make you feel generally unwell. This is not uncommon, as chemotherapy can affect healthy cells as well as cancer cells. Report any side effects. Continue your course of treatment even though you feel ill unless your doctor tells you to stop. Call your doctor or health care professional for advice if you get a fever, chills or sore throat, or other symptoms of a cold or flu. Do not treat yourself. This drug decreases your body's ability to fight infections. Try to avoid being around people who are sick. This medicine may increase your risk to bruise   or bleed. Call your doctor or health care professional if you notice any unusual bleeding. Be careful brushing and flossing your teeth or using a toothpick because you may get an infection or bleed more easily.  If you have any dental work done, tell your dentist you are receiving this medicine. Avoid taking products that contain aspirin, acetaminophen, ibuprofen, naproxen, or ketoprofen unless instructed by your doctor. These medicines may hide a fever. Do not become pregnant while taking this medicine. Women should inform their doctor if they wish to become pregnant or think they might be pregnant. There is a potential for serious side effects to an unborn child. Talk to your health care professional or pharmacist for more information. Do not breast-feed an infant while taking this medicine. Men are advised not to father a child while receiving this medicine. This product may contain alcohol. Ask your pharmacist or healthcare provider if this medicine contains alcohol. Be sure to tell all healthcare providers you are taking this medicine. Certain medicines, like metronidazole and disulfiram, can cause an unpleasant reaction when taken with alcohol. The reaction includes flushing, headache, nausea, vomiting, sweating, and increased thirst. The reaction can last from 30 minutes to several hours. What side effects may I notice from receiving this medicine? Side effects that you should report to your doctor or health care professional as soon as possible:  allergic reactions like skin rash, itching or hives, swelling of the face, lips, or tongue  breathing problems  changes in vision  fast, irregular heartbeat  high or low blood pressure  mouth sores  pain, tingling, numbness in the hands or feet  signs of decreased platelets or bleeding - bruising, pinpoint red spots on the skin, black, tarry stools, blood in the urine  signs of decreased red blood cells - unusually weak or tired, feeling faint or lightheaded, falls  signs of infection - fever or chills, cough, sore throat, pain or difficulty passing urine  signs and symptoms of liver injury like dark yellow or brown urine; general ill feeling or  flu-like symptoms; light-colored stools; loss of appetite; nausea; right upper belly pain; unusually weak or tired; yellowing of the eyes or skin  swelling of the ankles, feet, hands  unusually slow heartbeat Side effects that usually do not require medical attention (report to your doctor or health care professional if they continue or are bothersome):  diarrhea  hair loss  loss of appetite  muscle or joint pain  nausea, vomiting  pain, redness, or irritation at site where injected  tiredness This list may not describe all possible side effects. Call your doctor for medical advice about side effects. You may report side effects to FDA at 1-800-FDA-1088. Where should I keep my medicine? This drug is given in a hospital or clinic and will not be stored at home. NOTE: This sheet is a summary. It may not cover all possible information. If you have questions about this medicine, talk to your doctor, pharmacist, or health care provider.  2020 Elsevier/Gold Standard (2016-09-29 13:14:55)  

## 2019-03-31 NOTE — Assessment & Plan Note (Signed)
12/07/2018:Patient palpated a lump in the left breast. Mammogram showed a 1.4cm mass at the 5 o'clock position in the left breast, a left axillary lymph node measuring 2.8cm, and 2 smaller axillary lymph nodes with cortical thickening. 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 Neoadjuvant chemotherapy with Adriamycin and Cytoxan dose dense 4 followed byTaxolweekly 12with carboplatin every 3 weeks--carbo eliminated due to cytopenias 2. Followed by breast conserving surgery withaxillary lymph nodedissection(6 lymph nodes were found on MRI) 3. Followed by adjuvant radiation therapy  Breast MRI on 12/21/18 showed the known 1.5cm mass in the left breast, at least 6 abnormal left axillary lymph nodes, and no right breast malignancy -------------------------------------------------------------------------------------------------------------------------------------------------- Current treatment:Completed 4 cycles ofdose dense Adriamycin and Cytoxan, today is cycle 4 Taxol Echocardiogram: 12/21/2018: EF 60 to 65%  Chemo toxicities: 1.  Severe cytopenias: Carboplatin discontinued.  Primarily due to thrombocytopenia.  Taxol dose reduced to 50 mg meter square.  Monitoring closely. No clinical signs symptoms of neuropathy.  Return to clinic weekly for Taxol every other week for follow-up with me.

## 2019-03-31 NOTE — Progress Notes (Signed)
Okay to treat with ANC 1.4, per Dr. Lindi Adie.

## 2019-04-06 NOTE — Progress Notes (Signed)
Patient Care Team: [name], FNP as PCP - General (Family Medicine) Mauro Kaufmann, RN as Oncology Nurse Navigator Rockwell Germany, RN as Oncology Nurse Navigator  DIAGNOSIS:    ICD-10-CM   1. 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 Mammogram showed a 1.4cm mass at the 5 o'clock position in the left breast, a left axillary lymph node measuring 2.8cm, and 2 smaller axillary lymph nodes with cortical thickening. 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   12/14/2018 Cancer Staging   Staging form: Breast, AJCC 8th Edition - Clinical: Stage IIB (cT1c, cN1, cM0, G3, ER-, PR-, HER2-) - Signed by Nicholas Lose, MD on 12/14/2018   12/23/2018 -  Chemotherapy   The patient had DOXOrubicin (ADRIAMYCIN) chemo injection 98 mg, 60 mg/m2 = 98 mg, Intravenous,  Once, 4 of 4 cycles Dose modification: 50 mg/m2 (original dose 60 mg/m2, Cycle 2, Reason: Dose not tolerated) Administration: 98 mg (12/23/2018), 82 mg (01/06/2019), 82 mg (01/20/2019), 82 mg (02/02/2019) palonosetron (ALOXI) injection 0.25 mg, 0.25 mg, Intravenous,  Once, 6 of 8 cycles Administration: 0.25 mg (12/23/2018), 0.25 mg (02/17/2019), 0.25 mg (01/06/2019), 0.25 mg (01/20/2019), 0.25 mg (02/02/2019), 0.25 mg (03/31/2019) pegfilgrastim-jmdb (FULPHILA) injection 6 mg, 6 mg, Subcutaneous,  Once, 4 of 4 cycles Administration: 6 mg (12/26/2018), 6 mg (01/09/2019), 6 mg (01/23/2019), 6 mg (02/04/2019) CARBOplatin (PARAPLATIN) 570 mg in sodium chloride 0.9 % 250 mL chemo infusion, 570 mg (105.4 % of original dose 544.2 mg), Intravenous,  Once, 1 of 1 cycle Dose modification:    (original dose 544.2 mg, Cycle 5) Administration: 570 mg (02/17/2019) cyclophosphamide (CYTOXAN) 980 mg in sodium chloride 0.9 % 250 mL chemo infusion, 600 mg/m2 = 980 mg, Intravenous,  Once, 4 of 4 cycles Dose modification: 500 mg/m2 (original dose 600 mg/m2, Cycle 2, Reason: Dose not tolerated) Administration: 980 mg (12/23/2018), 820 mg (01/06/2019), 820 mg (01/20/2019), 820 mg (02/02/2019) PACLitaxel (TAXOL) 132 mg in sodium chloride 0.9 % 250 mL chemo infusion (</= '80mg'$ /m2), 80 mg/m2 = 132 mg, Intravenous,  Once, 2 of 4 cycles Dose modification: 65 mg/m2 (original dose 80 mg/m2, Cycle 5, Reason: Provider Judgment), 50 mg/m2 (original dose 80 mg/m2, Cycle 6, Reason: Provider Judgment) Administration: 132 mg (02/17/2019), 108 mg (02/24/2019), 84 mg (03/31/2019), 84 mg (03/24/2019) fosaprepitant (EMEND) 150 mg, dexamethasone (DECADRON) 12 mg in sodium chloride 0.9 % 145 mL IVPB, , Intravenous,  Once, 6 of 8 cycles Administration:  (12/23/2018),  (02/17/2019),  (01/06/2019),  (01/20/2019),  (02/02/2019),  (03/31/2019)  for chemotherapy treatment.      CHIEF COMPLIANT: Cycle5 Taxol  INTERVAL HISTORY: Heather Hebert is a 59 y.o. 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 She is currently receiving neoadjuvant chemotherapywith weekly Taxol after completing 4 cycles ofdose dense Adriamycin and Cytoxan.She presents to the clinic todayforcycle5.  She is feeling extremely good.  Denies any neuropathy.  Denies any nausea or vomiting.  ALLERGIES:  has No Known Allergies.  MEDICATIONS:  Current Outpatient Medications  Medication Sig Dispense Refill  . Calcium Carb-Cholecalciferol (CALCIUM 600 + D PO) Take 1 tablet by mouth 2 (two) times daily.    Marland Kitchen  cetirizine (ZYRTEC) 10 MG tablet Take 10 mg by mouth daily.    . Flax OIL Take by mouth. One cap per day    . fluocinolone (SYNALAR) 0.025 % cream Apply topically 3 (three) times daily. 120 g 1  . lidocaine-prilocaine (EMLA) cream  Apply to affected area once 30 g 3  . LORazepam (ATIVAN) 0.5 MG tablet Take 1 tablet (0.5 mg total) by mouth at bedtime as needed for sleep. 30 tablet 0  . methylPREDNISolone (MEDROL DOSEPAK) 4 MG TBPK tablet Use as directed 21 tablet 0  . Multiple Vitamin (MULTIVITAMIN WITH MINERALS) TABS tablet Take 1 tablet by mouth daily.    . Olopatadine HCl (PATADAY OP) Place 1 drop into both eyes daily as needed. For dry eyes    . ondansetron (ZOFRAN) 8 MG tablet Take 1 tablet (8 mg total) by mouth 2 (two) times daily as needed. Start on the third day after chemotherapy. 30 tablet 1  . predniSONE (DELTASONE) 5 MG tablet 6 tab x 1 day, 5 tab x 1 day, 4 tab x 1 day, 3 tab x 1 day, 2 tab x 1 day, 1 tab x 1 day, stop 21 tablet 0  . Probiotic Product (PROBIOTIC PO) Take 1 tablet by mouth every other day.     . prochlorperazine (COMPAZINE) 10 MG tablet Take 1 tablet (10 mg total) by mouth every 6 (six) hours as needed (Nausea or vomiting). 30 tablet 1   No current facility-administered medications for this visit.    PHYSICAL EXAMINATION: ECOG PERFORMANCE STATUS: 1 - Symptomatic but completely ambulatory  Vitals:   04/07/19 1048  BP: 120/66  Pulse: 94  Resp: 16  Temp: 98 F (36.7 C)  SpO2: 100%   Filed Weights   04/07/19 1048  Weight: 129 lb 8 oz (58.7 kg)    LABORATORY DATA:  I have reviewed the data as listed CMP Latest Ref Rng & Units 03/31/2019 03/23/2019 03/17/2019  Glucose 70 - 99 mg/dL 107(H) 88 105(H)  BUN 6 - 20 mg/dL _0 Creatinine 0.44 - 1.00 mg/dL 0.69 0.63 0.67  Sodium 135 - 145 mmol/L 140 141 138  Potassium 3.5 - 5.1 mmol/L 3.6 4.0 3.9  Chloride 98 - 111 mmol/L 104 106 104  CO2 22 - 32 mmol/L _1 Calcium 8.9 - 10.3 mg/dL 9.1 9.3 8.9  Total Protein 6.5 - 8.1 g/dL 6.6 6.7 6.5  Total Bilirubin 0.3 - 1.2 mg/dL 0.3 0.2(L) 0.4  Alkaline Phos 38 - 126 U/L 90 90 83  AST 15 - 41 U/L 34 20 21  ALT 0 - 44 U/L 34 15 15    Lab Results  Component Value Date   WBC 3.0 (L)  04/07/2019   HGB 10.2 (L) 04/07/2019   HCT 32.0 (L) 04/07/2019   MCV 104.6 (H) 04/07/2019   PLT 189 04/07/2019   NEUTROABS 1.9 04/07/2019    ASSESSMENT & PLAN:  Malignant neoplasm of lower-outer quadrant of left breast of female, estrogen receptor negative ([place]) 12/07/2018:Patient palpated a lump in the left breast. Mammogram showed a 1.4cm mass at the 5 o'clock position in the left breast, a left axillary lymph node measuring 2.8cm, and 2 smaller axillary lymph nodes with cortical thickening. 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 Neoadjuvant chemotherapy with Adriamycin and Cytoxan dose dense 4 followed byTaxolweekly 12with carboplatin every 3 weeks--carbo eliminated due to cytopenias  2. Followed by breast conserving surgery withaxillary lymph nodedissection(6 lymph nodes were found on MRI) 3. Followed by adjuvant radiation therapy  Breast MRI on 12/21/18 showed the known 1.5cm mass in the left breast, at least 6 abnormal left axillary lymph nodes, and no right breast malignancy -------------------------------------------------------------------------------------------------------------------------------------------------- Current treatment:Completed 4 cycles ofdose dense Adriamycin and Cytoxan, today is cycle 6Taxol Echocardiogram: 12/21/2018: EF 60 to 65%  Chemo toxicities: 1.  Severe cytopenias: Carboplatin discontinued.  Primarily due to thrombocytopenia.  Taxol dose reduced to 50 mg meter square.  Monitoring closely. No clinical signs symptoms of neuropathy. 2.  Leukopenia/neutropenia: We are watching this very closely.    Today's ANC is 1.9 3.  Chemotherapy-induced anemia: Slowly improving.  Hemoglobin is 10.2  We will keep the dosage of Taxol the same.  We will consider increasing the dosage of Taxol next week if her labs still remain good. Return to clinic weekly for Taxol every  other week for follow-up with me.    No orders of the defined types were placed in this encounter.  The patient has a good understanding of the overall plan. she agrees with it. she will call with any problems that may develop before the next visit here.  Total time spent: 30 mins including face to face time and time spent for planning, charting and coordination of care  Nicholas Lose, MD 04/07/2019  I, Cloyde Reams Dorshimer, am acting as scribe for Dr. Nicholas Lose.  I have reviewed the above documentation for accuracy and completeness, and I agree with the above.

## 2019-04-07 ENCOUNTER — Inpatient Hospital Stay: Payer: Commercial Managed Care - PPO

## 2019-04-07 ENCOUNTER — Inpatient Hospital Stay (HOSPITAL_BASED_OUTPATIENT_CLINIC_OR_DEPARTMENT_OTHER): Payer: Commercial Managed Care - PPO | Admitting: Hematology and Oncology

## 2019-04-07 ENCOUNTER — Other Ambulatory Visit: Payer: Self-pay

## 2019-04-07 ENCOUNTER — Telehealth: Payer: Self-pay | Admitting: Hematology and Oncology

## 2019-04-07 DIAGNOSIS — Z171 Estrogen receptor negative status [ER-]: Secondary | ICD-10-CM | POA: Diagnosis not present

## 2019-04-07 DIAGNOSIS — C50512 Malignant neoplasm of lower-outer quadrant of left female breast: Secondary | ICD-10-CM | POA: Diagnosis not present

## 2019-04-07 DIAGNOSIS — Z95828 Presence of other vascular implants and grafts: Secondary | ICD-10-CM

## 2019-04-07 LAB — CMP (CANCER CENTER ONLY)
ALT: 14 U/L (ref 0–44)
AST: 20 U/L (ref 15–41)
Albumin: 3.6 g/dL (ref 3.5–5.0)
Alkaline Phosphatase: 92 U/L (ref 38–126)
Anion gap: 10 (ref 5–15)
BUN: 9 mg/dL (ref 6–20)
CO2: 27 mmol/L (ref 22–32)
Calcium: 9 mg/dL (ref 8.9–10.3)
Chloride: 104 mmol/L (ref 98–111)
Creatinine: 0.68 mg/dL (ref 0.44–1.00)
GFR, Est AFR Am: >60 mL/min (ref >60)
GFR, Estimated: >60 mL/min (ref >60)
Glucose, Bld: 113 mg/dL — ABNORMAL HIGH (ref 70–99)
Potassium: 3.7 mmol/L (ref 3.5–5.1)
Sodium: 141 mmol/L (ref 135–145)
Total Bilirubin: 0.3 mg/dL (ref 0.3–1.2)
Total Protein: 6.5 g/dL (ref 6.5–8.1)

## 2019-04-07 LAB — CBC WITH DIFFERENTIAL (CANCER CENTER ONLY)
Abs Immature Granulocytes: 0.02 10*3/uL (ref 0.00–0.07)
Basophils Absolute: 0 10*3/uL (ref 0.0–0.1)
Basophils Relative: 1 %
Eosinophils Absolute: 0.1 10*3/uL (ref 0.0–0.5)
Eosinophils Relative: 2 %
HCT: 32 % — ABNORMAL LOW (ref 36.0–46.0)
Hemoglobin: 10.2 g/dL — ABNORMAL LOW (ref 12.0–15.0)
Immature Granulocytes: 1 %
Lymphocytes Relative: 16 %
Lymphs Abs: 0.5 10*3/uL — ABNORMAL LOW (ref 0.7–4.0)
MCH: 33.3 pg (ref 26.0–34.0)
MCHC: 31.9 g/dL (ref 30.0–36.0)
MCV: 104.6 fL — ABNORMAL HIGH (ref 80.0–100.0)
Monocytes Absolute: 0.5 10*3/uL (ref 0.1–1.0)
Monocytes Relative: 17 %
Neutro Abs: 1.9 10*3/uL (ref 1.7–7.7)
Neutrophils Relative %: 63 %
Platelet Count: 189 10*3/uL (ref 150–400)
RBC: 3.06 MIL/uL — ABNORMAL LOW (ref 3.87–5.11)
RDW: 18.6 % — ABNORMAL HIGH (ref 11.5–15.5)
WBC Count: 3 10*3/uL — ABNORMAL LOW (ref 4.0–10.5)
nRBC: 0 % (ref 0.0–0.2)

## 2019-04-07 MED ORDER — SODIUM CHLORIDE 0.9 % IV SOLN
20.0000 mg | Freq: Once | INTRAVENOUS | Status: AC
  Administered 2019-04-07: 20 mg via INTRAVENOUS
  Filled 2019-04-07: qty 20

## 2019-04-07 MED ORDER — HEPARIN SOD (PORK) LOCK FLUSH 100 UNIT/ML IV SOLN
500.0000 [IU] | Freq: Once | INTRAVENOUS | Status: AC | PRN
  Administered 2019-04-07: 500 [IU]
  Filled 2019-04-07: qty 5

## 2019-04-07 MED ORDER — DIPHENHYDRAMINE HCL 50 MG/ML IJ SOLN
INTRAMUSCULAR | Status: AC
  Filled 2019-04-07: qty 1

## 2019-04-07 MED ORDER — SODIUM CHLORIDE 0.9 % IV SOLN
Freq: Once | INTRAVENOUS | Status: AC
  Filled 2019-04-07: qty 250

## 2019-04-07 MED ORDER — SODIUM CHLORIDE 0.9 % IV SOLN
50.0000 mg/m2 | Freq: Once | INTRAVENOUS | Status: AC
  Administered 2019-04-07: 84 mg via INTRAVENOUS
  Filled 2019-04-07: qty 14

## 2019-04-07 MED ORDER — SODIUM CHLORIDE 0.9% FLUSH
10.0000 mL | INTRAVENOUS | Status: DC | PRN
  Administered 2019-04-07: 10 mL
  Filled 2019-04-07: qty 10

## 2019-04-07 MED ORDER — FAMOTIDINE IN NACL 20-0.9 MG/50ML-% IV SOLN
INTRAVENOUS | Status: AC
  Filled 2019-04-07: qty 50

## 2019-04-07 MED ORDER — FAMOTIDINE IN NACL 20-0.9 MG/50ML-% IV SOLN
20.0000 mg | Freq: Once | INTRAVENOUS | Status: AC
  Administered 2019-04-07: 20 mg via INTRAVENOUS

## 2019-04-07 MED ORDER — DIPHENHYDRAMINE HCL 50 MG/ML IJ SOLN
25.0000 mg | Freq: Once | INTRAMUSCULAR | Status: AC
  Administered 2019-04-07: 25 mg via INTRAVENOUS

## 2019-04-07 NOTE — Assessment & Plan Note (Signed)
12/07/2018:Patient palpated a lump in the left breast. Mammogram showed a 1.4cm mass at the 5 o'clock position in the left breast, a left axillary lymph node measuring 2.8cm, and 2 smaller axillary lymph nodes with cortical thickening. 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 Neoadjuvant chemotherapy with Adriamycin and Cytoxan dose dense 4 followed byTaxolweekly 12with carboplatin every 3 weeks--carbo eliminated due to cytopenias 2. Followed by breast conserving surgery withaxillary lymph nodedissection(6 lymph nodes were found on MRI) 3. Followed by adjuvant radiation therapy  Breast MRI on 12/21/18 showed the known 1.5cm mass in the left breast, at least 6 abnormal left axillary lymph nodes, and no right breast malignancy -------------------------------------------------------------------------------------------------------------------------------------------------- Current treatment:Completed 4 cycles ofdose dense Adriamycin and Cytoxan, today is cycle 6Taxol Echocardiogram: 12/21/2018: EF 60 to 65%  Chemo toxicities: 1.  Severe cytopenias: Carboplatin discontinued.  Primarily due to thrombocytopenia.  Taxol dose reduced to 50 mg meter square.  Monitoring closely. No clinical signs symptoms of neuropathy. 2.  Leukopenia/neutropenia: We are watching this very closely.  We are okay to treat as long as ANC is greater than 1.2. 3.  Chemotherapy-induced anemia: Slowly improving.  We will keep the dosage of Taxol the same. Return to clinic weekly for Taxol every other week for follow-up with me.

## 2019-04-07 NOTE — Telephone Encounter (Signed)
I talk with patient regarding schedule  

## 2019-04-07 NOTE — Patient Instructions (Signed)
[facility] Discharge Instructions for Patients Receiving Chemotherapy  Today you received the following chemotherapy agents:  Taxol.  To help prevent nausea and vomiting after your treatment, we encourage you to take your nausea medication as directed.   If you develop nausea and vomiting that is not controlled by your nausea medication, call the clinic.   BELOW ARE SYMPTOMS THAT SHOULD BE REPORTED IMMEDIATELY:  *FEVER GREATER THAN 100.5 F  *CHILLS WITH OR WITHOUT FEVER  NAUSEA AND VOMITING THAT IS NOT CONTROLLED WITH YOUR NAUSEA MEDICATION  *UNUSUAL SHORTNESS OF BREATH  *UNUSUAL BRUISING OR BLEEDING  TENDERNESS IN MOUTH AND THROAT WITH OR WITHOUT PRESENCE OF ULCERS  *URINARY PROBLEMS  *BOWEL PROBLEMS  UNUSUAL RASH Items with * indicate a potential emergency and should be followed up as soon as possible.  Feel free to call the clinic should you have any questions or concerns. The clinic phone number is [phone].  Please show the CHEMO ALERT CARD at check-in to the Emergency Department and triage nurse.   

## 2019-04-13 NOTE — Progress Notes (Signed)
Patient Care Team: [name], FNP as PCP - General (Family Medicine) Mauro Kaufmann, RN as Oncology Nurse Navigator Rockwell Germany, RN as Oncology Nurse Navigator  DIAGNOSIS:    ICD-10-CM   1. Malignant neoplasm of lower-outer quadrant of left breast of female, estrogen receptor negative ([place])  C50.512    Z17.1     SUMMARY OF ONCOLOGIC HISTORY: Oncology History  Malignant neoplasm of lower-outer quadrant of left breast of female, estrogen receptor negative ([place])  12/07/2018 Initial Diagnosis   Patient palpated a lump in the left breast. Mammogram showed a 1.4cm mass at the 5 o'clock position in the left breast, a left axillary lymph node measuring 2.8cm, and 2 smaller axillary lymph nodes with cortical thickening. 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   12/14/2018 Cancer Staging   Staging form: Breast, AJCC 8th Edition - Clinical: Stage IIB (cT1c, cN1, cM0, G3, ER-, PR-, HER2-) - Signed by Nicholas Lose, MD on 12/14/2018   12/23/2018 -  Chemotherapy   The patient had DOXOrubicin (ADRIAMYCIN) chemo injection 98 mg, 60 mg/m2 = 98 mg, Intravenous,  Once, 4 of 4 cycles Dose modification: 50 mg/m2 (original dose 60 mg/m2, Cycle 2, Reason: Dose not tolerated) Administration: 98 mg (12/23/2018), 82 mg (01/06/2019), 82 mg (01/20/2019), 82 mg (02/02/2019) palonosetron (ALOXI) injection 0.25 mg, 0.25 mg, Intravenous,  Once, 6 of 8 cycles Administration: 0.25 mg (12/23/2018), 0.25 mg (02/17/2019), 0.25 mg (01/06/2019), 0.25 mg (01/20/2019), 0.25 mg (02/02/2019), 0.25 mg (03/31/2019) pegfilgrastim-jmdb (FULPHILA) injection 6 mg, 6 mg, Subcutaneous,  Once, 4 of 4 cycles Administration: 6 mg (12/26/2018), 6 mg (01/09/2019), 6 mg (01/23/2019), 6 mg (02/04/2019) CARBOplatin (PARAPLATIN) 570 mg in sodium chloride 0.9 % 250 mL chemo infusion, 570 mg (105.4 % of original dose 544.2 mg), Intravenous,  Once, 1 of 1 cycle Dose modification:    (original dose 544.2 mg, Cycle 5) Administration: 570 mg (02/17/2019) cyclophosphamide (CYTOXAN) 980 mg in sodium chloride 0.9 % 250 mL chemo infusion, 600 mg/m2 = 980 mg, Intravenous,  Once, 4 of 4 cycles Dose modification: 500 mg/m2 (original dose 600 mg/m2, Cycle 2, Reason: Dose not tolerated) Administration: 980 mg (12/23/2018), 820 mg (01/06/2019), 820 mg (01/20/2019), 820 mg (02/02/2019) PACLitaxel (TAXOL) 132 mg in sodium chloride 0.9 % 250 mL chemo infusion (</= 57m/m2), 80 mg/m2 = 132 mg, Intravenous,  Once, 2 of 4 cycles Dose modification: 65 mg/m2 (original dose 80 mg/m2, Cycle 5, Reason: Provider Judgment), 50 mg/m2 (original dose 80 mg/m2, Cycle 6, Reason: Provider Judgment) Administration: 132 mg (02/17/2019), 108 mg (02/24/2019), 84 mg (03/31/2019), 84 mg (04/07/2019), 84 mg (03/24/2019) fosaprepitant (EMEND) 150 mg, dexamethasone (DECADRON) 12 mg in sodium chloride 0.9 % 145 mL IVPB, , Intravenous,  Once, 6 of 8 cycles Administration:  (12/23/2018),  (02/17/2019),  (01/06/2019),  (01/20/2019),  (02/02/2019),  (03/31/2019)  for chemotherapy treatment.      CHIEF COMPLIANT: Cycle6Taxol  INTERVAL HISTORY: Heather Brumleyis a 59y.o. 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 She is currently receiving neoadjuvant chemotherapywith weekly Taxol after completing 4 cycles ofdose dense Adriamycin and Cytoxan.She presents to the clinic todayforcycle6.  She has had no side effects from Taxol.  ALLERGIES:  has No Known Allergies.  MEDICATIONS:  Current Outpatient Medications  Medication Sig Dispense Refill  . Calcium Carb-Cholecalciferol (CALCIUM 600 + D PO) Take 1 tablet by mouth 2 (two) times daily.    . cetirizine (ZYRTEC) 10 MG tablet  Take 10 mg by mouth daily.    . Flax OIL Take by mouth. One cap per day    . fluocinolone (SYNALAR) 0.025 % cream Apply topically 3 (three) times daily. 120 g 1  . lidocaine-prilocaine (EMLA) cream Apply to affected area once  30 g 3  . LORazepam (ATIVAN) 0.5 MG tablet Take 1 tablet (0.5 mg total) by mouth at bedtime as needed for sleep. 30 tablet 0  . methylPREDNISolone (MEDROL DOSEPAK) 4 MG TBPK tablet Use as directed 21 tablet 0  . Multiple Vitamin (MULTIVITAMIN WITH MINERALS) TABS tablet Take 1 tablet by mouth daily.    . Olopatadine HCl (PATADAY OP) Place 1 drop into both eyes daily as needed. For dry eyes    . ondansetron (ZOFRAN) 8 MG tablet Take 1 tablet (8 mg total) by mouth 2 (two) times daily as needed. Start on the third day after chemotherapy. 30 tablet 1  . predniSONE (DELTASONE) 5 MG tablet 6 tab x 1 day, 5 tab x 1 day, 4 tab x 1 day, 3 tab x 1 day, 2 tab x 1 day, 1 tab x 1 day, stop 21 tablet 0  . Probiotic Product (PROBIOTIC PO) Take 1 tablet by mouth every other day.     . prochlorperazine (COMPAZINE) 10 MG tablet Take 1 tablet (10 mg total) by mouth every 6 (six) hours as needed (Nausea or vomiting). 30 tablet 1   No current facility-administered medications for this visit.    PHYSICAL EXAMINATION: ECOG PERFORMANCE STATUS: 1 - Symptomatic but completely ambulatory  Vitals:   04/14/19 1115  BP: 125/70  Pulse: 86  Resp: 18  Temp: 99.1 F (37.3 C)  SpO2: 100%   Filed Weights   04/14/19 1115  Weight: 130 lb 12.8 oz (59.3 kg)    LABORATORY DATA:  I have reviewed the data as listed CMP Latest Ref Rng & Units 04/07/2019 03/31/2019 03/23/2019  Glucose 70 - 99 mg/dL 113(H) 107(H) 88  BUN 6 - 20 mg/dL '9 9 9  ' Creatinine 0.44 - 1.00 mg/dL 0.68 0.69 0.63  Sodium 135 - 145 mmol/L 141 140 141  Potassium 3.5 - 5.1 mmol/L 3.7 3.6 4.0  Chloride 98 - 111 mmol/L 104 104 106  CO2 22 - 32 mmol/L '27 27 27  ' Calcium 8.9 - 10.3 mg/dL 9.0 9.1 9.3  Total Protein 6.5 - 8.1 g/dL 6.5 6.6 6.7  Total Bilirubin 0.3 - 1.2 mg/dL 0.3 0.3 0.2(L)  Alkaline Phos 38 - 126 U/L 92 90 90  AST 15 - 41 U/L 20 34 20  ALT 0 - 44 U/L 14 34 15    Lab Results  Component Value Date   WBC 4.0 04/14/2019   HGB 10.1 (L)  04/14/2019   HCT 30.8 (L) 04/14/2019   MCV 103.7 (H) 04/14/2019   PLT 180 04/14/2019   NEUTROABS 2.8 04/14/2019    ASSESSMENT & PLAN:  Malignant neoplasm of lower-outer quadrant of left breast of female, estrogen receptor negative ([place]) 12/07/2018:Patient palpated a lump in the left breast. Mammogram showed a 1.4cm mass at the 5 o'clock position in the left breast, a left axillary lymph node measuring 2.8cm, and 2 smaller axillary lymph nodes with cortical thickening. 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 Neoadjuvant chemotherapy with Adriamycin and Cytoxan dose dense 4 followed byTaxolweekly 12with carboplatin every 3 weeks--carbo eliminated due to cytopenias 2. Followed by breast conserving surgery  withaxillary lymph nodedissection(6 lymph nodes were found on MRI) 3. Followed by adjuvant radiation therapy  Breast MRI on 12/21/18 showed the known 1.5cm mass in the left breast, at least 6 abnormal left axillary lymph nodes, and no right breast malignancy -------------------------------------------------------------------------------------------------------------------------------------------------- Current treatment:Completed 4 cycles ofdose dense Adriamycin and Cytoxan,today [email or web address] Echocardiogram: 12/21/2018: EF 60 to 65%  Chemo toxicities: 1.Severe cytopenias: Carboplatin discontinued. Primarily due to thrombocytopenia. Taxol dose reduced to 50 mg meter square. Monitoring closely. No clinical signs symptoms of neuropathy. 2.Leukopenia/neutropenia: We are watching this very closely.   Today's ANC is 2.8 3.Chemotherapy-induced anemia: Slowly improving.  Hemoglobin is 10.1 We will increase the dosage of Taxol to 65 mg per metered squared today. Return to clinic weekly for Taxol every other week for follow-up with me.    No orders of the defined types were placed in this  encounter.  The patient has a good understanding of the overall plan. she agrees with it. she will call with any problems that may develop before the next visit here.  Total time spent: 30 mins including face to face time and time spent for planning, charting and coordination of care  Nicholas Lose, MD 04/14/2019  I, Cloyde Reams Dorshimer, am acting as scribe for Dr. Nicholas Lose.  I have reviewed the above documentation for accuracy and completeness, and I agree with the above.

## 2019-04-14 ENCOUNTER — Inpatient Hospital Stay: Payer: Commercial Managed Care - PPO

## 2019-04-14 ENCOUNTER — Other Ambulatory Visit: Payer: Commercial Managed Care - PPO

## 2019-04-14 ENCOUNTER — Inpatient Hospital Stay: Payer: Commercial Managed Care - PPO | Admitting: Hematology and Oncology

## 2019-04-14 ENCOUNTER — Other Ambulatory Visit: Payer: Self-pay

## 2019-04-14 ENCOUNTER — Ambulatory Visit: Payer: Commercial Managed Care - PPO

## 2019-04-14 ENCOUNTER — Encounter: Payer: Self-pay | Admitting: *Deleted

## 2019-04-14 ENCOUNTER — Inpatient Hospital Stay (HOSPITAL_BASED_OUTPATIENT_CLINIC_OR_DEPARTMENT_OTHER): Payer: Commercial Managed Care - PPO | Admitting: Hematology and Oncology

## 2019-04-14 ENCOUNTER — Inpatient Hospital Stay: Payer: Commercial Managed Care - PPO | Attending: Hematology and Oncology

## 2019-04-14 DIAGNOSIS — T451X5D Adverse effect of antineoplastic and immunosuppressive drugs, subsequent encounter: Secondary | ICD-10-CM | POA: Insufficient documentation

## 2019-04-14 DIAGNOSIS — Z7952 Long term (current) use of systemic steroids: Secondary | ICD-10-CM | POA: Insufficient documentation

## 2019-04-14 DIAGNOSIS — Z5111 Encounter for antineoplastic chemotherapy: Secondary | ICD-10-CM | POA: Insufficient documentation

## 2019-04-14 DIAGNOSIS — Z79899 Other long term (current) drug therapy: Secondary | ICD-10-CM | POA: Diagnosis not present

## 2019-04-14 DIAGNOSIS — C50512 Malignant neoplasm of lower-outer quadrant of left female breast: Secondary | ICD-10-CM

## 2019-04-14 DIAGNOSIS — Z95828 Presence of other vascular implants and grafts: Secondary | ICD-10-CM

## 2019-04-14 DIAGNOSIS — Z171 Estrogen receptor negative status [ER-]: Secondary | ICD-10-CM | POA: Diagnosis not present

## 2019-04-14 DIAGNOSIS — D6481 Anemia due to antineoplastic chemotherapy: Secondary | ICD-10-CM | POA: Diagnosis not present

## 2019-04-14 LAB — CMP (CANCER CENTER ONLY)
ALT: 13 U/L (ref 0–44)
AST: 19 U/L (ref 15–41)
Albumin: 3.5 g/dL (ref 3.5–5.0)
Alkaline Phosphatase: 92 U/L (ref 38–126)
Anion gap: 7 (ref 5–15)
BUN: 10 mg/dL (ref 6–20)
CO2: 28 mmol/L (ref 22–32)
Calcium: 9 mg/dL (ref 8.9–10.3)
Chloride: 105 mmol/L (ref 98–111)
Creatinine: 0.64 mg/dL (ref 0.44–1.00)
GFR, Est AFR Am: >60 mL/min (ref >60)
GFR, Estimated: >60 mL/min (ref >60)
Glucose, Bld: 90 mg/dL (ref 70–99)
Potassium: 3.9 mmol/L (ref 3.5–5.1)
Sodium: 140 mmol/L (ref 135–145)
Total Bilirubin: 0.2 mg/dL — ABNORMAL LOW (ref 0.3–1.2)
Total Protein: 6.3 g/dL — ABNORMAL LOW (ref 6.5–8.1)

## 2019-04-14 LAB — CBC WITH DIFFERENTIAL (CANCER CENTER ONLY)
Abs Immature Granulocytes: 0.04 10*3/uL (ref 0.00–0.07)
Basophils Absolute: 0 10*3/uL (ref 0.0–0.1)
Basophils Relative: 1 %
Eosinophils Absolute: 0 10*3/uL (ref 0.0–0.5)
Eosinophils Relative: 1 %
HCT: 30.8 % — ABNORMAL LOW (ref 36.0–46.0)
Hemoglobin: 10.1 g/dL — ABNORMAL LOW (ref 12.0–15.0)
Immature Granulocytes: 1 %
Lymphocytes Relative: 14 %
Lymphs Abs: 0.6 10*3/uL — ABNORMAL LOW (ref 0.7–4.0)
MCH: 34 pg (ref 26.0–34.0)
MCHC: 32.8 g/dL (ref 30.0–36.0)
MCV: 103.7 fL — ABNORMAL HIGH (ref 80.0–100.0)
Monocytes Absolute: 0.6 10*3/uL (ref 0.1–1.0)
Monocytes Relative: 14 %
Neutro Abs: 2.8 10*3/uL (ref 1.7–7.7)
Neutrophils Relative %: 69 %
Platelet Count: 180 10*3/uL (ref 150–400)
RBC: 2.97 MIL/uL — ABNORMAL LOW (ref 3.87–5.11)
RDW: 17.4 % — ABNORMAL HIGH (ref 11.5–15.5)
WBC Count: 4 10*3/uL (ref 4.0–10.5)
nRBC: 0 % (ref 0.0–0.2)

## 2019-04-14 MED ORDER — HEPARIN SOD (PORK) LOCK FLUSH 100 UNIT/ML IV SOLN
500.0000 [IU] | Freq: Once | INTRAVENOUS | Status: AC | PRN
  Administered 2019-04-14: 500 [IU]
  Filled 2019-04-14: qty 5

## 2019-04-14 MED ORDER — SODIUM CHLORIDE 0.9 % IV SOLN
Freq: Once | INTRAVENOUS | Status: AC
  Filled 2019-04-14: qty 250

## 2019-04-14 MED ORDER — SODIUM CHLORIDE 0.9 % IV SOLN
65.0000 mg/m2 | Freq: Once | INTRAVENOUS | Status: AC
  Administered 2019-04-14: 108 mg via INTRAVENOUS
  Filled 2019-04-14: qty 18

## 2019-04-14 MED ORDER — DIPHENHYDRAMINE HCL 50 MG/ML IJ SOLN
INTRAMUSCULAR | Status: AC
  Filled 2019-04-14: qty 1

## 2019-04-14 MED ORDER — FAMOTIDINE IN NACL 20-0.9 MG/50ML-% IV SOLN
20.0000 mg | Freq: Once | INTRAVENOUS | Status: AC
  Administered 2019-04-14: 20 mg via INTRAVENOUS

## 2019-04-14 MED ORDER — DIPHENHYDRAMINE HCL 50 MG/ML IJ SOLN
25.0000 mg | Freq: Once | INTRAMUSCULAR | Status: AC
  Administered 2019-04-14: 25 mg via INTRAVENOUS

## 2019-04-14 MED ORDER — SODIUM CHLORIDE 0.9 % IV SOLN
20.0000 mg | Freq: Once | INTRAVENOUS | Status: AC
  Administered 2019-04-14: 20 mg via INTRAVENOUS
  Filled 2019-04-14: qty 20

## 2019-04-14 MED ORDER — SODIUM CHLORIDE 0.9% FLUSH
10.0000 mL | INTRAVENOUS | Status: DC | PRN
  Administered 2019-04-14: 10 mL
  Filled 2019-04-14: qty 10

## 2019-04-14 MED ORDER — FAMOTIDINE IN NACL 20-0.9 MG/50ML-% IV SOLN
INTRAVENOUS | Status: AC
  Filled 2019-04-14: qty 50

## 2019-04-14 NOTE — Patient Instructions (Signed)

## 2019-04-14 NOTE — Assessment & Plan Note (Signed)
12/07/2018:Patient palpated a lump in the left breast. Mammogram showed a 1.4cm mass at the 5 o'clock position in the left breast, a left axillary lymph node measuring 2.8cm, and 2 smaller axillary lymph nodes with cortical thickening. 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 Neoadjuvant chemotherapy with Adriamycin and Cytoxan dose dense 4 followed byTaxolweekly 12with carboplatin every 3 weeks--carbo eliminated due to cytopenias 2. Followed by breast conserving surgery withaxillary lymph nodedissection(6 lymph nodes were found on MRI) 3. Followed by adjuvant radiation therapy  Breast MRI on 12/21/18 showed the known 1.5cm mass in the left breast, at least 6 abnormal left axillary lymph nodes, and no right breast malignancy -------------------------------------------------------------------------------------------------------------------------------------------------- Current treatment:Completed 4 cycles ofdose dense Adriamycin and Cytoxan,today [email or web address] Echocardiogram: 12/21/2018: EF 60 to 65%  Chemo toxicities: 1.Severe cytopenias: Carboplatin discontinued. Primarily due to thrombocytopenia. Taxol dose reduced to 50 mg meter square. Monitoring closely. No clinical signs symptoms of neuropathy. 2.Leukopenia/neutropenia: We are watching this very closely.   Today's ANC is 1.9 3.Chemotherapy-induced anemia: Slowly improving.  Hemoglobin is 10.2  We will keep the dosage of Taxol the same.  We will consider increasing the dosage of Taxol next week if her labs still remain good. Return to clinic weekly for Taxol every other week for follow-up with me.

## 2019-04-14 NOTE — Patient Instructions (Signed)
[facility] Discharge Instructions for Patients Receiving Chemotherapy  Today you received the following chemotherapy agents:  Taxol.  To help prevent nausea and vomiting after your treatment, we encourage you to take your nausea medication as directed.   If you develop nausea and vomiting that is not controlled by your nausea medication, call the clinic.   BELOW ARE SYMPTOMS THAT SHOULD BE REPORTED IMMEDIATELY:  *FEVER GREATER THAN 100.5 F  *CHILLS WITH OR WITHOUT FEVER  NAUSEA AND VOMITING THAT IS NOT CONTROLLED WITH YOUR NAUSEA MEDICATION  *UNUSUAL SHORTNESS OF BREATH  *UNUSUAL BRUISING OR BLEEDING  TENDERNESS IN MOUTH AND THROAT WITH OR WITHOUT PRESENCE OF ULCERS  *URINARY PROBLEMS  *BOWEL PROBLEMS  UNUSUAL RASH Items with * indicate a potential emergency and should be followed up as soon as possible.  Feel free to call the clinic should you have any questions or concerns. The clinic phone number is [phone].  Please show the CHEMO ALERT CARD at check-in to the Emergency Department and triage nurse.   

## 2019-04-21 ENCOUNTER — Inpatient Hospital Stay: Payer: Commercial Managed Care - PPO

## 2019-04-21 ENCOUNTER — Other Ambulatory Visit: Payer: Self-pay

## 2019-04-21 ENCOUNTER — Ambulatory Visit: Payer: Commercial Managed Care - PPO | Admitting: Hematology and Oncology

## 2019-04-21 VITALS — BP 122/77 | HR 77 | Temp 98.2°F | Resp 18

## 2019-04-21 DIAGNOSIS — Z5111 Encounter for antineoplastic chemotherapy: Secondary | ICD-10-CM | POA: Diagnosis not present

## 2019-04-21 DIAGNOSIS — Z95828 Presence of other vascular implants and grafts: Secondary | ICD-10-CM

## 2019-04-21 DIAGNOSIS — C50512 Malignant neoplasm of lower-outer quadrant of left female breast: Secondary | ICD-10-CM

## 2019-04-21 DIAGNOSIS — Z171 Estrogen receptor negative status [ER-]: Secondary | ICD-10-CM

## 2019-04-21 LAB — CBC WITH DIFFERENTIAL (CANCER CENTER ONLY)
Abs Immature Granulocytes: 0.06 10*3/uL (ref 0.00–0.07)
Basophils Absolute: 0 10*3/uL (ref 0.0–0.1)
Basophils Relative: 1 %
Eosinophils Absolute: 0.1 10*3/uL (ref 0.0–0.5)
Eosinophils Relative: 2 %
HCT: 32.6 % — ABNORMAL LOW (ref 36.0–46.0)
Hemoglobin: 10.6 g/dL — ABNORMAL LOW (ref 12.0–15.0)
Immature Granulocytes: 2 %
Lymphocytes Relative: 15 %
Lymphs Abs: 0.6 10*3/uL — ABNORMAL LOW (ref 0.7–4.0)
MCH: 33.9 pg (ref 26.0–34.0)
MCHC: 32.5 g/dL (ref 30.0–36.0)
MCV: 104.2 fL — ABNORMAL HIGH (ref 80.0–100.0)
Monocytes Absolute: 0.5 10*3/uL (ref 0.1–1.0)
Monocytes Relative: 12 %
Neutro Abs: 2.7 10*3/uL (ref 1.7–7.7)
Neutrophils Relative %: 68 %
Platelet Count: 211 10*3/uL (ref 150–400)
RBC: 3.13 MIL/uL — ABNORMAL LOW (ref 3.87–5.11)
RDW: 16.3 % — ABNORMAL HIGH (ref 11.5–15.5)
WBC Count: 4 10*3/uL (ref 4.0–10.5)
nRBC: 0 % (ref 0.0–0.2)

## 2019-04-21 LAB — CMP (CANCER CENTER ONLY)
ALT: 13 U/L (ref 0–44)
AST: 22 U/L (ref 15–41)
Albumin: 3.6 g/dL (ref 3.5–5.0)
Alkaline Phosphatase: 96 U/L (ref 38–126)
Anion gap: 9 (ref 5–15)
BUN: 9 mg/dL (ref 6–20)
CO2: 28 mmol/L (ref 22–32)
Calcium: 9.2 mg/dL (ref 8.9–10.3)
Chloride: 105 mmol/L (ref 98–111)
Creatinine: 0.67 mg/dL (ref 0.44–1.00)
GFR, Est AFR Am: >60 mL/min (ref >60)
GFR, Estimated: >60 mL/min (ref >60)
Glucose, Bld: 87 mg/dL (ref 70–99)
Potassium: 3.9 mmol/L (ref 3.5–5.1)
Sodium: 142 mmol/L (ref 135–145)
Total Bilirubin: 0.3 mg/dL (ref 0.3–1.2)
Total Protein: 6.4 g/dL — ABNORMAL LOW (ref 6.5–8.1)

## 2019-04-21 MED ORDER — FAMOTIDINE IN NACL 20-0.9 MG/50ML-% IV SOLN
20.0000 mg | Freq: Once | INTRAVENOUS | Status: AC
  Administered 2019-04-21: 20 mg via INTRAVENOUS

## 2019-04-21 MED ORDER — FAMOTIDINE IN NACL 20-0.9 MG/50ML-% IV SOLN
INTRAVENOUS | Status: AC
  Filled 2019-04-21: qty 50

## 2019-04-21 MED ORDER — DIPHENHYDRAMINE HCL 50 MG/ML IJ SOLN
25.0000 mg | Freq: Once | INTRAMUSCULAR | Status: AC
  Administered 2019-04-21: 25 mg via INTRAVENOUS

## 2019-04-21 MED ORDER — PALONOSETRON HCL INJECTION 0.25 MG/5ML
0.2500 mg | Freq: Once | INTRAVENOUS | Status: AC
  Administered 2019-04-21: 0.25 mg via INTRAVENOUS

## 2019-04-21 MED ORDER — SODIUM CHLORIDE 0.9 % IV SOLN
65.0000 mg/m2 | Freq: Once | INTRAVENOUS | Status: AC
  Administered 2019-04-21: 108 mg via INTRAVENOUS
  Filled 2019-04-21: qty 18

## 2019-04-21 MED ORDER — SODIUM CHLORIDE 0.9% FLUSH
10.0000 mL | INTRAVENOUS | Status: DC | PRN
  Administered 2019-04-21: 10 mL
  Filled 2019-04-21: qty 10

## 2019-04-21 MED ORDER — PALONOSETRON HCL INJECTION 0.25 MG/5ML
INTRAVENOUS | Status: AC
  Filled 2019-04-21: qty 5

## 2019-04-21 MED ORDER — SODIUM CHLORIDE 0.9 % IV SOLN
Freq: Once | INTRAVENOUS | Status: AC
  Filled 2019-04-21: qty 5

## 2019-04-21 MED ORDER — HEPARIN SOD (PORK) LOCK FLUSH 100 UNIT/ML IV SOLN
500.0000 [IU] | Freq: Once | INTRAVENOUS | Status: AC | PRN
  Administered 2019-04-21: 500 [IU]
  Filled 2019-04-21: qty 5

## 2019-04-21 MED ORDER — DIPHENHYDRAMINE HCL 50 MG/ML IJ SOLN
INTRAMUSCULAR | Status: AC
  Filled 2019-04-21: qty 1

## 2019-04-21 MED ORDER — SODIUM CHLORIDE 0.9 % IV SOLN
Freq: Once | INTRAVENOUS | Status: AC
  Filled 2019-04-21: qty 250

## 2019-04-21 NOTE — Patient Instructions (Signed)
[facility] Discharge Instructions for Patients Receiving Chemotherapy  Today you received the following chemotherapy agents:  Taxol.  To help prevent nausea and vomiting after your treatment, we encourage you to take your nausea medication as directed.   If you develop nausea and vomiting that is not controlled by your nausea medication, call the clinic.   BELOW ARE SYMPTOMS THAT SHOULD BE REPORTED IMMEDIATELY:  *FEVER GREATER THAN 100.5 F  *CHILLS WITH OR WITHOUT FEVER  NAUSEA AND VOMITING THAT IS NOT CONTROLLED WITH YOUR NAUSEA MEDICATION  *UNUSUAL SHORTNESS OF BREATH  *UNUSUAL BRUISING OR BLEEDING  TENDERNESS IN MOUTH AND THROAT WITH OR WITHOUT PRESENCE OF ULCERS  *URINARY PROBLEMS  *BOWEL PROBLEMS  UNUSUAL RASH Items with * indicate a potential emergency and should be followed up as soon as possible.  Feel free to call the clinic should you have any questions or concerns. The clinic phone number is [phone].  Please show the CHEMO ALERT CARD at check-in to the Emergency Department and triage nurse.   

## 2019-04-27 NOTE — Progress Notes (Signed)
Patient Care Team: [name], FNP as PCP - General (Family Medicine) Mauro Kaufmann, RN as Oncology Nurse Navigator Rockwell Germany, RN as Oncology Nurse Navigator  DIAGNOSIS:    ICD-10-CM   1. Malignant neoplasm of lower-outer quadrant of left breast of female, estrogen receptor negative ([place])  C50.512    Z17.1     SUMMARY OF ONCOLOGIC HISTORY: Oncology History  Malignant neoplasm of lower-outer quadrant of left breast of female, estrogen receptor negative ([place])  12/07/2018 Initial Diagnosis   Patient palpated a lump in the left breast. Mammogram showed a 1.4cm mass at the 5 o'clock position in the left breast, a left axillary lymph node measuring 2.8cm, and 2 smaller axillary lymph nodes with cortical thickening. 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   12/14/2018 Cancer Staging   Staging form: Breast, AJCC 8th Edition - Clinical: Stage IIB (cT1c, cN1, cM0, G3, ER-, PR-, HER2-) - Signed by Nicholas Lose, MD on 12/14/2018   12/23/2018 -  Chemotherapy   The patient had DOXOrubicin (ADRIAMYCIN) chemo injection 98 mg, 60 mg/m2 = 98 mg, Intravenous,  Once, 4 of 4 cycles Dose modification: 50 mg/m2 (original dose 60 mg/m2, Cycle 2, Reason: Dose not tolerated) Administration: 98 mg (12/23/2018), 82 mg (01/06/2019), 82 mg (01/20/2019), 82 mg (02/02/2019) palonosetron (ALOXI) injection 0.25 mg, 0.25 mg, Intravenous,  Once, 7 of 8 cycles Administration: 0.25 mg (12/23/2018), 0.25 mg (02/17/2019), 0.25 mg (01/06/2019), 0.25 mg (01/20/2019), 0.25 mg (02/02/2019), 0.25 mg (03/31/2019), 0.25 mg (04/21/2019) pegfilgrastim-jmdb (FULPHILA) injection 6 mg, 6 mg, Subcutaneous,  Once, 4 of 4 cycles Administration: 6 mg (12/26/2018), 6 mg (01/09/2019), 6 mg (01/23/2019), 6 mg (02/04/2019) CARBOplatin (PARAPLATIN) 570 mg in sodium chloride 0.9 % 250 mL chemo infusion, 570 mg (105.4 % of original dose 544.2 mg), Intravenous,  Once, 1 of 1 cycle Dose  modification:   (original dose 544.2 mg, Cycle 5) Administration: 570 mg (02/17/2019) cyclophosphamide (CYTOXAN) 980 mg in sodium chloride 0.9 % 250 mL chemo infusion, 600 mg/m2 = 980 mg, Intravenous,  Once, 4 of 4 cycles Dose modification: 500 mg/m2 (original dose 600 mg/m2, Cycle 2, Reason: Dose not tolerated) Administration: 980 mg (12/23/2018), 820 mg (01/06/2019), 820 mg (01/20/2019), 820 mg (02/02/2019) PACLitaxel (TAXOL) 132 mg in sodium chloride 0.9 % 250 mL chemo infusion (</= 48m/m2), 80 mg/m2 = 132 mg, Intravenous,  Once, 3 of 4 cycles Dose modification: 65 mg/m2 (original dose 80 mg/m2, Cycle 5, Reason: Provider Judgment), 50 mg/m2 (original dose 80 mg/m2, Cycle 6, Reason: Provider Judgment), 65 mg/m2 (original dose 80 mg/m2, Cycle 6, Reason: Provider Judgment) Administration: 132 mg (02/17/2019), 108 mg (02/24/2019), 84 mg (03/31/2019), 84 mg (04/07/2019), 108 mg (04/14/2019), 108 mg (04/21/2019), 84 mg (03/24/2019) fosaprepitant (EMEND) 150 mg, dexamethasone (DECADRON) 12 mg in sodium chloride 0.9 % 145 mL IVPB, , Intravenous,  Once, 7 of 8 cycles Administration:  (12/23/2018),  (02/17/2019),  (01/06/2019),  (01/20/2019),  (02/02/2019),  (03/31/2019),  (04/21/2019)  for chemotherapy treatment.      CHIEF COMPLIANT: Cycle8Taxol  INTERVAL HISTORY: Heather Hebert a 59y.o. 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 She is currently receiving neoadjuvant chemotherapywith weekly Taxol after completing 4 cycles ofdose dense Adriamycin and Cytoxan.She presents to the clinic todayforcycle8.  She denies any neuropathy.  Denies any nausea or vomiting.  She gets insomnia after dexamethasone infusion.  She takes Ativan.  ALLERGIES:  has No Known Allergies.  MEDICATIONS:  Current Outpatient Medications  Medication Sig Dispense Refill  . Calcium Carb-Cholecalciferol (CALCIUM 600 + D PO) Take 1 tablet by mouth 2 (two) times daily.    . cetirizine (ZYRTEC) 10 MG tablet  Take 10 mg by mouth daily.    . Flax OIL Take by mouth. One cap per day    . fluocinolone (SYNALAR) 0.025 % cream Apply topically 3 (three) times daily. 120 g 1  . lidocaine-prilocaine (EMLA) cream Apply to affected area once 30 g 3  . LORazepam (ATIVAN) 0.5 MG tablet Take 1 tablet (0.5 mg total) by mouth at bedtime as needed for sleep. 30 tablet 0  . Multiple Vitamin (MULTIVITAMIN WITH MINERALS) TABS tablet Take 1 tablet by mouth daily.    . Olopatadine HCl (PATADAY OP) Place 1 drop into both eyes daily as needed. For dry eyes    . ondansetron (ZOFRAN) 8 MG tablet Take 1 tablet (8 mg total) by mouth 2 (two) times daily as needed. Start on the third day after chemotherapy. 30 tablet 1  . Probiotic Product (PROBIOTIC PO) Take 1 tablet by mouth every other day.     . prochlorperazine (COMPAZINE) 10 MG tablet Take 1 tablet (10 mg total) by mouth every 6 (six) hours as needed (Nausea or vomiting). 30 tablet 1   No current facility-administered medications for this visit.    PHYSICAL EXAMINATION: ECOG PERFORMANCE STATUS: 1 - Symptomatic but completely ambulatory  Vitals:   04/28/19 1042  BP: 120/64  Pulse: 94  Resp: 18  Temp: 98.4 F (36.9 C)  SpO2: 100%   Filed Weights   04/28/19 1042  Weight: 130 lb 4.8 oz (59.1 kg)     LABORATORY DATA:  I have reviewed the data as listed CMP Latest Ref Rng & Units 04/21/2019 04/14/2019 04/07/2019  Glucose 70 - 99 mg/dL 87 90 113(H)  BUN 6 - 20 mg/dL '9 10 9  ' Creatinine 0.44 - 1.00 mg/dL 0.67 0.64 0.68  Sodium 135 - 145 mmol/L 142 140 141  Potassium 3.5 - 5.1 mmol/L 3.9 3.9 3.7  Chloride 98 - 111 mmol/L 105 105 104  CO2 22 - 32 mmol/L '28 28 27  ' Calcium 8.9 - 10.3 mg/dL 9.2 9.0 9.0  Total Protein 6.5 - 8.1 g/dL 6.4(L) 6.3(L) 6.5  Total Bilirubin 0.3 - 1.2 mg/dL 0.3 0.2(L) 0.3  Alkaline Phos 38 - 126 U/L 96 92 92  AST 15 - 41 U/L '22 19 20  ' ALT 0 - 44 U/L '13 13 14    ' Lab Results  Component Value Date   WBC 4.8 04/28/2019   HGB 10.4 (L)  04/28/2019   HCT 31.6 (L) 04/28/2019   MCV 102.9 (H) 04/28/2019   PLT 198 04/28/2019   NEUTROABS 3.6 04/28/2019    ASSESSMENT & PLAN:  Malignant neoplasm of lower-outer quadrant of left breast of female, estrogen receptor negative ([facility]) 12/07/2018:Patient palpated a lump in the left breast. Mammogram showed a 1.4cm mass at the 5 o'clock position in the left breast, a left axillary lymph node measuring 2.8cm, and 2 smaller axillary lymph nodes with cortical thickening. 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 Neoadjuvant chemotherapy with Adriamycin and Cytoxan dose dense 4 followed byTaxolweekly 12with carboplatin every 3 weeks--carbo eliminated due to cytopenias 2. Followed by breast conserving surgery withaxillary lymph nodedissection(6 lymph nodes were found on MRI) 3. Followed by adjuvant radiation therapy  Breast MRI on 12/21/18  showed the known 1.5cm mass in the left breast, at least 6 abnormal left axillary lymph nodes, and no right breast malignancy -------------------------------------------------------------------------------------------------------------------------------------------------- Current treatment:Completed 4 cycles ofdose dense Adriamycin and Cytoxan,today [id number] Echocardiogram: 12/21/2018: EF 60 to 65%  Chemo toxicities: 1.Severe cytopenias: Carboplatin discontinued. Primarily due to thrombocytopenia.Monitoring closely. No clinical signs symptoms of neuropathy. 2.Leukopenia/neutropenia: We are watching this very closely.Today's ANC is 3.6 3.Chemotherapy-induced anemia: Slowly improving.Hemoglobin is 10.4   We  increased the dosage of Taxol to 65 mg per metered squared with cycle 6. I ordered a breast MRI to be done after the 12th cycle of chemo. I will see her with a virtual visit after the MRI.  Return to clinic weekly for Taxol every other week  for follow-up with me.     No orders of the defined types were placed in this encounter.  The patient has a good understanding of the overall plan. she agrees with it. she will call with any problems that may develop before the next visit here.  Total time spent: 30 mins including face to face time and time spent for planning, charting and coordination of care  Nicholas Lose, MD 04/28/2019  I, Cloyde Reams Dorshimer, am acting as scribe for Dr. Nicholas Lose.  I have reviewed the above documentation for accuracy and completeness, and I agree with the above.

## 2019-04-28 ENCOUNTER — Other Ambulatory Visit: Payer: Self-pay | Admitting: Hematology and Oncology

## 2019-04-28 ENCOUNTER — Ambulatory Visit: Payer: Commercial Managed Care - PPO

## 2019-04-28 ENCOUNTER — Inpatient Hospital Stay: Payer: Commercial Managed Care - PPO

## 2019-04-28 ENCOUNTER — Other Ambulatory Visit: Payer: Self-pay

## 2019-04-28 ENCOUNTER — Other Ambulatory Visit: Payer: Commercial Managed Care - PPO

## 2019-04-28 ENCOUNTER — Inpatient Hospital Stay (HOSPITAL_BASED_OUTPATIENT_CLINIC_OR_DEPARTMENT_OTHER): Payer: Commercial Managed Care - PPO | Admitting: Hematology and Oncology

## 2019-04-28 DIAGNOSIS — Z853 Personal history of malignant neoplasm of breast: Secondary | ICD-10-CM

## 2019-04-28 DIAGNOSIS — Z5111 Encounter for antineoplastic chemotherapy: Secondary | ICD-10-CM | POA: Diagnosis not present

## 2019-04-28 DIAGNOSIS — C50512 Malignant neoplasm of lower-outer quadrant of left female breast: Secondary | ICD-10-CM

## 2019-04-28 DIAGNOSIS — Z171 Estrogen receptor negative status [ER-]: Secondary | ICD-10-CM

## 2019-04-28 DIAGNOSIS — Z95828 Presence of other vascular implants and grafts: Secondary | ICD-10-CM

## 2019-04-28 LAB — CBC WITH DIFFERENTIAL (CANCER CENTER ONLY)
Abs Immature Granulocytes: 0.05 10*3/uL (ref 0.00–0.07)
Basophils Absolute: 0 10*3/uL (ref 0.0–0.1)
Basophils Relative: 1 %
Eosinophils Absolute: 0.1 10*3/uL (ref 0.0–0.5)
Eosinophils Relative: 2 %
HCT: 31.6 % — ABNORMAL LOW (ref 36.0–46.0)
Hemoglobin: 10.4 g/dL — ABNORMAL LOW (ref 12.0–15.0)
Immature Granulocytes: 1 %
Lymphocytes Relative: 11 %
Lymphs Abs: 0.5 10*3/uL — ABNORMAL LOW (ref 0.7–4.0)
MCH: 33.9 pg (ref 26.0–34.0)
MCHC: 32.9 g/dL (ref 30.0–36.0)
MCV: 102.9 fL — ABNORMAL HIGH (ref 80.0–100.0)
Monocytes Absolute: 0.5 10*3/uL (ref 0.1–1.0)
Monocytes Relative: 10 %
Neutro Abs: 3.6 10*3/uL (ref 1.7–7.7)
Neutrophils Relative %: 75 %
Platelet Count: 198 10*3/uL (ref 150–400)
RBC: 3.07 MIL/uL — ABNORMAL LOW (ref 3.87–5.11)
RDW: 15.6 % — ABNORMAL HIGH (ref 11.5–15.5)
WBC Count: 4.8 10*3/uL (ref 4.0–10.5)
nRBC: 0 % (ref 0.0–0.2)

## 2019-04-28 LAB — CMP (CANCER CENTER ONLY)
ALT: 13 U/L (ref 0–44)
AST: 17 U/L (ref 15–41)
Albumin: 3.7 g/dL (ref 3.5–5.0)
Alkaline Phosphatase: 96 U/L (ref 38–126)
Anion gap: 7 (ref 5–15)
BUN: 9 mg/dL (ref 6–20)
CO2: 27 mmol/L (ref 22–32)
Calcium: 9.1 mg/dL (ref 8.9–10.3)
Chloride: 103 mmol/L (ref 98–111)
Creatinine: 0.68 mg/dL (ref 0.44–1.00)
GFR, Est AFR Am: >60 mL/min (ref >60)
GFR, Estimated: >60 mL/min (ref >60)
Glucose, Bld: 105 mg/dL — ABNORMAL HIGH (ref 70–99)
Potassium: 3.8 mmol/L (ref 3.5–5.1)
Sodium: 137 mmol/L (ref 135–145)
Total Bilirubin: 0.3 mg/dL (ref 0.3–1.2)
Total Protein: 6.4 g/dL — ABNORMAL LOW (ref 6.5–8.1)

## 2019-04-28 MED ORDER — SODIUM CHLORIDE 0.9% FLUSH
10.0000 mL | INTRAVENOUS | Status: DC | PRN
  Administered 2019-04-28: 10 mL
  Filled 2019-04-28: qty 10

## 2019-04-28 MED ORDER — SODIUM CHLORIDE 0.9 % IV SOLN: 10.0000 mg | Freq: Once | INTRAVENOUS | Status: DC

## 2019-04-28 MED ORDER — DIPHENHYDRAMINE HCL 50 MG/ML IJ SOLN
INTRAMUSCULAR | Status: AC
  Filled 2019-04-28: qty 1

## 2019-04-28 MED ORDER — DEXAMETHASONE SODIUM PHOSPHATE 10 MG/ML IJ SOLN
10.0000 mg | Freq: Once | INTRAMUSCULAR | Status: AC
  Administered 2019-04-28: 10 mg via INTRAVENOUS

## 2019-04-28 MED ORDER — DEXAMETHASONE SODIUM PHOSPHATE 10 MG/ML IJ SOLN
INTRAMUSCULAR | Status: AC
  Filled 2019-04-28: qty 1

## 2019-04-28 MED ORDER — FAMOTIDINE IN NACL 20-0.9 MG/50ML-% IV SOLN
20.0000 mg | Freq: Once | INTRAVENOUS | Status: AC
  Administered 2019-04-28: 20 mg via INTRAVENOUS

## 2019-04-28 MED ORDER — HEPARIN SOD (PORK) LOCK FLUSH 100 UNIT/ML IV SOLN
500.0000 [IU] | Freq: Once | INTRAVENOUS | Status: AC | PRN
  Administered 2019-04-28: 500 [IU]
  Filled 2019-04-28: qty 5

## 2019-04-28 MED ORDER — SODIUM CHLORIDE 0.9 % IV SOLN
Freq: Once | INTRAVENOUS | Status: AC
  Filled 2019-04-28: qty 250

## 2019-04-28 MED ORDER — DIPHENHYDRAMINE HCL 50 MG/ML IJ SOLN
25.0000 mg | Freq: Once | INTRAMUSCULAR | Status: AC
  Administered 2019-04-28: 25 mg via INTRAVENOUS

## 2019-04-28 MED ORDER — SODIUM CHLORIDE 0.9 % IV SOLN
65.0000 mg/m2 | Freq: Once | INTRAVENOUS | Status: AC
  Administered 2019-04-28: 108 mg via INTRAVENOUS
  Filled 2019-04-28: qty 18

## 2019-04-28 MED ORDER — FAMOTIDINE IN NACL 20-0.9 MG/50ML-% IV SOLN
INTRAVENOUS | Status: AC
  Filled 2019-04-28: qty 50

## 2019-04-28 NOTE — Patient Instructions (Signed)
[facility] Discharge Instructions for Patients Receiving Chemotherapy  Today you received the following chemotherapy agents:  Taxol.  To help prevent nausea and vomiting after your treatment, we encourage you to take your nausea medication as directed.   If you develop nausea and vomiting that is not controlled by your nausea medication, call the clinic.   BELOW ARE SYMPTOMS THAT SHOULD BE REPORTED IMMEDIATELY:  *FEVER GREATER THAN 100.5 F  *CHILLS WITH OR WITHOUT FEVER  NAUSEA AND VOMITING THAT IS NOT CONTROLLED WITH YOUR NAUSEA MEDICATION  *UNUSUAL SHORTNESS OF BREATH  *UNUSUAL BRUISING OR BLEEDING  TENDERNESS IN MOUTH AND THROAT WITH OR WITHOUT PRESENCE OF ULCERS  *URINARY PROBLEMS  *BOWEL PROBLEMS  UNUSUAL RASH Items with * indicate a potential emergency and should be followed up as soon as possible.  Feel free to call the clinic should you have any questions or concerns. The clinic phone number is [phone].  Please show the CHEMO ALERT CARD at check-in to the Emergency Department and triage nurse.   

## 2019-04-28 NOTE — Assessment & Plan Note (Signed)
12/07/2018:Patient palpated a lump in the left breast. Mammogram showed a 1.4cm mass at the 5 o'clock position in the left breast, a left axillary lymph node measuring 2.8cm, and 2 smaller axillary lymph nodes with cortical thickening. 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 Neoadjuvant chemotherapy with Adriamycin and Cytoxan dose dense 4 followed byTaxolweekly 12with carboplatin every 3 weeks--carbo eliminated due to cytopenias 2. Followed by breast conserving surgery withaxillary lymph nodedissection(6 lymph nodes were found on MRI) 3. Followed by adjuvant radiation therapy  Breast MRI on 12/21/18 showed the known 1.5cm mass in the left breast, at least 6 abnormal left axillary lymph nodes, and no right breast malignancy -------------------------------------------------------------------------------------------------------------------------------------------------- Current treatment:Completed 4 cycles ofdose dense Adriamycin and Cytoxan,today [id number] Echocardiogram: 12/21/2018: EF 60 to 65%  Chemo toxicities: 1.Severe cytopenias: Carboplatin discontinued. Primarily due to thrombocytopenia.Monitoring closely. No clinical signs symptoms of neuropathy. 2.Leukopenia/neutropenia: We are watching this very closely.Today's ANC is  3.Chemotherapy-induced anemia: Slowly improving.Hemoglobin is    We  increased the dosage of Taxol to 65 mg per metered squared with cycle 6. Return to clinic weekly for Taxol every other week for follow-up with me.

## 2019-05-01 NOTE — Progress Notes (Signed)
Pharmacist Chemotherapy Monitoring - Follow Up Assessment    I verify that I have reviewed each item in the below checklist:  . Regimen for the patient is scheduled for the appropriate day and plan matches scheduled date. Marland Kitchen Appropriate non-routine labs are ordered dependent on drug ordered. . If applicable, additional medications reviewed and ordered per protocol based on lifetime cumulative doses and/or treatment regimen.   Plan for follow-up and/or issues identified: No . I-vent associated with next due treatment: No . MD and/or nursing notified: No  Philomena Course 05/01/2019 11:14 AM

## 2019-05-05 ENCOUNTER — Ambulatory Visit: Payer: Commercial Managed Care - PPO

## 2019-05-05 ENCOUNTER — Ambulatory Visit: Payer: Commercial Managed Care - PPO | Admitting: Hematology and Oncology

## 2019-05-05 ENCOUNTER — Inpatient Hospital Stay: Payer: Commercial Managed Care - PPO

## 2019-05-05 ENCOUNTER — Other Ambulatory Visit: Payer: Self-pay

## 2019-05-05 VITALS — BP 123/69 | HR 71 | Temp 98.3°F | Resp 16

## 2019-05-05 DIAGNOSIS — C50512 Malignant neoplasm of lower-outer quadrant of left female breast: Secondary | ICD-10-CM

## 2019-05-05 DIAGNOSIS — Z5111 Encounter for antineoplastic chemotherapy: Secondary | ICD-10-CM | POA: Diagnosis not present

## 2019-05-05 DIAGNOSIS — Z171 Estrogen receptor negative status [ER-]: Secondary | ICD-10-CM

## 2019-05-05 DIAGNOSIS — Z95828 Presence of other vascular implants and grafts: Secondary | ICD-10-CM

## 2019-05-05 LAB — CBC WITH DIFFERENTIAL (CANCER CENTER ONLY)
Abs Immature Granulocytes: 0.04 10*3/uL (ref 0.00–0.07)
Basophils Absolute: 0 10*3/uL (ref 0.0–0.1)
Basophils Relative: 1 %
Eosinophils Absolute: 0.1 10*3/uL (ref 0.0–0.5)
Eosinophils Relative: 3 %
HCT: 31.6 % — ABNORMAL LOW (ref 36.0–46.0)
Hemoglobin: 10.4 g/dL — ABNORMAL LOW (ref 12.0–15.0)
Immature Granulocytes: 1 %
Lymphocytes Relative: 18 %
Lymphs Abs: 0.6 10*3/uL — ABNORMAL LOW (ref 0.7–4.0)
MCH: 33.3 pg (ref 26.0–34.0)
MCHC: 32.9 g/dL (ref 30.0–36.0)
MCV: 101.3 fL — ABNORMAL HIGH (ref 80.0–100.0)
Monocytes Absolute: 0.5 10*3/uL (ref 0.1–1.0)
Monocytes Relative: 16 %
Neutro Abs: 2 10*3/uL (ref 1.7–7.7)
Neutrophils Relative %: 61 %
Platelet Count: 188 10*3/uL (ref 150–400)
RBC: 3.12 MIL/uL — ABNORMAL LOW (ref 3.87–5.11)
RDW: 14.8 % (ref 11.5–15.5)
WBC Count: 3.3 10*3/uL — ABNORMAL LOW (ref 4.0–10.5)
nRBC: 0 % (ref 0.0–0.2)

## 2019-05-05 LAB — CMP (CANCER CENTER ONLY)
ALT: 13 U/L (ref 0–44)
AST: 32 U/L (ref 15–41)
Albumin: 3.6 g/dL (ref 3.5–5.0)
Alkaline Phosphatase: 93 U/L (ref 38–126)
Anion gap: 7 (ref 5–15)
BUN: 9 mg/dL (ref 6–20)
CO2: 26 mmol/L (ref 22–32)
Calcium: 9.2 mg/dL (ref 8.9–10.3)
Chloride: 105 mmol/L (ref 98–111)
Creatinine: 0.66 mg/dL (ref 0.44–1.00)
GFR, Est AFR Am: >60 mL/min (ref >60)
GFR, Estimated: >60 mL/min (ref >60)
Glucose, Bld: 94 mg/dL (ref 70–99)
Potassium: 3.9 mmol/L (ref 3.5–5.1)
Sodium: 138 mmol/L (ref 135–145)
Total Bilirubin: 0.3 mg/dL (ref 0.3–1.2)
Total Protein: 6.6 g/dL (ref 6.5–8.1)

## 2019-05-05 MED ORDER — DEXAMETHASONE SODIUM PHOSPHATE 10 MG/ML IJ SOLN
10.0000 mg | Freq: Once | INTRAMUSCULAR | Status: AC
  Administered 2019-05-05: 10 mg via INTRAVENOUS

## 2019-05-05 MED ORDER — SODIUM CHLORIDE 0.9% FLUSH
10.0000 mL | INTRAVENOUS | Status: DC | PRN
  Filled 2019-05-05: qty 10

## 2019-05-05 MED ORDER — FAMOTIDINE IN NACL 20-0.9 MG/50ML-% IV SOLN
20.0000 mg | Freq: Once | INTRAVENOUS | Status: AC
  Administered 2019-05-05: 20 mg via INTRAVENOUS

## 2019-05-05 MED ORDER — SODIUM CHLORIDE 0.9% FLUSH
10.0000 mL | INTRAVENOUS | Status: DC | PRN
  Administered 2019-05-05 (×2): 10 mL
  Filled 2019-05-05: qty 10

## 2019-05-05 MED ORDER — SODIUM CHLORIDE 0.9 % IV SOLN
65.0000 mg/m2 | Freq: Once | INTRAVENOUS | Status: AC
  Administered 2019-05-05: 108 mg via INTRAVENOUS
  Filled 2019-05-05: qty 18

## 2019-05-05 MED ORDER — DEXAMETHASONE SODIUM PHOSPHATE 10 MG/ML IJ SOLN
INTRAMUSCULAR | Status: AC
  Filled 2019-05-05: qty 1

## 2019-05-05 MED ORDER — SODIUM CHLORIDE 0.9 % IV SOLN
Freq: Once | INTRAVENOUS | Status: AC
  Filled 2019-05-05: qty 250

## 2019-05-05 MED ORDER — DIPHENHYDRAMINE HCL 50 MG/ML IJ SOLN
INTRAMUSCULAR | Status: AC
  Filled 2019-05-05: qty 1

## 2019-05-05 MED ORDER — FAMOTIDINE IN NACL 20-0.9 MG/50ML-% IV SOLN
INTRAVENOUS | Status: AC
  Filled 2019-05-05: qty 50

## 2019-05-05 MED ORDER — DIPHENHYDRAMINE HCL 50 MG/ML IJ SOLN
25.0000 mg | Freq: Once | INTRAMUSCULAR | Status: AC
  Administered 2019-05-05: 25 mg via INTRAVENOUS

## 2019-05-05 MED ORDER — HEPARIN SOD (PORK) LOCK FLUSH 100 UNIT/ML IV SOLN
500.0000 [IU] | Freq: Once | INTRAVENOUS | Status: AC | PRN
  Administered 2019-05-05: 500 [IU]
  Filled 2019-05-05: qty 5

## 2019-05-05 NOTE — Patient Instructions (Signed)

## 2019-05-05 NOTE — Patient Instructions (Signed)
[facility] Discharge Instructions for Patients Receiving Chemotherapy  Today you received the following chemotherapy agents: paclitaxel.  To help prevent nausea and vomiting after your treatment, we encourage you to take your nausea medication as directed.   If you develop nausea and vomiting that is not controlled by your nausea medication, call the clinic.   BELOW ARE SYMPTOMS THAT SHOULD BE REPORTED IMMEDIATELY:  *FEVER GREATER THAN 100.5 F  *CHILLS WITH OR WITHOUT FEVER  NAUSEA AND VOMITING THAT IS NOT CONTROLLED WITH YOUR NAUSEA MEDICATION  *UNUSUAL SHORTNESS OF BREATH  *UNUSUAL BRUISING OR BLEEDING  TENDERNESS IN MOUTH AND THROAT WITH OR WITHOUT PRESENCE OF ULCERS  *URINARY PROBLEMS  *BOWEL PROBLEMS  UNUSUAL RASH Items with * indicate a potential emergency and should be followed up as soon as possible.  Feel free to call the clinic should you have any questions or concerns. The clinic phone number is [phone].  Please show the CHEMO ALERT CARD at check-in to the Emergency Department and triage nurse.   

## 2019-05-06 ENCOUNTER — Encounter: Payer: Self-pay | Admitting: *Deleted

## 2019-05-08 NOTE — Progress Notes (Addendum)
Pharmacist Chemotherapy Monitoring - Follow Up Assessment    I verify that I have reviewed each item in the below checklist:  . Regimen for the patient is scheduled for the appropriate day and plan matches scheduled date. Marland Kitchen Appropriate non-routine labs are ordered dependent on drug ordered. . If applicable, additional medications reviewed and ordered per protocol based on lifetime cumulative doses and/or treatment regimen.   Plan for follow-up and/or issues identified: Yes . I-vent associated with next due treatment: Yes . MD and/or nursing notified: No  Britt Boozer 05/08/2019 1:58 PM

## 2019-05-11 ENCOUNTER — Encounter: Payer: Self-pay | Admitting: *Deleted

## 2019-05-12 ENCOUNTER — Inpatient Hospital Stay: Payer: Commercial Managed Care - PPO | Attending: Hematology and Oncology

## 2019-05-12 ENCOUNTER — Inpatient Hospital Stay: Payer: Commercial Managed Care - PPO

## 2019-05-12 ENCOUNTER — Other Ambulatory Visit: Payer: Self-pay

## 2019-05-12 VITALS — BP 139/74 | HR 89 | Temp 98.3°F | Resp 17 | Wt 132.5 lb

## 2019-05-12 DIAGNOSIS — Z171 Estrogen receptor negative status [ER-]: Secondary | ICD-10-CM | POA: Diagnosis not present

## 2019-05-12 DIAGNOSIS — Z5111 Encounter for antineoplastic chemotherapy: Secondary | ICD-10-CM | POA: Insufficient documentation

## 2019-05-12 DIAGNOSIS — T451X5A Adverse effect of antineoplastic and immunosuppressive drugs, initial encounter: Secondary | ICD-10-CM | POA: Diagnosis not present

## 2019-05-12 DIAGNOSIS — Z95828 Presence of other vascular implants and grafts: Secondary | ICD-10-CM

## 2019-05-12 DIAGNOSIS — C773 Secondary and unspecified malignant neoplasm of axilla and upper limb lymph nodes: Secondary | ICD-10-CM | POA: Insufficient documentation

## 2019-05-12 DIAGNOSIS — D6481 Anemia due to antineoplastic chemotherapy: Secondary | ICD-10-CM | POA: Insufficient documentation

## 2019-05-12 DIAGNOSIS — D696 Thrombocytopenia, unspecified: Secondary | ICD-10-CM | POA: Insufficient documentation

## 2019-05-12 DIAGNOSIS — Z79899 Other long term (current) drug therapy: Secondary | ICD-10-CM | POA: Diagnosis not present

## 2019-05-12 DIAGNOSIS — C50512 Malignant neoplasm of lower-outer quadrant of left female breast: Secondary | ICD-10-CM

## 2019-05-12 LAB — CMP (CANCER CENTER ONLY)
ALT: 14 U/L (ref 0–44)
AST: 23 U/L (ref 15–41)
Albumin: 3.7 g/dL (ref 3.5–5.0)
Alkaline Phosphatase: 100 U/L (ref 38–126)
Anion gap: 10 (ref 5–15)
BUN: 9 mg/dL (ref 6–20)
CO2: 26 mmol/L (ref 22–32)
Calcium: 9.3 mg/dL (ref 8.9–10.3)
Chloride: 104 mmol/L (ref 98–111)
Creatinine: 0.67 mg/dL (ref 0.44–1.00)
GFR, Est AFR Am: >60 mL/min (ref >60)
GFR, Estimated: >60 mL/min (ref >60)
Glucose, Bld: 109 mg/dL — ABNORMAL HIGH (ref 70–99)
Potassium: 3.9 mmol/L (ref 3.5–5.1)
Sodium: 140 mmol/L (ref 135–145)
Total Bilirubin: 0.2 mg/dL — ABNORMAL LOW (ref 0.3–1.2)
Total Protein: 6.7 g/dL (ref 6.5–8.1)

## 2019-05-12 LAB — CBC WITH DIFFERENTIAL (CANCER CENTER ONLY)
Abs Immature Granulocytes: 0.03 10*3/uL (ref 0.00–0.07)
Basophils Absolute: 0 10*3/uL (ref 0.0–0.1)
Basophils Relative: 1 %
Eosinophils Absolute: 0.1 10*3/uL (ref 0.0–0.5)
Eosinophils Relative: 2 %
HCT: 32.4 % — ABNORMAL LOW (ref 36.0–46.0)
Hemoglobin: 10.5 g/dL — ABNORMAL LOW (ref 12.0–15.0)
Immature Granulocytes: 1 %
Lymphocytes Relative: 16 %
Lymphs Abs: 0.6 10*3/uL — ABNORMAL LOW (ref 0.7–4.0)
MCH: 32.9 pg (ref 26.0–34.0)
MCHC: 32.4 g/dL (ref 30.0–36.0)
MCV: 101.6 fL — ABNORMAL HIGH (ref 80.0–100.0)
Monocytes Absolute: 0.5 10*3/uL (ref 0.1–1.0)
Monocytes Relative: 14 %
Neutro Abs: 2.4 10*3/uL (ref 1.7–7.7)
Neutrophils Relative %: 66 %
Platelet Count: 174 10*3/uL (ref 150–400)
RBC: 3.19 MIL/uL — ABNORMAL LOW (ref 3.87–5.11)
RDW: 14.7 % (ref 11.5–15.5)
WBC Count: 3.6 10*3/uL — ABNORMAL LOW (ref 4.0–10.5)
nRBC: 0 % (ref 0.0–0.2)

## 2019-05-12 MED ORDER — SODIUM CHLORIDE 0.9 % IV SOLN
65.0000 mg/m2 | Freq: Once | INTRAVENOUS | Status: AC
  Administered 2019-05-12: 108 mg via INTRAVENOUS
  Filled 2019-05-12: qty 18

## 2019-05-12 MED ORDER — DEXAMETHASONE SODIUM PHOSPHATE 10 MG/ML IJ SOLN
10.0000 mg | Freq: Once | INTRAMUSCULAR | Status: AC
  Administered 2019-05-12: 10 mg via INTRAVENOUS

## 2019-05-12 MED ORDER — HEPARIN SOD (PORK) LOCK FLUSH 100 UNIT/ML IV SOLN
500.0000 [IU] | Freq: Once | INTRAVENOUS | Status: AC | PRN
  Administered 2019-05-12: 500 [IU]
  Filled 2019-05-12: qty 5

## 2019-05-12 MED ORDER — SODIUM CHLORIDE 0.9 % IV SOLN
Freq: Once | INTRAVENOUS | Status: AC
  Filled 2019-05-12: qty 250

## 2019-05-12 MED ORDER — SODIUM CHLORIDE 0.9% FLUSH
10.0000 mL | INTRAVENOUS | Status: DC | PRN
  Administered 2019-05-12: 10 mL
  Filled 2019-05-12: qty 10

## 2019-05-12 MED ORDER — DIPHENHYDRAMINE HCL 50 MG/ML IJ SOLN
25.0000 mg | Freq: Once | INTRAMUSCULAR | Status: AC
  Administered 2019-05-12: 25 mg via INTRAVENOUS

## 2019-05-12 MED ORDER — FAMOTIDINE IN NACL 20-0.9 MG/50ML-% IV SOLN
20.0000 mg | Freq: Once | INTRAVENOUS | Status: AC
  Administered 2019-05-12: 20 mg via INTRAVENOUS

## 2019-05-12 MED ORDER — FAMOTIDINE IN NACL 20-0.9 MG/50ML-% IV SOLN
INTRAVENOUS | Status: AC
  Filled 2019-05-12: qty 50

## 2019-05-12 MED ORDER — DIPHENHYDRAMINE HCL 50 MG/ML IJ SOLN
INTRAMUSCULAR | Status: AC
  Filled 2019-05-12: qty 1

## 2019-05-12 MED ORDER — DEXAMETHASONE SODIUM PHOSPHATE 10 MG/ML IJ SOLN
INTRAMUSCULAR | Status: AC
  Filled 2019-05-12: qty 1

## 2019-05-12 NOTE — Patient Instructions (Signed)
[facility] Discharge Instructions for Patients Receiving Chemotherapy  Today you received the following chemotherapy agent: Paclitaxel (Taxol)  To help prevent nausea and vomiting after your treatment, we encourage you to take your nausea medication as directed by your MD.   If you develop nausea and vomiting that is not controlled by your nausea medication, call the clinic.   BELOW ARE SYMPTOMS THAT SHOULD BE REPORTED IMMEDIATELY:  *FEVER GREATER THAN 100.5 F  *CHILLS WITH OR WITHOUT FEVER  NAUSEA AND VOMITING THAT IS NOT CONTROLLED WITH YOUR NAUSEA MEDICATION  *UNUSUAL SHORTNESS OF BREATH  *UNUSUAL BRUISING OR BLEEDING  TENDERNESS IN MOUTH AND THROAT WITH OR WITHOUT PRESENCE OF ULCERS  *URINARY PROBLEMS  *BOWEL PROBLEMS  UNUSUAL RASH Items with * indicate a potential emergency and should be followed up as soon as possible.  Feel free to call the clinic should you have any questions or concerns. The clinic phone number is [phone].  Please show the [place] at check-in to the Emergency Department and triage nurse.  Coronavirus (COVID-19) Are you at risk?  Are you at risk for the Coronavirus (COVID-19)?  To be considered HIGH RISK for Coronavirus (COVID-19), you have to meet the following criteria:  . Traveled to [place], [place], [place], [place] or [place]; or in the [place] to [place], [place], [place], or [place]; and have fever, cough, and shortness of breath within the last 2 weeks of travel OR . Been in close contact with a person diagnosed with COVID-19 within the last 2 weeks and have fever, cough, and shortness of breath . IF YOU DO NOT MEET THESE CRITERIA, YOU ARE CONSIDERED LOW RISK FOR COVID-19.  What to do if you are HIGH RISK for COVID-19?  Marland Kitchen If you are having a medical emergency, call 911. . Seek medical care right away. Before you go to a doctor's office, urgent care or emergency department, call ahead and tell  them about your recent travel, contact with someone diagnosed with COVID-19, and your symptoms. You should receive instructions from your physician's office regarding next steps of care.  . When you arrive at healthcare provider, tell the healthcare staff immediately you have returned from visiting [place], [place], [place], [place] or [place]; or traveled in the [place] to [place], [place], [place], or [place]; in the last two weeks or you have been in close contact with a person diagnosed with COVID-19 in the last 2 weeks.   . Tell the health care staff about your symptoms: fever, cough and shortness of breath. . After you have been seen by a medical provider, you will be either: o Tested for (COVID-19) and discharged home on quarantine except to seek medical care if symptoms worsen, and asked to  - Stay home and avoid contact with others until you get your results (4-5 days)  - Avoid travel on public transportation if possible (such as bus, train, or airplane) or o Sent to the Emergency Department by EMS for evaluation, COVID-19 testing, and possible admission depending on your condition and test results.  What to do if you are LOW RISK for COVID-19?  Reduce your risk of any infection by using the same precautions used for avoiding the common cold or flu:  Marland Kitchen Wash your hands often with soap and warm water for at least 20 seconds.  If soap and water are not readily available, use an alcohol-based hand sanitizer with at least 60% alcohol.  Marland Kitchen  If coughing or sneezing, cover your mouth and nose by coughing or sneezing into the elbow areas of your shirt or coat, into a tissue or into your sleeve (not your hands). . Avoid shaking hands with others and consider head nods or verbal greetings only. . Avoid touching your eyes, nose, or mouth with unwashed hands.  . Avoid close contact with people who are sick. . Avoid places or events with large numbers of people in one location, like concerts or  sporting events. . Carefully consider travel plans you have or are making. . If you are planning any travel outside or inside the [place], visit the CDC's Travelers' Health webpage for the latest health notices. . If you have some symptoms but not all symptoms, continue to monitor at home and seek medical attention if your symptoms worsen. . If you are having a medical emergency, call 911.   [place] / e-Visit: [email or web address]         [facility]: [facility]: [phone]                   [facility]: [phone]

## 2019-05-16 NOTE — Progress Notes (Signed)
Pharmacist Chemotherapy Monitoring - Follow Up Assessment    I verify that I have reviewed each item in the below checklist:  . Regimen for the patient is scheduled for the appropriate day and plan matches scheduled date. Marland Kitchen Appropriate non-routine labs are ordered dependent on drug ordered. . If applicable, additional medications reviewed and ordered per protocol based on lifetime cumulative doses and/or treatment regimen.   Plan for follow-up and/or issues identified: No . I-vent associated with next due treatment: No . MD and/or nursing notified: No  Siddh Vandeventer D 05/16/2019 4:27 PM

## 2019-05-17 ENCOUNTER — Encounter: Payer: Self-pay | Admitting: *Deleted

## 2019-05-18 ENCOUNTER — Ambulatory Visit
Admission: RE | Admit: 2019-05-18 | Discharge: 2019-05-18 | Disposition: A | Discharge status: Home / Self Care | Payer: Commercial Managed Care - PPO | Source: Ambulatory Visit | Attending: Hematology and Oncology | Admitting: Hematology and Oncology

## 2019-05-18 ENCOUNTER — Other Ambulatory Visit: Payer: Self-pay

## 2019-05-18 DIAGNOSIS — Z853 Personal history of malignant neoplasm of breast: Secondary | ICD-10-CM

## 2019-05-18 NOTE — Progress Notes (Signed)
Patient Care Team: [name], FNP as PCP - General (Family Medicine) Mauro Kaufmann, RN as Oncology Nurse Navigator Rockwell Germany, RN as Oncology Nurse Navigator  DIAGNOSIS:    ICD-10-CM   1. Malignant neoplasm of lower-outer quadrant of left breast of female, estrogen receptor negative ([place])  C50.512    Z17.1     SUMMARY OF ONCOLOGIC HISTORY: Oncology History  Malignant neoplasm of lower-outer quadrant of left breast of female, estrogen receptor negative ([place])  12/07/2018 Initial Diagnosis   Patient palpated a lump in the left breast. Mammogram showed a 1.4cm mass at the 5 o'clock position in the left breast, a left axillary lymph node measuring 2.8cm, and 2 smaller axillary lymph nodes with cortical thickening. 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   12/14/2018 Cancer Staging   Staging form: Breast, AJCC 8th Edition - Clinical: Stage IIB (cT1c, cN1, cM0, G3, ER-, PR-, HER2-) - Signed by Nicholas Lose, MD on 12/14/2018   12/23/2018 -  Chemotherapy   The patient had DOXOrubicin (ADRIAMYCIN) chemo injection 98 mg, 60 mg/m2 = 98 mg, Intravenous,  Once, 4 of 4 cycles Dose modification: 50 mg/m2 (original dose 60 mg/m2, Cycle 2, Reason: Dose not tolerated) Administration: 98 mg (12/23/2018), 82 mg (01/06/2019), 82 mg (01/20/2019), 82 mg (02/02/2019) palonosetron (ALOXI) injection 0.25 mg, 0.25 mg, Intravenous,  Once, 7 of 7 cycles Administration: 0.25 mg (12/23/2018), 0.25 mg (02/17/2019), 0.25 mg (01/06/2019), 0.25 mg (01/20/2019), 0.25 mg (02/02/2019), 0.25 mg (03/31/2019), 0.25 mg (04/21/2019) pegfilgrastim-jmdb (FULPHILA) injection 6 mg, 6 mg, Subcutaneous,  Once, 4 of 4 cycles Administration: 6 mg (12/26/2018), 6 mg (01/09/2019), 6 mg (01/23/2019), 6 mg (02/04/2019) CARBOplatin (PARAPLATIN) 570 mg in sodium chloride 0.9 % 250 mL chemo infusion, 570 mg (105.4 % of original dose 544.2 mg), Intravenous,  Once, 1 of 1 cycle Dose  modification:   (original dose 544.2 mg, Cycle 5) Administration: 570 mg (02/17/2019) cyclophosphamide (CYTOXAN) 980 mg in sodium chloride 0.9 % 250 mL chemo infusion, 600 mg/m2 = 980 mg, Intravenous,  Once, 4 of 4 cycles Dose modification: 500 mg/m2 (original dose 600 mg/m2, Cycle 2, Reason: Dose not tolerated) Administration: 980 mg (12/23/2018), 820 mg (01/06/2019), 820 mg (01/20/2019), 820 mg (02/02/2019) PACLitaxel (TAXOL) 132 mg in sodium chloride 0.9 % 250 mL chemo infusion (</= 64m/m2), 80 mg/m2 = 132 mg, Intravenous,  Once, 4 of 4 cycles Dose modification: 65 mg/m2 (original dose 80 mg/m2, Cycle 5, Reason: Provider Judgment), 50 mg/m2 (original dose 80 mg/m2, Cycle 6, Reason: Provider Judgment), 65 mg/m2 (original dose 80 mg/m2, Cycle 6, Reason: Provider Judgment) Administration: 132 mg (02/17/2019), 108 mg (02/24/2019), 84 mg (03/31/2019), 84 mg (04/07/2019), 108 mg (04/14/2019), 108 mg (04/21/2019), 108 mg (04/28/2019), 108 mg (05/05/2019), 108 mg (05/12/2019), 84 mg (03/24/2019) fosaprepitant (EMEND) 150 mg, dexamethasone (DECADRON) 12 mg in sodium chloride 0.9 % 145 mL IVPB, , Intravenous,  Once, 7 of 7 cycles Administration:  (12/23/2018),  (02/17/2019),  (01/06/2019),  (01/20/2019),  (02/02/2019),  (03/31/2019),  (04/21/2019)  for chemotherapy treatment.      CHIEF COMPLIANT: Cycle11Taxol  INTERVAL HISTORY: Heather Hebert a 59y.o. 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 She is currently receiving neoadjuvant chemotherapywith weekly Taxol after completing 4 cycles ofdose dense Adriamycin and Cytoxan.She presents to the clinic todayforcycle11.   She denies any peripheral neuropathy.  Denies any nausea or vomiting.  She received a second Covid vaccine yesterday and she  feels more fatigued and cold today.    ALLERGIES:  has No Known Allergies.  MEDICATIONS:  Current Outpatient Medications  Medication Sig Dispense Refill  . Calcium Carb-Cholecalciferol (CALCIUM  600 + D PO) Take 1 tablet by mouth 2 (two) times daily.    . cetirizine (ZYRTEC) 10 MG tablet Take 10 mg by mouth daily.    . Flax OIL Take by mouth. One cap per day    . fluocinolone (SYNALAR) 0.025 % cream Apply topically 3 (three) times daily. (Patient not taking: Reported on 05/12/2019) 120 g 1  . lidocaine-prilocaine (EMLA) cream Apply to affected area once 30 g 3  . LORazepam (ATIVAN) 0.5 MG tablet Take 1 tablet (0.5 mg total) by mouth at bedtime as needed for sleep. 30 tablet 0  . Multiple Vitamin (MULTIVITAMIN WITH MINERALS) TABS tablet Take 1 tablet by mouth daily.    . Olopatadine HCl (PATADAY OP) Place 1 drop into both eyes daily as needed. For dry eyes    . ondansetron (ZOFRAN) 8 MG tablet Take 1 tablet (8 mg total) by mouth 2 (two) times daily as needed. Start on the third day after chemotherapy. 30 tablet 1  . Probiotic Product (PROBIOTIC PO) Take 1 tablet by mouth every other day.     . prochlorperazine (COMPAZINE) 10 MG tablet Take 1 tablet (10 mg total) by mouth every 6 (six) hours as needed (Nausea or vomiting). 30 tablet 1   No current facility-administered medications for this visit.    PHYSICAL EXAMINATION: ECOG PERFORMANCE STATUS: 1 - Symptomatic but completely ambulatory  Vitals:   05/19/19 1018  BP: 113/70  Pulse: (!) 104  Resp: 18  Temp: 98.9 F (37.2 C)  SpO2: 100%   Filed Weights   05/19/19 1018  Weight: 132 lb 8 oz (60.1 kg)    LABORATORY DATA:  I have reviewed the data as listed CMP Latest Ref Rng & Units 05/19/2019 05/12/2019 05/05/2019  Glucose 70 - 99 mg/dL 93 109(H) 94  BUN 6 - 20 mg/dL '8 9 9  ' Creatinine 0.44 - 1.00 mg/dL 0.69 0.67 0.66  Sodium 135 - 145 mmol/L 133(L) 140 138  Potassium 3.5 - 5.1 mmol/L 3.9 3.9 3.9  Chloride 98 - 111 mmol/L 101 104 105  CO2 22 - 32 mmol/L '25 26 26  ' Calcium 8.9 - 10.3 mg/dL 9.3 9.3 9.2  Total Protein 6.5 - 8.1 g/dL 6.7 6.7 6.6  Total Bilirubin 0.3 - 1.2 mg/dL 0.3 0.2(L) 0.3  Alkaline Phos 38 - 126 U/L 95 100 93    AST 15 - 41 U/L 19 23 32  ALT 0 - 44 U/L '14 14 13    ' Lab Results  Component Value Date   WBC 4.7 05/19/2019   HGB 11.0 (L) 05/19/2019   HCT 32.8 (L) 05/19/2019   MCV 98.8 05/19/2019   PLT 163 05/19/2019   NEUTROABS 3.8 05/19/2019    ASSESSMENT & PLAN:  Malignant neoplasm of lower-outer quadrant of left breast of female, estrogen receptor negative ([place]) 12/07/2018:Patient palpated a lump in the left breast. Mammogram showed a 1.4cm mass at the 5 o'clock position in the left breast, a left axillary lymph node measuring 2.8cm, and 2 smaller axillary lymph nodes with cortical thickening. 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 Neoadjuvant chemotherapy with Adriamycin and Cytoxan dose dense 4 followed byTaxolweekly 12with carboplatin every 3 weeks--carbo eliminated due to cytopenias 2. Followed  by breast conserving surgery withaxillary lymph nodedissection(6 lymph nodes were found on MRI) 3. Followed by adjuvant radiation therapy  Breast MRI on 12/21/18 showed the known 1.5cm mass in the left breast, at least 6 abnormal left axillary lymph nodes, and no right breast malignancy -------------------------------------------------------------------------------------------------------------------------------------------------- Current treatment:Completed 4 cycles ofdose dense Adriamycin and Cytoxan,today iscycle11Taxol Echocardiogram: 12/21/2018: EF 60 to 65%  Chemo toxicities: 1.Severe cytopenias: Carboplatin discontinued. Primarily due to thrombocytopenia.Monitoring closely. No clinical signs symptoms of neuropathy. 2.Leukopenia/neutropenia: Resolved 3.Chemotherapy-induced anemia: Slowly improving.Hemoglobin is 11   We  increased the dosage of Taxol to 65 mg per metered square with cycle 6. I ordered a breast MRI to be done after the 12th cycle of chemo. I will see her with a  virtual visit after the MRI.  Return to clinic weekly for Taxol every other week for follow-up with me.     No orders of the defined types were placed in this encounter.  The patient has a good understanding of the overall plan. she agrees with it. she will call with any problems that may develop before the next visit here.  Total time spent: 30 mins including face to face time and time spent for planning, charting and coordination of care  Nicholas Lose, MD 05/19/2019  I, Cloyde Reams Dorshimer, am acting as scribe for Dr. Nicholas Lose.  I have reviewed the above documentation for accuracy and completeness, and I agree with the above.

## 2019-05-19 ENCOUNTER — Other Ambulatory Visit: Payer: Self-pay

## 2019-05-19 ENCOUNTER — Inpatient Hospital Stay: Payer: Commercial Managed Care - PPO

## 2019-05-19 ENCOUNTER — Inpatient Hospital Stay (HOSPITAL_BASED_OUTPATIENT_CLINIC_OR_DEPARTMENT_OTHER): Payer: Commercial Managed Care - PPO | Admitting: Hematology and Oncology

## 2019-05-19 VITALS — HR 102

## 2019-05-19 DIAGNOSIS — Z171 Estrogen receptor negative status [ER-]: Secondary | ICD-10-CM | POA: Diagnosis not present

## 2019-05-19 DIAGNOSIS — Z95828 Presence of other vascular implants and grafts: Secondary | ICD-10-CM

## 2019-05-19 DIAGNOSIS — C50512 Malignant neoplasm of lower-outer quadrant of left female breast: Secondary | ICD-10-CM | POA: Diagnosis not present

## 2019-05-19 DIAGNOSIS — Z5111 Encounter for antineoplastic chemotherapy: Secondary | ICD-10-CM | POA: Diagnosis not present

## 2019-05-19 LAB — CMP (CANCER CENTER ONLY)
ALT: 14 U/L (ref 0–44)
AST: 19 U/L (ref 15–41)
Albumin: 3.7 g/dL (ref 3.5–5.0)
Alkaline Phosphatase: 95 U/L (ref 38–126)
Anion gap: 7 (ref 5–15)
BUN: 8 mg/dL (ref 6–20)
CO2: 25 mmol/L (ref 22–32)
Calcium: 9.3 mg/dL (ref 8.9–10.3)
Chloride: 101 mmol/L (ref 98–111)
Creatinine: 0.69 mg/dL (ref 0.44–1.00)
GFR, Est AFR Am: >60 mL/min (ref >60)
GFR, Estimated: >60 mL/min (ref >60)
Glucose, Bld: 93 mg/dL (ref 70–99)
Potassium: 3.9 mmol/L (ref 3.5–5.1)
Sodium: 133 mmol/L — ABNORMAL LOW (ref 135–145)
Total Bilirubin: 0.3 mg/dL (ref 0.3–1.2)
Total Protein: 6.7 g/dL (ref 6.5–8.1)

## 2019-05-19 LAB — CBC WITH DIFFERENTIAL (CANCER CENTER ONLY)
Abs Immature Granulocytes: 0.04 10*3/uL (ref 0.00–0.07)
Basophils Absolute: 0 10*3/uL (ref 0.0–0.1)
Basophils Relative: 1 %
Eosinophils Absolute: 0 10*3/uL (ref 0.0–0.5)
Eosinophils Relative: 0 %
HCT: 32.8 % — ABNORMAL LOW (ref 36.0–46.0)
Hemoglobin: 11 g/dL — ABNORMAL LOW (ref 12.0–15.0)
Immature Granulocytes: 1 %
Lymphocytes Relative: 7 %
Lymphs Abs: 0.3 10*3/uL — ABNORMAL LOW (ref 0.7–4.0)
MCH: 33.1 pg (ref 26.0–34.0)
MCHC: 33.5 g/dL (ref 30.0–36.0)
MCV: 98.8 fL (ref 80.0–100.0)
Monocytes Absolute: 0.5 10*3/uL (ref 0.1–1.0)
Monocytes Relative: 10 %
Neutro Abs: 3.8 10*3/uL (ref 1.7–7.7)
Neutrophils Relative %: 81 %
Platelet Count: 163 10*3/uL (ref 150–400)
RBC: 3.32 MIL/uL — ABNORMAL LOW (ref 3.87–5.11)
RDW: 14.6 % (ref 11.5–15.5)
WBC Count: 4.7 10*3/uL (ref 4.0–10.5)
nRBC: 0 % (ref 0.0–0.2)

## 2019-05-19 MED ORDER — HEPARIN SOD (PORK) LOCK FLUSH 100 UNIT/ML IV SOLN
500.0000 [IU] | Freq: Once | INTRAVENOUS | Status: AC | PRN
  Administered 2019-05-19: 500 [IU]
  Filled 2019-05-19: qty 5

## 2019-05-19 MED ORDER — SODIUM CHLORIDE 0.9 % IV SOLN
10.0000 mg | Freq: Once | INTRAVENOUS | Status: AC
  Administered 2019-05-19: 10 mg via INTRAVENOUS
  Filled 2019-05-19: qty 10

## 2019-05-19 MED ORDER — FAMOTIDINE IN NACL 20-0.9 MG/50ML-% IV SOLN
INTRAVENOUS | Status: AC
  Filled 2019-05-19: qty 50

## 2019-05-19 MED ORDER — FAMOTIDINE IN NACL 20-0.9 MG/50ML-% IV SOLN
20.0000 mg | Freq: Once | INTRAVENOUS | Status: AC
  Administered 2019-05-19: 20 mg via INTRAVENOUS

## 2019-05-19 MED ORDER — DIPHENHYDRAMINE HCL 50 MG/ML IJ SOLN
25.0000 mg | Freq: Once | INTRAMUSCULAR | Status: AC
  Administered 2019-05-19: 25 mg via INTRAVENOUS

## 2019-05-19 MED ORDER — SODIUM CHLORIDE 0.9 % IV SOLN
Freq: Once | INTRAVENOUS | Status: AC
  Filled 2019-05-19: qty 250

## 2019-05-19 MED ORDER — SODIUM CHLORIDE 0.9% FLUSH
10.0000 mL | INTRAVENOUS | Status: DC | PRN
  Administered 2019-05-19: 10 mL
  Filled 2019-05-19: qty 10

## 2019-05-19 MED ORDER — DIPHENHYDRAMINE HCL 50 MG/ML IJ SOLN
INTRAMUSCULAR | Status: AC
  Filled 2019-05-19: qty 1

## 2019-05-19 MED ORDER — SODIUM CHLORIDE 0.9 % IV SOLN
65.0000 mg/m2 | Freq: Once | INTRAVENOUS | Status: AC
  Administered 2019-05-19: 108 mg via INTRAVENOUS
  Filled 2019-05-19: qty 18

## 2019-05-19 NOTE — Patient Instructions (Signed)

## 2019-05-19 NOTE — Patient Instructions (Signed)
[facility] Discharge Instructions for Patients Receiving Chemotherapy  Today you received the following chemotherapy agent: Paclitaxel (Taxol)  To help prevent nausea and vomiting after your treatment, we encourage you to take your nausea medication as directed by your MD.   If you develop nausea and vomiting that is not controlled by your nausea medication, call the clinic.   BELOW ARE SYMPTOMS THAT SHOULD BE REPORTED IMMEDIATELY:  *FEVER GREATER THAN 100.5 F  *CHILLS WITH OR WITHOUT FEVER  NAUSEA AND VOMITING THAT IS NOT CONTROLLED WITH YOUR NAUSEA MEDICATION  *UNUSUAL SHORTNESS OF BREATH  *UNUSUAL BRUISING OR BLEEDING  TENDERNESS IN MOUTH AND THROAT WITH OR WITHOUT PRESENCE OF ULCERS  *URINARY PROBLEMS  *BOWEL PROBLEMS  UNUSUAL RASH Items with * indicate a potential emergency and should be followed up as soon as possible.  Feel free to call the clinic should you have any questions or concerns. The clinic phone number is [phone].  Please show the [place] at check-in to the Emergency Department and triage nurse.  Coronavirus (COVID-19) Are you at risk?  Are you at risk for the Coronavirus (COVID-19)?  To be considered HIGH RISK for Coronavirus (COVID-19), you have to meet the following criteria:  . Traveled to [place], [place], [place], [place] or [place]; or in the [place] to [place], [place], [place], or [place]; and have fever, cough, and shortness of breath within the last 2 weeks of travel OR . Been in close contact with a person diagnosed with COVID-19 within the last 2 weeks and have fever, cough, and shortness of breath . IF YOU DO NOT MEET THESE CRITERIA, YOU ARE CONSIDERED LOW RISK FOR COVID-19.  What to do if you are HIGH RISK for COVID-19?  Marland Kitchen If you are having a medical emergency, call 911. . Seek medical care right away. Before you go to a doctor's office, urgent care or emergency department, call ahead and tell  them about your recent travel, contact with someone diagnosed with COVID-19, and your symptoms. You should receive instructions from your physician's office regarding next steps of care.  . When you arrive at healthcare provider, tell the healthcare staff immediately you have returned from visiting [place], [place], [place], [place] or [place]; or traveled in the [place] to [place], [place], [place], or [place]; in the last two weeks or you have been in close contact with a person diagnosed with COVID-19 in the last 2 weeks.   . Tell the health care staff about your symptoms: fever, cough and shortness of breath. . After you have been seen by a medical provider, you will be either: o Tested for (COVID-19) and discharged home on quarantine except to seek medical care if symptoms worsen, and asked to  - Stay home and avoid contact with others until you get your results (4-5 days)  - Avoid travel on public transportation if possible (such as bus, train, or airplane) or o Sent to the Emergency Department by EMS for evaluation, COVID-19 testing, and possible admission depending on your condition and test results.  What to do if you are LOW RISK for COVID-19?  Reduce your risk of any infection by using the same precautions used for avoiding the common cold or flu:  Marland Kitchen Wash your hands often with soap and warm water for at least 20 seconds.  If soap and water are not readily available, use an alcohol-based hand sanitizer with at least 60% alcohol.  Marland Kitchen  If coughing or sneezing, cover your mouth and nose by coughing or sneezing into the elbow areas of your shirt or coat, into a tissue or into your sleeve (not your hands). . Avoid shaking hands with others and consider head nods or verbal greetings only. . Avoid touching your eyes, nose, or mouth with unwashed hands.  . Avoid close contact with people who are sick. . Avoid places or events with large numbers of people in one location, like concerts or  sporting events. . Carefully consider travel plans you have or are making. . If you are planning any travel outside or inside the [place], visit the CDC's Travelers' Health webpage for the latest health notices. . If you have some symptoms but not all symptoms, continue to monitor at home and seek medical attention if your symptoms worsen. . If you are having a medical emergency, call 911.   [place] [facility] / e-Visit: [email or web address]         [facility]: [facility]: [phone]                   [facility]: [phone]

## 2019-05-19 NOTE — Assessment & Plan Note (Signed)
12/07/2018:Patient palpated a lump in the left breast. Mammogram showed a 1.4cm mass at the 5 o'clock position in the left breast, a left axillary lymph node measuring 2.8cm, and 2 smaller axillary lymph nodes with cortical thickening. 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 Neoadjuvant chemotherapy with Adriamycin and Cytoxan dose dense 4 followed byTaxolweekly 12with carboplatin every 3 weeks--carbo eliminated due to cytopenias 2. Followed by breast conserving surgery withaxillary lymph nodedissection(6 lymph nodes were found on MRI) 3. Followed by adjuvant radiation therapy  Breast MRI on 12/21/18 showed the known 1.5cm mass in the left breast, at least 6 abnormal left axillary lymph nodes, and no right breast malignancy -------------------------------------------------------------------------------------------------------------------------------------------------- Current treatment:Completed 4 cycles ofdose dense Adriamycin and Cytoxan,today iscycle10Taxol Echocardiogram: 12/21/2018: EF 60 to 65%  Chemo toxicities: 1.Severe cytopenias: Carboplatin discontinued. Primarily due to thrombocytopenia.Monitoring closely. No clinical signs symptoms of neuropathy. 2.Leukopenia/neutropenia: We are watching this very closely.Today's ANC is 3.6 3.Chemotherapy-induced anemia: Slowly improving.Hemoglobin is 10.4   We  increased the dosage of Taxol to 65 mg per metered squared with cycle 6. I ordered a breast MRI to be done after the 12th cycle of chemo. I will see her with a virtual visit after the MRI.  Return to clinic weekly for Taxol every other week for follow-up with me.

## 2019-05-19 NOTE — Progress Notes (Signed)
Pt's HR is 102-104, ok to treat per Dr. Lindi Adie.

## 2019-05-22 ENCOUNTER — Telehealth: Payer: Self-pay | Admitting: Hematology and Oncology

## 2019-05-22 ENCOUNTER — Encounter: Payer: Self-pay | Admitting: *Deleted

## 2019-05-22 NOTE — Progress Notes (Signed)
Pharmacist Chemotherapy Monitoring - Follow Up Assessment    I verify that I have reviewed each item in the below checklist:  . Regimen for the patient is scheduled for the appropriate day and plan matches scheduled date. Marland Kitchen Appropriate non-routine labs are ordered dependent on drug ordered. . If applicable, additional medications reviewed and ordered per protocol based on lifetime cumulative doses and/or treatment regimen.   Plan for follow-up and/or issues identified: No . I-vent associated with next due treatment: No . MD and/or nursing notified: No   Kennith Center, Pharm.D., CPP 05/22/2019@3 :09 PM

## 2019-05-22 NOTE — Telephone Encounter (Signed)
Called patient regarding upcoming appointments, patient has been called and notified. 

## 2019-05-26 ENCOUNTER — Inpatient Hospital Stay: Payer: Commercial Managed Care - PPO

## 2019-05-26 ENCOUNTER — Encounter: Payer: Self-pay | Admitting: *Deleted

## 2019-05-26 ENCOUNTER — Inpatient Hospital Stay: Payer: Commercial Managed Care - PPO | Admitting: Hematology and Oncology

## 2019-05-26 ENCOUNTER — Other Ambulatory Visit: Payer: Self-pay

## 2019-05-26 VITALS — BP 125/72 | HR 94 | Temp 98.2°F | Resp 17 | Wt 132.0 lb

## 2019-05-26 DIAGNOSIS — C50512 Malignant neoplasm of lower-outer quadrant of left female breast: Secondary | ICD-10-CM

## 2019-05-26 DIAGNOSIS — Z5111 Encounter for antineoplastic chemotherapy: Secondary | ICD-10-CM | POA: Diagnosis not present

## 2019-05-26 LAB — CBC WITH DIFFERENTIAL (CANCER CENTER ONLY)
Abs Immature Granulocytes: 0.01 10*3/uL (ref 0.00–0.07)
Basophils Absolute: 0 10*3/uL (ref 0.0–0.1)
Basophils Relative: 1 %
Eosinophils Absolute: 0.1 10*3/uL (ref 0.0–0.5)
Eosinophils Relative: 4 %
HCT: 33.5 % — ABNORMAL LOW (ref 36.0–46.0)
Hemoglobin: 11 g/dL — ABNORMAL LOW (ref 12.0–15.0)
Immature Granulocytes: 0 %
Lymphocytes Relative: 25 %
Lymphs Abs: 0.8 10*3/uL (ref 0.7–4.0)
MCH: 32.6 pg (ref 26.0–34.0)
MCHC: 32.8 g/dL (ref 30.0–36.0)
MCV: 99.4 fL (ref 80.0–100.0)
Monocytes Absolute: 0.3 10*3/uL (ref 0.1–1.0)
Monocytes Relative: 11 %
Neutro Abs: 1.8 10*3/uL (ref 1.7–7.7)
Neutrophils Relative %: 59 %
Platelet Count: 172 10*3/uL (ref 150–400)
RBC: 3.37 MIL/uL — ABNORMAL LOW (ref 3.87–5.11)
RDW: 14.4 % (ref 11.5–15.5)
WBC Count: 3 10*3/uL — ABNORMAL LOW (ref 4.0–10.5)
nRBC: 0 % (ref 0.0–0.2)

## 2019-05-26 LAB — CMP (CANCER CENTER ONLY)
ALT: 23 U/L (ref 0–44)
AST: 27 U/L (ref 15–41)
Albumin: 3.5 g/dL (ref 3.5–5.0)
Alkaline Phosphatase: 100 U/L (ref 38–126)
Anion gap: 14 (ref 5–15)
BUN: 11 mg/dL (ref 6–20)
CO2: 23 mmol/L (ref 22–32)
Calcium: 9.2 mg/dL (ref 8.9–10.3)
Chloride: 104 mmol/L (ref 98–111)
Creatinine: 0.72 mg/dL (ref 0.44–1.00)
GFR, Est AFR Am: >60 mL/min (ref >60)
GFR, Estimated: >60 mL/min (ref >60)
Glucose, Bld: 131 mg/dL — ABNORMAL HIGH (ref 70–99)
Potassium: 3.7 mmol/L (ref 3.5–5.1)
Sodium: 141 mmol/L (ref 135–145)
Total Bilirubin: <0.2 mg/dL — ABNORMAL LOW (ref 0.3–1.2)
Total Protein: 6.8 g/dL (ref 6.5–8.1)

## 2019-05-26 MED ORDER — SODIUM CHLORIDE 0.9 % IV SOLN
65.0000 mg/m2 | Freq: Once | INTRAVENOUS | Status: AC
  Administered 2019-05-26: 108 mg via INTRAVENOUS
  Filled 2019-05-26: qty 18

## 2019-05-26 MED ORDER — SODIUM CHLORIDE 0.9 % IV SOLN
Freq: Once | INTRAVENOUS | Status: AC
  Filled 2019-05-26: qty 250

## 2019-05-26 MED ORDER — FAMOTIDINE IN NACL 20-0.9 MG/50ML-% IV SOLN
20.0000 mg | Freq: Once | INTRAVENOUS | Status: AC
  Administered 2019-05-26: 20 mg via INTRAVENOUS

## 2019-05-26 MED ORDER — FAMOTIDINE IN NACL 20-0.9 MG/50ML-% IV SOLN
INTRAVENOUS | Status: AC
  Filled 2019-05-26: qty 50

## 2019-05-26 MED ORDER — SODIUM CHLORIDE 0.9 % IV SOLN
10.0000 mg | Freq: Once | INTRAVENOUS | Status: AC
  Administered 2019-05-26: 10 mg via INTRAVENOUS
  Filled 2019-05-26: qty 10

## 2019-05-26 MED ORDER — HEPARIN SOD (PORK) LOCK FLUSH 100 UNIT/ML IV SOLN
500.0000 [IU] | Freq: Once | INTRAVENOUS | Status: AC | PRN
  Administered 2019-05-26: 500 [IU]
  Filled 2019-05-26: qty 5

## 2019-05-26 MED ORDER — SODIUM CHLORIDE 0.9% FLUSH
10.0000 mL | INTRAVENOUS | Status: DC | PRN
  Administered 2019-05-26: 10 mL
  Filled 2019-05-26: qty 10

## 2019-05-26 MED ORDER — DIPHENHYDRAMINE HCL 50 MG/ML IJ SOLN
25.0000 mg | Freq: Once | INTRAMUSCULAR | Status: AC
  Administered 2019-05-26: 25 mg via INTRAVENOUS

## 2019-05-26 MED ORDER — DIPHENHYDRAMINE HCL 50 MG/ML IJ SOLN
INTRAMUSCULAR | Status: AC
  Filled 2019-05-26: qty 1

## 2019-05-26 NOTE — Patient Instructions (Signed)
[facility] Discharge Instructions for Patients Receiving Chemotherapy  Today you received the following chemotherapy agents:  Taxol.  To help prevent nausea and vomiting after your treatment, we encourage you to take your nausea medication as directed.   If you develop nausea and vomiting that is not controlled by your nausea medication, call the clinic.   BELOW ARE SYMPTOMS THAT SHOULD BE REPORTED IMMEDIATELY:  *FEVER GREATER THAN 100.5 F  *CHILLS WITH OR WITHOUT FEVER  NAUSEA AND VOMITING THAT IS NOT CONTROLLED WITH YOUR NAUSEA MEDICATION  *UNUSUAL SHORTNESS OF BREATH  *UNUSUAL BRUISING OR BLEEDING  TENDERNESS IN MOUTH AND THROAT WITH OR WITHOUT PRESENCE OF ULCERS  *URINARY PROBLEMS  *BOWEL PROBLEMS  UNUSUAL RASH Items with * indicate a potential emergency and should be followed up as soon as possible.  Feel free to call the clinic should you have any questions or concerns. The clinic phone number is [phone].  Please show the CHEMO ALERT CARD at check-in to the Emergency Department and triage nurse.   

## 2019-05-29 ENCOUNTER — Other Ambulatory Visit: Payer: Self-pay

## 2019-05-29 ENCOUNTER — Ambulatory Visit: Payer: Commercial Managed Care - PPO | Admitting: Hematology and Oncology

## 2019-05-29 ENCOUNTER — Encounter: Payer: Self-pay | Admitting: *Deleted

## 2019-05-29 ENCOUNTER — Ambulatory Visit
Admission: RE | Admit: 2019-05-29 | Discharge: 2019-05-29 | Disposition: A | Discharge status: Home / Self Care | Payer: Commercial Managed Care - PPO | Source: Ambulatory Visit | Attending: Hematology and Oncology | Admitting: Hematology and Oncology

## 2019-05-29 DIAGNOSIS — C50512 Malignant neoplasm of lower-outer quadrant of left female breast: Secondary | ICD-10-CM

## 2019-05-29 DIAGNOSIS — Z171 Estrogen receptor negative status [ER-]: Secondary | ICD-10-CM

## 2019-05-29 MED ORDER — GADOBUTROL 1 MMOL/ML IV SOLN
6.0000 mL | Freq: Once | INTRAVENOUS | Status: AC | PRN
  Administered 2019-05-29: 6 mL via INTRAVENOUS

## 2019-05-29 NOTE — Progress Notes (Signed)
HEMATOLOGY-ONCOLOGY MYCHART VIDEO VISIT PROGRESS NOTE  I connected with Heather Hebert on 05/30/2019 at  9:00 AM EDT by MyChart video conference and verified that I am speaking with the correct person using two identifiers.  I discussed the limitations, risks, security and privacy concerns of performing an evaluation and management service by MyChart and the availability of in person appointments.  I also discussed with the patient that there may be a patient responsible charge related to this service. The patient expressed understanding and agreed to proceed.  Patient's Location: Home Physician Location: Clinic  CHIEF COMPLIANT: Follow-up to review breast MRI  INTERVAL HISTORY: Heather Hebert is a 59 y.o. female with above-mentioned history of triple negative left breast cancer who completed neoadjuvant chemotherapy. Breast MRI on 05/29/19 showed complete resolution of the left breast mass and axillary adenopathy consistent with response to treatment.She presents over MyChart today to review her MRI.   Oncology History  Malignant neoplasm of lower-outer quadrant of left breast of female, estrogen receptor negative ([place])  12/07/2018 Initial Diagnosis   Patient palpated a lump in the left breast. Mammogram showed a 1.4cm mass at the 5 o'clock position in the left breast, a left axillary lymph node measuring 2.8cm, and 2 smaller axillary lymph nodes with cortical thickening. 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   12/14/2018 Cancer Staging   Staging form: Breast, AJCC 8th Edition - Clinical: Stage IIB (cT1c, cN1, cM0, G3, ER-, PR-, HER2-) - Signed by Heather Lose, MD on 12/14/2018   12/23/2018 -  Chemotherapy   The patient had DOXOrubicin (ADRIAMYCIN) chemo injection 98 mg, 60 mg/m2 = 98 mg, Intravenous,  Once, 4 of 4 cycles Dose modification: 50 mg/m2 (original dose 60 mg/m2, Cycle 2, Reason: Dose not tolerated) Administration: 98 mg  (12/23/2018), 82 mg (01/06/2019), 82 mg (01/20/2019), 82 mg (02/02/2019) palonosetron (ALOXI) injection 0.25 mg, 0.25 mg, Intravenous,  Once, 7 of 7 cycles Administration: 0.25 mg (12/23/2018), 0.25 mg (02/17/2019), 0.25 mg (01/06/2019), 0.25 mg (01/20/2019), 0.25 mg (02/02/2019), 0.25 mg (03/31/2019), 0.25 mg (04/21/2019) pegfilgrastim-jmdb (FULPHILA) injection 6 mg, 6 mg, Subcutaneous,  Once, 4 of 4 cycles Administration: 6 mg (12/26/2018), 6 mg (01/09/2019), 6 mg (01/23/2019), 6 mg (02/04/2019) CARBOplatin (PARAPLATIN) 570 mg in sodium chloride 0.9 % 250 mL chemo infusion, 570 mg (105.4 % of original dose 544.2 mg), Intravenous,  Once, 1 of 1 cycle Dose modification:   (original dose 544.2 mg, Cycle 5) Administration: 570 mg (02/17/2019) cyclophosphamide (CYTOXAN) 980 mg in sodium chloride 0.9 % 250 mL chemo infusion, 600 mg/m2 = 980 mg, Intravenous,  Once, 4 of 4 cycles Dose modification: 500 mg/m2 (original dose 600 mg/m2, Cycle 2, Reason: Dose not tolerated) Administration: 980 mg (12/23/2018), 820 mg (01/06/2019), 820 mg (01/20/2019), 820 mg (02/02/2019) PACLitaxel (TAXOL) 132 mg in sodium chloride 0.9 % 250 mL chemo infusion (</= 87m/m2), 80 mg/m2 = 132 mg, Intravenous,  Once, 4 of 4 cycles Dose modification: 65 mg/m2 (original dose 80 mg/m2, Cycle 5, Reason: Provider Judgment), 50 mg/m2 (original dose 80 mg/m2, Cycle 6, Reason: Provider Judgment), 65 mg/m2 (original dose 80 mg/m2, Cycle 6, Reason: Provider Judgment) Administration: 132 mg (02/17/2019), 108 mg (02/24/2019), 84 mg (03/31/2019), 84 mg (04/07/2019), 108 mg (04/14/2019), 108 mg (04/21/2019), 108 mg (04/28/2019), 108 mg (05/05/2019), 108 mg (05/12/2019), 108 mg (05/19/2019), 108 mg (05/26/2019), 84 mg (03/24/2019) fosaprepitant (EMEND) 150 mg, dexamethasone (DECADRON) 12 mg in sodium chloride 0.9 % 145 mL IVPB, , Intravenous,  Once, 7 of 7 cycles Administration:  (12/23/2018),  (02/17/2019),  (01/06/2019),  (01/20/2019),  (02/02/2019),  (03/31/2019),   (04/21/2019)  for chemotherapy treatment.      Observations/Objective:  There were no vitals filed for this visit. There is no height or weight on file to calculate BMI.  I have reviewed the data as listed CMP Latest Ref Rng & Units 05/26/2019 05/19/2019 05/12/2019  Glucose 70 - 99 mg/dL 131(H) 93 109(H)  BUN 6 - 20 mg/dL '11 8 9  ' Creatinine 0.44 - 1.00 mg/dL 0.72 0.69 0.67  Sodium 135 - 145 mmol/L 141 133(L) 140  Potassium 3.5 - 5.1 mmol/L 3.7 3.9 3.9  Chloride 98 - 111 mmol/L 104 101 104  CO2 22 - 32 mmol/L '23 25 26  ' Calcium 8.9 - 10.3 mg/dL 9.2 9.3 9.3  Total Protein 6.5 - 8.1 g/dL 6.8 6.7 6.7  Total Bilirubin 0.3 - 1.2 mg/dL <0.2(L) 0.3 0.2(L)  Alkaline Phos 38 - 126 U/L 100 95 100  AST 15 - 41 U/L '27 19 23  ' ALT 0 - 44 U/L '23 14 14    ' Lab Results  Component Value Date   WBC 3.0 (L) 05/26/2019   HGB 11.0 (L) 05/26/2019   HCT 33.5 (L) 05/26/2019   MCV 99.4 05/26/2019   PLT 172 05/26/2019   NEUTROABS 1.8 05/26/2019      Assessment Plan:  Malignant neoplasm of lower-outer quadrant of left breast of female, estrogen receptor negative ([place]) 12/07/2018:Patient palpated a lump in the left breast. Mammogram showed a 1.4cm mass at the 5 o'clock position in the left breast, a left axillary lymph node measuring 2.8cm, and 2 smaller axillary lymph nodes with cortical thickening. 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 Neoadjuvant chemotherapy with Adriamycin and Cytoxan dose dense 4 followed byTaxolweekly 12with carboplatin every 3 weeks--carbo eliminated due to cytopenias completed 05/26/2019 2. Followed by breast conserving surgery withaxillary lymph nodedissection(6 lymph nodes were found on MRI) 3. Followed by adjuvant radiation therapy  Breast MRI on 12/21/18 showed the known 1.5cm mass in the left breast, at least 6 abnormal left axillary lymph nodes, and no right breast  malignancy -------------------------------------------------------------------------------------------------------------------------------------------------- Breast MRI 05/29/2019: Complete imaging resolution consistent with excellent response to chemotherapy. Radiology review: I discussed with her that the MRI breast showed fantastic response to chemo.  We will see her back after surgery to review the results of surgical pathology.    I discussed the assessment and treatment plan with the patient. The patient was provided an opportunity to ask questions and all were answered. The patient agreed with the plan and demonstrated an understanding of the instructions. The patient was advised to call back or seek an in-person evaluation if the symptoms worsen or if the condition fails to improve as anticipated.   I provided 20 minutes of face-to-face MyChart video visit time during this encounter.    Rulon Eisenmenger, MD 05/30/2019   I, Molly Dorshimer, am acting as scribe for Heather Lose, MD.  I have reviewed the above documentation for accuracy and completeness, and I agree with the above.

## 2019-05-30 ENCOUNTER — Inpatient Hospital Stay (HOSPITAL_BASED_OUTPATIENT_CLINIC_OR_DEPARTMENT_OTHER): Payer: Commercial Managed Care - PPO | Admitting: Hematology and Oncology

## 2019-05-30 DIAGNOSIS — C50512 Malignant neoplasm of lower-outer quadrant of left female breast: Secondary | ICD-10-CM | POA: Diagnosis not present

## 2019-05-30 DIAGNOSIS — Z171 Estrogen receptor negative status [ER-]: Secondary | ICD-10-CM | POA: Diagnosis not present

## 2019-05-30 NOTE — Assessment & Plan Note (Signed)
12/07/2018:Patient palpated a lump in the left breast. Mammogram showed a 1.4cm mass at the 5 o'clock position in the left breast, a left axillary lymph node measuring 2.8cm, and 2 smaller axillary lymph nodes with cortical thickening. 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 Neoadjuvant chemotherapy with Adriamycin and Cytoxan dose dense 4 followed byTaxolweekly 12with carboplatin every 3 weeks--carbo eliminated due to cytopenias completed 05/26/2019 2. Followed by breast conserving surgery withaxillary lymph nodedissection(6 lymph nodes were found on MRI) 3. Followed by adjuvant radiation therapy  Breast MRI on 12/21/18 showed the known 1.5cm mass in the left breast, at least 6 abnormal left axillary lymph nodes, and no right breast malignancy -------------------------------------------------------------------------------------------------------------------------------------------------- Breast MRI 05/29/2019: Complete imaging resolution consistent with excellent response to chemotherapy. Radiology review: I discussed with her that the MRI breast showed fantastic response to chemo.  We will see her back after surgery to review the results of surgical pathology.

## 2019-06-05 ENCOUNTER — Encounter: Payer: Self-pay | Admitting: *Deleted

## 2019-06-05 ENCOUNTER — Other Ambulatory Visit: Payer: Self-pay | Admitting: General Surgery

## 2019-06-05 DIAGNOSIS — C50512 Malignant neoplasm of lower-outer quadrant of left female breast: Secondary | ICD-10-CM

## 2019-06-06 ENCOUNTER — Other Ambulatory Visit: Payer: Self-pay | Admitting: General Surgery

## 2019-06-08 ENCOUNTER — Other Ambulatory Visit: Payer: Self-pay | Admitting: General Surgery

## 2019-06-08 DIAGNOSIS — C50512 Malignant neoplasm of lower-outer quadrant of left female breast: Secondary | ICD-10-CM

## 2019-06-13 ENCOUNTER — Encounter: Payer: Self-pay | Admitting: *Deleted

## 2019-06-13 DIAGNOSIS — Z171 Estrogen receptor negative status [ER-]: Secondary | ICD-10-CM

## 2019-06-13 DIAGNOSIS — C50512 Malignant neoplasm of lower-outer quadrant of left female breast: Secondary | ICD-10-CM

## 2019-06-14 ENCOUNTER — Telehealth: Payer: Self-pay | Admitting: Hematology and Oncology

## 2019-06-14 NOTE — Telephone Encounter (Signed)
Pt is aware of appt on 6/1. Per 5/4 sch msg.

## 2019-06-20 ENCOUNTER — Encounter: Payer: Self-pay | Admitting: Hematology and Oncology

## 2019-06-20 ENCOUNTER — Ambulatory Visit: Payer: Commercial Managed Care - PPO | Admitting: Radiation Oncology

## 2019-06-20 ENCOUNTER — Other Ambulatory Visit: Payer: Self-pay | Admitting: *Deleted

## 2019-06-20 MED ORDER — VALACYCLOVIR HCL 1 G PO TABS: 1000.0000 mg | ORAL_TABLET | Freq: Two times a day (BID) | ORAL | 0 refills | Status: DC

## 2019-06-22 ENCOUNTER — Encounter: Payer: Self-pay | Admitting: *Deleted

## 2019-06-27 ENCOUNTER — Telehealth: Payer: Self-pay | Admitting: *Deleted

## 2019-06-27 NOTE — Telephone Encounter (Signed)
Received call from pt stating no improvements in her body rash after 1 week on acyclovir.  Pt states some spots seem to be more red and irritated than others.  Per MD pt to be seen by Wilber Bihari, NP for further evaluation and treatment.  Apt scheduled and pt verbalized understating of date and time.

## 2019-06-28 ENCOUNTER — Inpatient Hospital Stay: Payer: Commercial Managed Care - PPO | Attending: Hematology and Oncology | Admitting: Adult Health

## 2019-06-28 ENCOUNTER — Encounter: Payer: Self-pay | Admitting: Adult Health

## 2019-06-28 ENCOUNTER — Other Ambulatory Visit: Payer: Self-pay

## 2019-06-28 VITALS — BP 136/81 | HR 89 | Temp 98.7°F | Resp 17 | Ht 62.0 in | Wt 132.4 lb

## 2019-06-28 DIAGNOSIS — Z803 Family history of malignant neoplasm of breast: Secondary | ICD-10-CM | POA: Insufficient documentation

## 2019-06-28 DIAGNOSIS — Z87891 Personal history of nicotine dependence: Secondary | ICD-10-CM | POA: Insufficient documentation

## 2019-06-28 DIAGNOSIS — C773 Secondary and unspecified malignant neoplasm of axilla and upper limb lymph nodes: Secondary | ICD-10-CM | POA: Insufficient documentation

## 2019-06-28 DIAGNOSIS — Z79899 Other long term (current) drug therapy: Secondary | ICD-10-CM | POA: Diagnosis not present

## 2019-06-28 DIAGNOSIS — C50512 Malignant neoplasm of lower-outer quadrant of left female breast: Secondary | ICD-10-CM | POA: Diagnosis present

## 2019-06-28 DIAGNOSIS — Z171 Estrogen receptor negative status [ER-]: Secondary | ICD-10-CM | POA: Diagnosis not present

## 2019-06-28 DIAGNOSIS — R21 Rash and other nonspecific skin eruption: Secondary | ICD-10-CM | POA: Diagnosis not present

## 2019-06-28 MED ORDER — CLOTRIMAZOLE-BETAMETHASONE 1-0.05 % EX CREA: 1.0000 "application " | TOPICAL_CREAM | Freq: Two times a day (BID) | CUTANEOUS | 0 refills | Status: DC

## 2019-06-28 NOTE — Progress Notes (Signed)
[facility] Follow up:    Heather Hebert, [place] [address]   DIAGNOSIS: Cancer Staging Malignant neoplasm of lower-outer quadrant of left breast of female, estrogen receptor negative ([place]) Staging form: Breast, AJCC 8th Edition - Clinical: Stage IIB (cT1c, cN1, cM0, G3, ER-, PR-, HER2-) - Signed by Nicholas Lose, MD on 12/14/2018   SUMMARY OF ONCOLOGIC HISTORY: Oncology History  Malignant neoplasm of lower-outer quadrant of left breast of female, estrogen receptor negative ([name])  12/07/2018 Initial Diagnosis   Patient palpated a lump in the left breast. Mammogram showed a 1.4cm mass at the 5 o'clock position in the left breast, a left axillary lymph node measuring 2.8cm, and 2 smaller axillary lymph nodes with cortical thickening. 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   12/14/2018 Cancer Staging   Staging form: Breast, AJCC 8th Edition - Clinical: Stage IIB (cT1c, cN1, cM0, G3, ER-, PR-, HER2-) - Signed by Nicholas Lose, MD on 12/14/2018   12/23/2018 -  Chemotherapy   The patient had DOXOrubicin (ADRIAMYCIN) chemo injection 98 mg, 60 mg/m2 = 98 mg, Intravenous,  Once, 4 of 4 cycles Dose modification: 50 mg/m2 (original dose 60 mg/m2, Cycle 2, Reason: Dose not tolerated) Administration: 98 mg (12/23/2018), 82 mg (01/06/2019), 82 mg (01/20/2019), 82 mg (02/02/2019) palonosetron (ALOXI) injection 0.25 mg, 0.25 mg, Intravenous,  Once, 7 of 7 cycles Administration: 0.25 mg (12/23/2018), 0.25 mg (02/17/2019), 0.25 mg (01/06/2019), 0.25 mg (01/20/2019), 0.25 mg (02/02/2019), 0.25 mg (03/31/2019), 0.25 mg (04/21/2019) pegfilgrastim-jmdb (FULPHILA) injection 6 mg, 6 mg, Subcutaneous,  Once, 4 of 4 cycles Administration: 6 mg (12/26/2018), 6 mg (01/09/2019), 6 mg (01/23/2019), 6 mg (02/04/2019) CARBOplatin (PARAPLATIN) 570 mg in sodium chloride 0.9 % 250 mL chemo infusion, 570 mg (105.4 % of  original dose 544.2 mg), Intravenous,  Once, 1 of 1 cycle Dose modification:   (original dose 544.2 mg, Cycle 5) Administration: 570 mg (02/17/2019) cyclophosphamide (CYTOXAN) 980 mg in sodium chloride 0.9 % 250 mL chemo infusion, 600 mg/m2 = 980 mg, Intravenous,  Once, 4 of 4 cycles Dose modification: 500 mg/m2 (original dose 600 mg/m2, Cycle 2, Reason: Dose not tolerated) Administration: 980 mg (12/23/2018), 820 mg (01/06/2019), 820 mg (01/20/2019), 820 mg (02/02/2019) PACLitaxel (TAXOL) 132 mg in sodium chloride 0.9 % 250 mL chemo infusion (</= 28m/m2), 80 mg/m2 = 132 mg, Intravenous,  Once, 4 of 4 cycles Dose modification: 65 mg/m2 (original dose 80 mg/m2, Cycle 5, Reason: Provider Judgment), 50 mg/m2 (original dose 80 mg/m2, Cycle 6, Reason: Provider Judgment), 65 mg/m2 (original dose 80 mg/m2, Cycle 6, Reason: Provider Judgment) Administration: 132 mg (02/17/2019), 108 mg (02/24/2019), 84 mg (03/31/2019), 84 mg (04/07/2019), 108 mg (04/14/2019), 108 mg (04/21/2019), 108 mg (04/28/2019), 108 mg (05/05/2019), 108 mg (05/12/2019), 108 mg (05/19/2019), 108 mg (05/26/2019), 84 mg (03/24/2019) fosaprepitant (EMEND) 150 mg, dexamethasone (DECADRON) 12 mg in sodium chloride 0.9 % 145 mL IVPB, , Intravenous,  Once, 7 of 7 cycles Administration:  (12/23/2018),  (02/17/2019),  (01/06/2019),  (01/20/2019),  (02/02/2019),  (03/31/2019),  (04/21/2019)  for chemotherapy treatment.      CURRENT THERAPY: s/p chemotherapy  INTERVAL HISTORY: SGeneva Barrero61y.o. female returns for evaluation of skin rash.  This first started about a week after she completed chemotherapy.  She completed Taxol on 05/26/2019.  She only had a couple of areas on her arm and chest, and then the rash worsened.  She  says the rash is mildly itchy and feels more like a sunburn than anything.  She called Dr. Lindi Adie last week and was prescribed Valtrex.  The rash is now worse, and has spread.  It is now on her forehead, bilateral forearms, chest wall, and  upper back.  She has been using a new body wash for the past several weeks.  She is not using a new detergent and has not been on any new medications.  She notes her eyelashes and eyebrows are falling out again. She lives at home with her husband and notes that he does not have a rash.   Patient Active Problem List   Diagnosis Date Noted  . Port-A-Cath in place 03/23/2019  . Genetic testing 12/15/2018  . Family history of breast cancer   . Malignant neoplasm of lower-outer quadrant of left breast of female, estrogen receptor negative ([place]) 12/07/2018  . Acute appendicitis with localized peritonitis 08/05/2015    has No Known Allergies.  MEDICAL HISTORY: Past Medical History:  Diagnosis Date  . Breast cancer ([place]) 2020   Left  . Cancer ([place]) 11/2018   left breast ICD  . Family history of breast cancer     SURGICAL HISTORY: Past Surgical History:  Procedure Laterality Date  . CESAREAN SECTION    . LAPAROSCOPIC APPENDECTOMY N/A 08/05/2015   Procedure: APPENDECTOMY LAPAROSCOPIC;  Surgeon: Mickeal Skinner, MD;  Location: [facility];  Service: General;  Laterality: N/A;  . PORTACATH PLACEMENT Left 12/22/2018   Procedure: INSERTION PORT-A-CATH;  Surgeon: [name], MD;  Location: [facility];  Service: General;  Laterality: Left;    SOCIAL HISTORY: Social History   Socioeconomic History  . Marital status: Married    Spouse name: Not on file  . Number of children: Not on file  . Years of education: Not on file  . Highest education level: Not on file  Occupational History  . Not on file  Tobacco Use  . Smoking status: Former Research scientist (life sciences)  . Smokeless tobacco: Never Used  Substance and Sexual Activity  . Alcohol use: Yes    Comment: "once a month"  . Drug use: No  . Sexual activity: Not on file  Other Topics Concern  . Not on file  Social History Narrative  . Not on file   Social Determinants of Health   Financial Resource Strain:   . Difficulty of Paying  Living Expenses:   Food Insecurity:   . Worried About Charity fundraiser in the Last Year:   . Arboriculturist in the Last Year:   Transportation Needs:   . Film/video editor (Medical):   Marland Kitchen Lack of Transportation (Non-Medical):   Physical Activity:   . Days of Exercise per Week:   . Minutes of Exercise per Session:   Stress:   . Feeling of Stress :   Social Connections:   . Frequency of Communication with Friends and Family:   . Frequency of Social Gatherings with Friends and Family:   . Attends Religious Services:   . Active Member of Clubs or Organizations:   . Attends Archivist Meetings:   Marland Kitchen Marital Status:   Intimate Partner Violence:   . Fear of Current or Ex-Partner:   . Emotionally Abused:   Marland Kitchen Physically Abused:   . Sexually Abused:     FAMILY HISTORY: Family History  Problem Relation Age of Onset  . Breast cancer Cousin        first cousin  once removed (father's cousin)    Review of Systems  Constitutional: Negative for appetite change, chills, fatigue, fever and unexpected weight change.  Respiratory: Negative for cough and shortness of breath.   Cardiovascular: Negative for chest pain, leg swelling and palpitations.  Musculoskeletal: Negative for arthralgias.      PHYSICAL EXAMINATION  ECOG PERFORMANCE STATUS: 1 - Symptomatic but completely ambulatory  Vitals:   06/28/19 0949  BP: 136/81  Pulse: 89  Resp: 17  Temp: 98.7 F (37.1 C)  SpO2: 100%    Physical Exam Constitutional:      General: She is not in acute distress.    Appearance: Normal appearance. She is not toxic-appearing.  HENT:     Head: Normocephalic and atraumatic.  Eyes:     General: No scleral icterus. Cardiovascular:     Rate and Rhythm: Normal rate and regular rhythm.     Pulses: Normal pulses.     Heart sounds: Normal heart sounds.  Pulmonary:     Effort: Pulmonary effort is normal.     Breath sounds: Normal breath sounds.  Abdominal:     General:  Abdomen is flat.     Palpations: Abdomen is soft.  Musculoskeletal:        General: No swelling.     Cervical back: Neck supple.  Lymphadenopathy:     Cervical: No cervical adenopathy.  Skin:    General: Skin is warm.     Capillary Refill: Capillary refill takes less than 2 seconds.     Comments: Rash on chest wall, and upper back, maculopapular and scaling noted, on right forearm it is erythematous and patch like, with asymmetrical borders, and on the right two toes, there are to small erythematous papular lesions.    Neurological:     General: No focal deficit present.     Mental Status: She is alert.  Psychiatric:        Mood and Affect: Mood normal.        Behavior: Behavior normal.     LABORATORY DATA:  CBC    Component Value Date/Time   WBC 3.0 (L) 05/26/2019 1000   WBC 9.4 08/06/2015 0520   RBC 3.37 (L) 05/26/2019 1000   HGB 11.0 (L) 05/26/2019 1000   HCT 33.5 (L) 05/26/2019 1000   PLT 172 05/26/2019 1000   MCV 99.4 05/26/2019 1000   MCH 32.6 05/26/2019 1000   MCHC 32.8 05/26/2019 1000   RDW 14.4 05/26/2019 1000   LYMPHSABS 0.8 05/26/2019 1000   MONOABS 0.3 05/26/2019 1000   EOSABS 0.1 05/26/2019 1000   BASOSABS 0.0 05/26/2019 1000    CMP     Component Value Date/Time   NA 141 05/26/2019 1000   K 3.7 05/26/2019 1000   CL 104 05/26/2019 1000   CO2 23 05/26/2019 1000   GLUCOSE 131 (H) 05/26/2019 1000   BUN 11 05/26/2019 1000   CREATININE 0.72 05/26/2019 1000   CALCIUM 9.2 05/26/2019 1000   PROT 6.8 05/26/2019 1000   ALBUMIN 3.5 05/26/2019 1000   AST 27 05/26/2019 1000   ALT 23 05/26/2019 1000   ALKPHOS 100 05/26/2019 1000   BILITOT <0.2 (L) 05/26/2019 1000   GFRNONAA >60 05/26/2019 1000   GFRAA >60 05/26/2019 1000      ASSESSMENT and THERAPY PLAN:   Malignant neoplasm of lower-outer quadrant of left breast of female, estrogen receptor negative ([place]) 12/07/2018:Patient palpated a lump in the left breast. Mammogram showed a 1.4cm mass at the 5  o'clock position in  the left breast, a left axillary lymph node measuring 2.8cm, and 2 smaller axillary lymph nodes with cortical thickening. 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 Neoadjuvant chemotherapy with Adriamycin and Cytoxan dose dense 4 followed byTaxolweekly 12with carboplatin every 3 weeks--carbo eliminated due to cytopenias completed 05/26/2019 2. Followed by breast conserving surgery withaxillary lymph nodedissection(6 lymph nodes were found on MRI) 3. Followed by adjuvant radiation therapy  Breast MRI on 12/21/18 showed the known 1.5cm mass in the left breast, at least 6 abnormal left axillary lymph nodes, and no right breast malignancy -------------------------------------------------------------------------------------------------------------------------------------------------- Breast MRI 05/29/2019: Complete imaging resolution consistent with excellent response to chemotherapy. Radiology review: I discussed with her that the MRI breast showed fantastic response to chemo.  Corisa is here for skin rash of unknown etiology. I recommended that she stop using her new soap.  I placed orders for her to use lotrisone cream.  I placed a referral to Dr. Denna Haggard.  I also let Dr. Barry Dienes know about the rash and whether or not she may be able to biopsy it during surgery.   Azie will let us know if the rash worsens.  We will see her back on 07/11/2019 after her surgery.     Orders Placed This Encounter  Procedures  . Ambulatory referral to Dermatology    Referral Priority:   Urgent    Referral Type:   Consultation    Referral Reason:   Specialty Services Required    Referred to Provider:   Lavonna Monarch, MD    Requested Specialty:   Dermatology    Number of Visits Requested:   1    All questions were answered. The patient knows to call the clinic with any problems, questions or concerns. We can  certainly see the patient much sooner if necessary.  Total encounter time: 20 minutes*  Wilber Bihari, NP 06/28/19 10:55 AM Medical Oncology and Hematology [facility] [place], [place] [address] Tel. [phone]    Fax. [phone]  *Total Encounter Time as defined by the Centers for Medicare and Medicaid Services includes, in addition to the face-to-face time of a patient visit (documented in the note above) non-face-to-face time: obtaining and reviewing outside history, ordering and reviewing medications, tests or procedures, care coordination (communications with other health care professionals or caregivers) and documentation in the medical record.

## 2019-06-28 NOTE — Assessment & Plan Note (Signed)
12/07/2018:Patient palpated a lump in the left breast. Mammogram showed a 1.4cm mass at the 5 o'clock position in the left breast, a left axillary lymph node measuring 2.8cm, and 2 smaller axillary lymph nodes with cortical thickening. 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 Neoadjuvant chemotherapy with Adriamycin and Cytoxan dose dense 4 followed byTaxolweekly 12with carboplatin every 3 weeks--carbo eliminated due to cytopenias completed 05/26/2019 2. Followed by breast conserving surgery withaxillary lymph nodedissection(6 lymph nodes were found on MRI) 3. Followed by adjuvant radiation therapy  Breast MRI on 12/21/18 showed the known 1.5cm mass in the left breast, at least 6 abnormal left axillary lymph nodes, and no right breast malignancy -------------------------------------------------------------------------------------------------------------------------------------------------- Breast MRI 05/29/2019: Complete imaging resolution consistent with excellent response to chemotherapy. Radiology review: I discussed with her that the MRI breast showed fantastic response to chemo.  Heather Hebert is here for skin rash of unknown etiology. I recommended that she stop using her new soap.  I placed orders for her to use lotrisone cream.  I placed a referral to Dr. Denna Haggard.  I also let Dr. Barry Dienes know about the rash and whether or not she may be able to biopsy it during surgery.   Nita will let us know if the rash worsens.  We will see her back on 07/11/2019 after her surgery.

## 2019-06-29 ENCOUNTER — Telehealth: Payer: Self-pay | Admitting: Adult Health

## 2019-06-29 NOTE — Telephone Encounter (Signed)
No 5/19 los. No changes made to pt's schedule.

## 2019-06-30 ENCOUNTER — Encounter (HOSPITAL_COMMUNITY)
Admission: RE | Admit: 2019-06-30 | Discharge: 2019-06-30 | Disposition: A | Discharge status: Home / Self Care | Payer: Commercial Managed Care - PPO | Source: Ambulatory Visit | Attending: General Surgery | Admitting: General Surgery

## 2019-06-30 ENCOUNTER — Other Ambulatory Visit: Payer: Self-pay

## 2019-06-30 ENCOUNTER — Other Ambulatory Visit (HOSPITAL_COMMUNITY)
Admission: RE | Admit: 2019-06-30 | Discharge: 2019-06-30 | Disposition: A | Discharge status: Home / Self Care | Payer: Commercial Managed Care - PPO | Source: Ambulatory Visit | Attending: General Surgery | Admitting: General Surgery

## 2019-06-30 ENCOUNTER — Encounter (HOSPITAL_COMMUNITY): Payer: Self-pay

## 2019-06-30 DIAGNOSIS — Z01812 Encounter for preprocedural laboratory examination: Secondary | ICD-10-CM | POA: Insufficient documentation

## 2019-06-30 DIAGNOSIS — Z20822 Contact with and (suspected) exposure to covid-19: Secondary | ICD-10-CM | POA: Insufficient documentation

## 2019-06-30 LAB — CBC
HCT: 39.3 % (ref 36.0–46.0)
Hemoglobin: 12.5 g/dL (ref 12.0–15.0)
MCH: 31.6 pg (ref 26.0–34.0)
MCHC: 31.8 g/dL (ref 30.0–36.0)
MCV: 99.2 fL (ref 80.0–100.0)
Platelets: 167 10*3/uL (ref 150–400)
RBC: 3.96 MIL/uL (ref 3.87–5.11)
RDW: 14.4 % (ref 11.5–15.5)
WBC: 4.5 10*3/uL (ref 4.0–10.5)
nRBC: 0 % (ref 0.0–0.2)

## 2019-06-30 LAB — SARS CORONAVIRUS 2 (TAT 6-24 HRS): SARS Coronavirus 2: NEGATIVE

## 2019-06-30 NOTE — Pre-Procedure Instructions (Signed)
[id number] [place], [place] - [address]Phone: [phone]: [phone]     Your procedure is scheduled on Tuesday, Jul 04, 2019 at 7:30 AM.  [facility] at 5:30 A.M., and check in at the Admitting office.  Call this number if you have problems the morning of surgery:  [phone] Call [phone] you have any questions prior to your surgery date Monday-Friday 8am-4pm    Remember:  Do not eat after midnight the night before your surgery  You may drink clear liquids until 4:30 AM the morning of your surgery.   Clear liquids allowed are: Water, Non-Citrus Juices (without pulp), Carbonated Beverages, Clear Tea, Black Coffee Only, and Gatorade    Please complete your PRE-SURGERY ENSURE that was provided to you by 4:30 AM the morning of surgery.  Please, if able, drink it in one setting. DO NOT SIP.   Take these medicines the morning of surgery with A SIP OF WATER:  cetirizine (ZYRTEC)   IF NEEDED: bismuth subsalicylate (PEPTO BISMOL) Hypertonic Nasal Wash (SINUS RINSE KIT NA) Olopatadine HCl (PATADAY OP)  As of today, STOP taking any Aspirin (unless otherwise instructed by your surgeon) and Aspirin containing products, Aleve, Naproxen, Ibuprofen, Motrin, Advil, Goody's, BC's, all herbal medications (Melatonin), fish oil, and all vitamins (Calcium D, Multivitamin, Probiotic, etc).                      Do not wear jewelry, make up, or nail polish            Do not wear lotions, powders, perfumes, or deodorant.            Do not shave 48 hours prior to surgery.            Do not bring valuables to the hospital.            [facility] or [facility].  Do NOT Smoke (Tobacco/Vapping) or drink Alcohol 24 hours prior to your procedure If you use a CPAP at night, you may bring all equipment for your overnight  stay.   Contacts, glasses, dentures or bridgework may not be worn into surgery.      For patients admitted to the hospital, discharge time will be determined by your treatment team.   Patients discharged the day of surgery will not be allowed to drive home, and someone needs to stay with them for 24 hours.    Special instructions:   [place]- Preparing For Surgery  Before surgery, you can play an important role. Because skin is not sterile, your skin needs to be as free of germs as possible. You can reduce the number of germs on your skin by washing with CHG (chlorahexidine gluconate) Soap before surgery.  CHG is an antiseptic cleaner which kills germs and bonds with the skin to continue killing germs even after washing.    Oral Hygiene is also important to reduce your risk of infection.  Remember - BRUSH YOUR TEETH THE MORNING OF SURGERY WITH YOUR REGULAR TOOTHPASTE  Please do not use if you have an allergy to CHG or antibacterial soaps. If your skin becomes reddened/irritated stop using the CHG.  Do not shave (including legs and underarms) for at least 48 hours prior to first CHG shower. It is OK to shave your face.  Please follow these instructions  carefully.   1. Shower the NIGHT BEFORE SURGERY and the MORNING OF SURGERY with CHG Soap.   2. If you chose to wash your hair, wash your hair first as usual with your normal shampoo.  3. After you shampoo, rinse your hair and body thoroughly to remove the shampoo.  4. Use CHG as you would any other liquid soap. You can apply CHG directly to the skin and wash gently with a scrungie or a clean washcloth.   5. Apply the CHG Soap to your body ONLY FROM THE NECK DOWN.  Do not use on open wounds or open sores. Avoid contact with your eyes, ears, mouth and genitals (private parts). Wash Face and genitals (private parts)  with your normal soap.   6. Wash thoroughly, paying special attention to the area where your surgery will be  performed.  7. Thoroughly rinse your body with warm water from the neck down.  8. DO NOT shower/wash with your normal soap after using and rinsing off the CHG Soap.  9. Pat yourself dry with a CLEAN TOWEL.  10. Wear CLEAN PAJAMAS to bed the night before surgery, wear comfortable clothes the morning of surgery  11. Place CLEAN SHEETS on your bed the night of your first shower and DO NOT SLEEP WITH PETS.   Day of Surgery:   Do not apply any deodorants/lotions.  Please wear clean clothes to the hospital/surgery center.   Remember to brush your teeth WITH YOUR REGULAR TOOTHPASTE.   Please read over the following fact sheets that you were given.

## 2019-06-30 NOTE — Progress Notes (Signed)
PCP - Tobie Lords FNP Cardiologist - Denies Oncologist: Dr. Nicholas Lose  PPM/ICD - Denies  Chest x-ray - 12/22/18 EKG - N/A Stress Test - Denies ECHO - 12/21/18 Cardiac Cath - Denies  Sleep Study - Denies  Pt denies being diabetic.  Blood Thinner Instructions: N/A Aspirin Instructions: N/A  ERAS Protcol - Yes, PRE-SURGERY Ensure  COVID TEST- 06/30/19   Coronavirus Screening  Have you experienced the following symptoms:  Cough yes/no: No Fever (>100.88F)  yes/no: No Runny nose yes/no: No Sore throat yes/no: No Difficulty breathing/shortness of breath  yes/no: No  Have you or a family member traveled in the last 14 days and where? yes/no: No   If the patient indicates "YES" to the above questions, their PAT will be rescheduled to limit the exposure to others and, the surgeon will be notified. THE PATIENT WILL NEED TO BE ASYMPTOMATIC FOR 14 DAYS.   If the patient is not experiencing any of these symptoms, the PAT nurse will instruct them to NOT bring anyone with them to their appointment since they may have these symptoms or traveled as well.   Please remind your patients and families that hospital visitation restrictions are in effect and the importance of the restrictions.     Anesthesia review: Yes, seed implant  Patient denies shortness of breath, fever, cough and chest pain at PAT appointment   All instructions explained to the patient, with a verbal understanding of the material. Patient agrees to go over the instructions while at home for a better understanding. Patient also instructed to self quarantine after being tested for COVID-19. The opportunity to ask questions was provided.

## 2019-06-30 NOTE — Pre-Procedure Instructions (Signed)
Heather Hebert  06/30/2019    Your procedure is scheduled on Tuesday, Jul 04, 2019 at 7:30 AM.   Report to [facility] Entrance "A" Admitting Office at 5:30 AM.   Call this number if you have problems the morning of surgery: [phone]   Questions prior to day of surgery, please call [phone] between 8 & 4 PM.   Remember:  Do not eat food after midnight Monday, 07/03/19.  You may drink clear liquids until 4:30 AM.  Clear liquids allowed are:  Water, Juice (non-citric and without pulp - diabetics please choose diet or no sugar options), Carbonated beverages - (diabetics please choose diet or no sugar options), Clear Tea, Black Coffee only (no creamer, milk or cream including half and half) and Gatorade (diabetics please choose diet or no sugar options)   Drink the Pre-Surgery Ensure drink between 4:15 AM and 4:30 AM the morning of surgery. This is the last liquids that you will have prior to surgery.    Take these medicines the morning of surgery with A SIP OF WATER: Cetirizine (Zyrtec), Pepto-Bismol - if needed, eye drops - if needed, nasal wash - if needed  Stop Herbal medications (Melatonin), Multiple Vitamins and Aspirin containing products as of today prior to surgery. Do not use NSAIDS (Ibuprofen, Aleve, etc) or Fish Oil prior to surgery.   Do not wear jewelry, make-up or nail polish.  Do not wear lotions, powders, perfumes or deodorant.  Do not shave 48 hours prior to surgery.    Do not bring valuables to the hospital.  [facility] is not responsible for any belongings or valuables.  Contacts, dentures or bridgework may not be worn into surgery.  Leave your suitcase in the car.  After surgery it may be brought to your room.  For patients admitted to the hospital, discharge time will be determined by your treatment team.  Patients discharged the day of surgery will not be allowed to drive home.   [place] - Preparing for Surgery  Before surgery, you can play  an important role.  Because skin is not sterile, your skin needs to be as free of germs as possible.  You can reduce the number of germs on you skin by washing with CHG (chlorahexidine gluconate) soap before surgery.  CHG is an antiseptic cleaner which kills germs and bonds with the skin to continue killing germs even after washing.  Oral Hygiene is also important in reducing the risk of infection.  Remember to brush your teeth with your regular toothpaste the morning of surgery.  Please DO NOT use if you have an allergy to CHG or antibacterial soaps.  If your skin becomes reddened/irritated stop using the CHG and inform your nurse when you arrive at Short Stay.  Do not shave (including legs and underarms) for at least 48 hours prior to the first CHG shower.  You may shave your face.  Please follow these instructions carefully:   1.  Shower with CHG Soap the night before surgery and the morning of Surgery.  2.  If you choose to wash your hair, wash your hair first as usual with your normal shampoo.  3.  After you shampoo, rinse your hair and body thoroughly to remove the shampoo. 4.  Use CHG as you would any other liquid soap.  You can apply chg directly to the skin and wash gently with a      scrungie or washcloth.  5.  Apply the CHG Soap to your body ONLY FROM THE NECK DOWN.   Do not use on open wounds or open sores. Avoid contact with your eyes, ears, mouth and genitals (private parts).  Wash genitals (private parts) with your normal soap - do this prior to using CHG soap.  6.  Wash thoroughly, paying special attention to the area where your surgery will be performed.  7.  Thoroughly rinse your body with warm water from the neck down.  8.  DO NOT shower/wash with your normal soap after using and rinsing off the CHG Soap.  9.  Pat yourself dry with a clean towel.            10.  Wear clean pajamas.            11.  Place clean sheets on your bed the night of your first shower and do not  sleep with pets.  Day of Surgery  Shower as above. Do not apply any lotions/deodorants the morning of surgery.   Please wear clean clothes to the hospital. Remember to brush your teeth with toothpaste.  Please read over the fact sheets that you were given.

## 2019-07-03 ENCOUNTER — Encounter: Payer: Self-pay | Admitting: Adult Health

## 2019-07-03 ENCOUNTER — Other Ambulatory Visit: Payer: Self-pay

## 2019-07-03 ENCOUNTER — Ambulatory Visit
Admission: RE | Admit: 2019-07-03 | Discharge: 2019-07-03 | Disposition: A | Discharge status: Home / Self Care | Payer: Commercial Managed Care - PPO | Source: Ambulatory Visit | Attending: General Surgery | Admitting: General Surgery

## 2019-07-03 DIAGNOSIS — C50512 Malignant neoplasm of lower-outer quadrant of left female breast: Secondary | ICD-10-CM

## 2019-07-03 DIAGNOSIS — Z171 Estrogen receptor negative status [ER-]: Secondary | ICD-10-CM

## 2019-07-03 NOTE — H&P (Signed)
Heather Hebert Appointment: 06/05/2019 2:15 PM Location: [facility] Patient #: [id number] DOB: Jul 27, 1960 Married / Language: [name] / Race: White Female   History of Present Illness [name] MD; 06/06/2019 12:40 AM) The patient is a 59 year old female who presents for a follow-up for Breast cancer. Pt is a 59 yo F who was referred for consultation by Dr Arceo/Dr. Ouida Sills for new diagnosis of left breast cancer 11/2018. She found a mass on self exam. She then had diagnostic imaging that showed a 1.4 cm mass at 5 o'clock by mammo and 2.8 cm on ultrasound. 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 She has a paternal cousin with breast cancer diagnosed in her early 29s.   She has just retired. She is a former smoker. She drinks some alcohol, but around 2-3 glasses per week. She had menarche at age 16. She underwent menopause around 10 years ago. She is a Advertising copywriter. She used hormonal contraception for around 13 years. She is up to date with colonoscopy.  Pt is [place] from near [place], [place].   She has been receiving neoadjuvant chemotherapy. She has had complete imaging response of tumor. She did not have significant issues with chemotherapy. She received AC + T. She had to have the platin dropped due to her blood counts. She has had complete imaging response. She can no longer palpate tumor.   pathology 12/05/2018 1. Breast, left, needle core biopsy, 5 o'clock - INVASIVE DUCTAL CARCINOMA - LYMPHOVASCULAR INVASION IS IDENTIFIED. - SEE COMMENT. 2. Lymph node, needle/core biopsy, left axilla - METASTATIC CARCINOMA IN 1 OF 1 LYMPH NODE (1/1). Microscopic Comment 1. The carcinoma appears grade III. The tumor cells are NEGATIVE for Her2 (0). Estrogen Receptor: 0%, NEGATIVE Progesterone Receptor: 0%, NEGATIVE Proliferation Marker Ki67: 80%  Labs: 12/14/2018 CBC, CMET essentially  normal.    Allergies Emeline Gins, CMA; 06/05/2019 2:15 PM) No Known Drug Allergies  [08/21/2015]: Allergies Reconciled   Medication History Emeline Gins, CMA; 06/05/2019 2:15 PM) Multiple Vitamin (Oral) Specific strength unknown - Active. Medications Reconciled    Review of Systems [name] MD; 06/06/2019 12:40 AM) All other systems negative  Vitals Emeline Gins CMA; 06/05/2019 2:15 PM) 06/05/2019 2:15 PM Weight: 133.8 lb Height: 62in Body Surface Area: 1.61 m Body Mass Index: 24.47 kg/m  Temp.: 98.31F  Pulse: 108 (Regular)  BP: 120/78(Sitting, Left Arm, Standard)       Physical Exam [name] MD; 06/06/2019 12:41 AM) General Mental Status-Alert. General Appearance-Consistent with stated age. Hydration-Well hydrated. Voice-Normal.  Integumentary Note: alopecia   Head and Neck Head-normocephalic, atraumatic with no lesions or palpable masses.  Eye Sclera/Conjunctiva - Bilateral-No scleral icterus.  Chest and Lung Exam Chest and lung exam reveals -quiet, even and easy respiratory effort with no use of accessory muscles. Inspection Chest Wall - Normal. Back - normal.  Breast Note: symmetric bilaterally. no palpable mass. no LAD. no nipple retraction. no skin dimpling.   Cardiovascular Cardiovascular examination reveals -normal pedal pulses bilaterally. Note: regular rate and rhythm  Abdomen Inspection-Inspection Normal. Palpation/Percussion Palpation and Percussion of the abdomen reveal - Soft, Non Tender, No Rebound tenderness, No Rigidity (guarding) and No hepatosplenomegaly.  Peripheral Vascular Upper Extremity Inspection - Bilateral - Normal - No Clubbing, No Cyanosis, No Edema, Pulses Intact. Lower Extremity Palpation - Edema - Bilateral - No edema - Bilateral.  Neurologic Neurologic evaluation reveals -alert and oriented x 3 with no impairment of recent or  remote memory. Mental  Status-Normal.  Musculoskeletal Global Assessment -Note: no gross deformities.  Normal Exam - Left-Upper Extremity Strength Normal and Lower Extremity Strength Normal. Normal Exam - Right-Upper Extremity Strength Normal and Lower Extremity Strength Normal.  Lymphatic Head & Neck  General Head & Neck Lymphatics: Bilateral - Description - Normal. Axillary  General Axillary Region: Bilateral - Description - Normal. Tenderness - Non Tender.    Assessment & Plan [name] MD; 06/06/2019 12:43 AM) MALIGNANT NEOPLASM OF LOWER-OUTER QUADRANT OF LEFT BREAST OF FEMALE, ESTROGEN RECEPTOR NEGATIVE (C50.512) Impression: We had hoped previously to do a targeted node dissection, but since she had 6 abnormal nodes on MR, she will need ALND. Will plan seed localized lumpectomy and ALND, port removal.  The surgical procedure was described to the patient. I discussed the incision type and location and that we would need radiology involved on with a wire or seed marker and/or sentinel node.  The risks and benefits of the procedure were described to the patient and she wishes to proceed.  We discussed the risks bleeding, infection, damage to other structures, need for further procedures/surgeries. We discussed the risk of seroma. The patient was advised if the area in the breast in cancer, we may need to go back to surgery for additional tissue to obtain negative margins or for a lymph node biopsy. The patient was advised that these are the most common complications, but that others can occur as well. They were advised against taking aspirin or other anti-inflammatory agents/blood thinners the week before surgery. Current Plans You are being scheduled for surgery- Our schedulers will call you.  You should hear from our office's scheduling department within 5 working days about the location, date, and time of surgery. We try to make accommodations for patient's preferences in scheduling  surgery, but sometimes the OR schedule or the surgeon's schedule prevents [place] from making those accommodations.  If you have not heard from our office [phone]) in 5 working days, call the office and ask for your surgeon's nurse.  If you have other questions about your diagnosis, plan, or surgery, call the office and ask for your surgeon's nurse.  Pt Education - flb breast cancer surgery: discussed with patient and provided information.   Signed electronically by [name], MD (06/06/2019 12:43 AM)

## 2019-07-04 ENCOUNTER — Ambulatory Visit (HOSPITAL_COMMUNITY): Payer: Commercial Managed Care - PPO | Admitting: Anesthesiology

## 2019-07-04 ENCOUNTER — Ambulatory Visit (HOSPITAL_COMMUNITY): Payer: Commercial Managed Care - PPO | Admitting: Emergency Medicine

## 2019-07-04 ENCOUNTER — Encounter (HOSPITAL_COMMUNITY)
Admission: RE | Disposition: A | Discharge status: Home / Self Care | Payer: Self-pay | Source: Home / Self Care | Attending: General Surgery

## 2019-07-04 ENCOUNTER — Ambulatory Visit
Admission: RE | Admit: 2019-07-04 | Discharge: 2019-07-04 | Disposition: A | Discharge status: Home / Self Care | Payer: Commercial Managed Care - PPO | Source: Ambulatory Visit | Attending: General Surgery | Admitting: General Surgery

## 2019-07-04 ENCOUNTER — Ambulatory Visit (HOSPITAL_COMMUNITY)
Admission: RE | Admit: 2019-07-04 | Discharge: 2019-07-04 | Disposition: A | Discharge status: Home / Self Care | Payer: Commercial Managed Care - PPO | Attending: General Surgery | Admitting: General Surgery

## 2019-07-04 ENCOUNTER — Encounter (HOSPITAL_COMMUNITY): Payer: Self-pay | Admitting: General Surgery

## 2019-07-04 ENCOUNTER — Other Ambulatory Visit: Payer: Self-pay

## 2019-07-04 DIAGNOSIS — C50512 Malignant neoplasm of lower-outer quadrant of left female breast: Secondary | ICD-10-CM | POA: Insufficient documentation

## 2019-07-04 DIAGNOSIS — Z803 Family history of malignant neoplasm of breast: Secondary | ICD-10-CM | POA: Diagnosis not present

## 2019-07-04 DIAGNOSIS — Z171 Estrogen receptor negative status [ER-]: Secondary | ICD-10-CM | POA: Diagnosis not present

## 2019-07-04 DIAGNOSIS — Z9221 Personal history of antineoplastic chemotherapy: Secondary | ICD-10-CM | POA: Diagnosis not present

## 2019-07-04 DIAGNOSIS — Z87891 Personal history of nicotine dependence: Secondary | ICD-10-CM | POA: Insufficient documentation

## 2019-07-04 HISTORY — PX: PUNCH BIOPSY OF SKIN: SHX6390

## 2019-07-04 HISTORY — PX: PORT-A-CATH REMOVAL: SHX5289

## 2019-07-04 HISTORY — PX: BREAST LUMPECTOMY WITH RADIOACTIVE SEED AND AXILLARY LYMPH NODE DISSECTION: SHX6656

## 2019-07-04 SURGERY — BREAST LUMPECTOMY WITH RADIOACTIVE SEED AND AXILLARY LYMPH NODE DISSECTION
Anesthesia: General | Site: Chest | Laterality: Right

## 2019-07-04 MED ORDER — SCOPOLAMINE 1 MG/3DAYS TD PT72
1.0000 | MEDICATED_PATCH | TRANSDERMAL | Status: DC
  Administered 2019-07-04: 1.5 mg via TRANSDERMAL
  Filled 2019-07-04: qty 1

## 2019-07-04 MED ORDER — FENTANYL CITRATE (PF) 100 MCG/2ML IJ SOLN
INTRAMUSCULAR | Status: DC | PRN
  Administered 2019-07-04 (×3): 50 ug via INTRAVENOUS

## 2019-07-04 MED ORDER — CEFAZOLIN SODIUM-DEXTROSE 2-4 GM/100ML-% IV SOLN
2.0000 g | INTRAVENOUS | Status: AC
  Administered 2019-07-04: 2 g via INTRAVENOUS
  Filled 2019-07-04: qty 100

## 2019-07-04 MED ORDER — DEXAMETHASONE SODIUM PHOSPHATE 10 MG/ML IJ SOLN
INTRAMUSCULAR | Status: AC
  Filled 2019-07-04: qty 1

## 2019-07-04 MED ORDER — SODIUM CHLORIDE (PF) 0.9 % IJ SOLN
INTRAMUSCULAR | Status: AC
  Filled 2019-07-04: qty 10

## 2019-07-04 MED ORDER — CHLORHEXIDINE GLUCONATE CLOTH 2 % EX PADS: 6.0000 | MEDICATED_PAD | Freq: Once | CUTANEOUS | Status: DC

## 2019-07-04 MED ORDER — STERILE WATER FOR IRRIGATION IR SOLN
Status: DC | PRN
  Administered 2019-07-04: 1000 mL

## 2019-07-04 MED ORDER — ONDANSETRON HCL 4 MG/2ML IJ SOLN
INTRAMUSCULAR | Status: DC | PRN
  Administered 2019-07-04: 4 mg via INTRAVENOUS

## 2019-07-04 MED ORDER — SODIUM CHLORIDE 0.9 % IV SOLN
INTRAVENOUS | Status: DC | PRN
  Administered 2019-07-04: 20 ug/min via INTRAVENOUS

## 2019-07-04 MED ORDER — ONDANSETRON HCL 4 MG/2ML IJ SOLN
INTRAMUSCULAR | Status: AC
  Filled 2019-07-04: qty 2

## 2019-07-04 MED ORDER — BUPIVACAINE HCL (PF) 0.25 % IJ SOLN
INTRAMUSCULAR | Status: AC
  Filled 2019-07-04: qty 20

## 2019-07-04 MED ORDER — FENTANYL CITRATE (PF) 250 MCG/5ML IJ SOLN
INTRAMUSCULAR | Status: AC
  Filled 2019-07-04: qty 5

## 2019-07-04 MED ORDER — ACETAMINOPHEN 500 MG PO TABS
1000.0000 mg | ORAL_TABLET | ORAL | Status: AC
  Administered 2019-07-04: 1000 mg via ORAL
  Filled 2019-07-04: qty 2

## 2019-07-04 MED ORDER — ROCURONIUM BROMIDE 10 MG/ML (PF) SYRINGE
PREFILLED_SYRINGE | INTRAVENOUS | Status: AC
  Filled 2019-07-04: qty 10

## 2019-07-04 MED ORDER — MEPERIDINE HCL 25 MG/ML IJ SOLN
6.2500 mg | INTRAMUSCULAR | Status: DC | PRN
  Administered 2019-07-04: 12 mg via INTRAVENOUS

## 2019-07-04 MED ORDER — LACTATED RINGERS IV SOLN: INTRAVENOUS | Status: DC | PRN

## 2019-07-04 MED ORDER — ROCURONIUM BROMIDE 10 MG/ML (PF) SYRINGE
PREFILLED_SYRINGE | INTRAVENOUS | Status: DC | PRN
  Administered 2019-07-04: 50 mg via INTRAVENOUS

## 2019-07-04 MED ORDER — FENTANYL CITRATE (PF) 100 MCG/2ML IJ SOLN: 25.0000 ug | INTRAMUSCULAR | Status: DC | PRN

## 2019-07-04 MED ORDER — 0.9 % SODIUM CHLORIDE (POUR BTL) OPTIME
TOPICAL | Status: DC | PRN
  Administered 2019-07-04: 1000 mL

## 2019-07-04 MED ORDER — METHYLENE BLUE 0.5 % INJ SOLN
INTRAVENOUS | Status: AC
  Filled 2019-07-04: qty 10

## 2019-07-04 MED ORDER — OXYCODONE HCL 5 MG/5ML PO SOLN: 5.0000 mg | Freq: Once | ORAL | Status: AC | PRN

## 2019-07-04 MED ORDER — PROPOFOL 10 MG/ML IV BOLUS
INTRAVENOUS | Status: AC
  Filled 2019-07-04: qty 20

## 2019-07-04 MED ORDER — LIDOCAINE 2% (20 MG/ML) 5 ML SYRINGE
INTRAMUSCULAR | Status: AC
  Filled 2019-07-04: qty 5

## 2019-07-04 MED ORDER — SUGAMMADEX SODIUM 200 MG/2ML IV SOLN
INTRAVENOUS | Status: DC | PRN
  Administered 2019-07-04: 200 mg via INTRAVENOUS

## 2019-07-04 MED ORDER — OXYCODONE HCL 5 MG PO TABS
ORAL_TABLET | ORAL | Status: AC
  Filled 2019-07-04: qty 1

## 2019-07-04 MED ORDER — PHENYLEPHRINE 40 MCG/ML (10ML) SYRINGE FOR IV PUSH (FOR BLOOD PRESSURE SUPPORT)
PREFILLED_SYRINGE | INTRAVENOUS | Status: AC
  Filled 2019-07-04: qty 10

## 2019-07-04 MED ORDER — LIDOCAINE-EPINEPHRINE 1 %-1:100000 IJ SOLN
INTRAMUSCULAR | Status: DC | PRN
  Administered 2019-07-04: 40 mL via INTRAMUSCULAR

## 2019-07-04 MED ORDER — MIDAZOLAM HCL 2 MG/2ML IJ SOLN
INTRAMUSCULAR | Status: AC
  Filled 2019-07-04: qty 2

## 2019-07-04 MED ORDER — LIDOCAINE 2% (20 MG/ML) 5 ML SYRINGE
INTRAMUSCULAR | Status: DC | PRN
  Administered 2019-07-04: 50 mg via INTRAVENOUS

## 2019-07-04 MED ORDER — MEPERIDINE HCL 25 MG/ML IJ SOLN
INTRAMUSCULAR | Status: AC
  Filled 2019-07-04: qty 1

## 2019-07-04 MED ORDER — MIDAZOLAM HCL 5 MG/5ML IJ SOLN
INTRAMUSCULAR | Status: DC | PRN
  Administered 2019-07-04: 2 mg via INTRAVENOUS

## 2019-07-04 MED ORDER — PROPOFOL 10 MG/ML IV BOLUS
INTRAVENOUS | Status: DC | PRN
  Administered 2019-07-04: 130 mg via INTRAVENOUS

## 2019-07-04 MED ORDER — ENSURE PRE-SURGERY PO LIQD: 296.0000 mL | Freq: Once | ORAL | Status: DC

## 2019-07-04 MED ORDER — PHENYLEPHRINE HCL (PRESSORS) 10 MG/ML IV SOLN
INTRAVENOUS | Status: DC | PRN
  Administered 2019-07-04 (×2): 80 ug via INTRAVENOUS

## 2019-07-04 MED ORDER — OXYCODONE HCL 5 MG PO TABS
5.0000 mg | ORAL_TABLET | Freq: Once | ORAL | Status: AC | PRN
  Administered 2019-07-04: 5 mg via ORAL

## 2019-07-04 MED ORDER — OXYCODONE HCL 5 MG PO TABS: 5.0000 mg | ORAL_TABLET | Freq: Four times a day (QID) | ORAL | 0 refills | Status: DC | PRN

## 2019-07-04 MED ORDER — CHLORHEXIDINE GLUCONATE 0.12 % MT SOLN
15.0000 mL | Freq: Once | OROMUCOSAL | Status: AC
  Administered 2019-07-04: 15 mL via OROMUCOSAL
  Filled 2019-07-04: qty 15

## 2019-07-04 MED ORDER — ONDANSETRON HCL 4 MG/2ML IJ SOLN: 4.0000 mg | Freq: Once | INTRAMUSCULAR | Status: DC | PRN

## 2019-07-04 MED ORDER — LIDOCAINE-EPINEPHRINE (PF) 1 %-1:200000 IJ SOLN
INTRAMUSCULAR | Status: AC
  Filled 2019-07-04: qty 30

## 2019-07-04 MED ORDER — DEXAMETHASONE SODIUM PHOSPHATE 10 MG/ML IJ SOLN
INTRAMUSCULAR | Status: DC | PRN
  Administered 2019-07-04: 10 mg via INTRAVENOUS

## 2019-07-04 MED ORDER — ORAL CARE MOUTH RINSE: 15.0000 mL | Freq: Once | OROMUCOSAL | Status: AC

## 2019-07-04 SURGICAL SUPPLY — 45 items
BINDER BREAST LRG (GAUZE/BANDAGES/DRESSINGS) ×2 IMPLANT
CHLORAPREP W/TINT 10.5 ML (MISCELLANEOUS) ×5 IMPLANT
CLIP VESOCCLUDE LG 6/CT (CLIP) ×4 IMPLANT
CLIP VESOCCLUDE MED 24/CT (CLIP) ×2 IMPLANT
CLIP VESOCCLUDE SM WIDE 24/CT (CLIP) ×2 IMPLANT
CLOSURE STERI-STRIP 1/2X4 (GAUZE/BANDAGES/DRESSINGS) ×1
CLSR STERI-STRIP ANTIMIC 1/2X4 (GAUZE/BANDAGES/DRESSINGS) ×1 IMPLANT
COVER SURGICAL LIGHT HANDLE (MISCELLANEOUS) ×5 IMPLANT
COVER WAND RF STERILE (DRAPES) ×5 IMPLANT
DECANTER SPIKE VIAL GLASS SM (MISCELLANEOUS) ×10 IMPLANT
DERMABOND ADVANCED (GAUZE/BANDAGES/DRESSINGS) ×2
DERMABOND ADVANCED .7 DNX12 (GAUZE/BANDAGES/DRESSINGS) ×3 IMPLANT
DRAIN CHANNEL 19F RND (DRAIN) ×2 IMPLANT
DRAPE LAPAROTOMY 100X72 PEDS (DRAPES) ×5 IMPLANT
ELECT CAUTERY BLADE 6.4 (BLADE) ×5 IMPLANT
ELECT REM PT RETURN 9FT ADLT (ELECTROSURGICAL) ×5
ELECTRODE REM PT RTRN 9FT ADLT (ELECTROSURGICAL) ×3 IMPLANT
EVACUATOR SILICONE 100CC (DRAIN) ×2 IMPLANT
GAUZE 4X4 16PLY RFD (DISPOSABLE) ×5 IMPLANT
GAUZE SPONGE 4X4 12PLY STRL (GAUZE/BANDAGES/DRESSINGS) ×2 IMPLANT
GLOVE BIO SURGEON STRL SZ 6 (GLOVE) ×5 IMPLANT
GLOVE INDICATOR 6.5 STRL GRN (GLOVE) ×5 IMPLANT
GOWN STRL REUS W/ TWL LRG LVL3 (GOWN DISPOSABLE) ×3 IMPLANT
GOWN STRL REUS W/TWL 2XL LVL3 (GOWN DISPOSABLE) ×5 IMPLANT
GOWN STRL REUS W/TWL LRG LVL3 (GOWN DISPOSABLE) ×2
KIT BASIN OR (CUSTOM PROCEDURE TRAY) ×5 IMPLANT
KIT MARKER MARGIN INK (KITS) ×2 IMPLANT
KIT TURNOVER KIT B (KITS) ×5 IMPLANT
LIGHT WAVEGUIDE WIDE FLAT (MISCELLANEOUS) ×2 IMPLANT
NDL HYPO 25GX1X1/2 BEV (NEEDLE) ×3 IMPLANT
NEEDLE HYPO 25GX1X1/2 BEV (NEEDLE) ×5 IMPLANT
NS IRRIG 1000ML POUR BTL (IV SOLUTION) ×5 IMPLANT
PACK GENERAL/GYN (CUSTOM PROCEDURE TRAY) ×5 IMPLANT
PAD ABD 8X10 STRL (GAUZE/BANDAGES/DRESSINGS) ×2 IMPLANT
PAD ARMBOARD 7.5X6 YLW CONV (MISCELLANEOUS) ×10 IMPLANT
PENCIL SMOKE EVACUATOR (MISCELLANEOUS) ×2 IMPLANT
PUNCH BIOPSY 4MM (MISCELLANEOUS) ×5
PUNCH BIOPSY DISP 4 (MISCELLANEOUS) IMPLANT
SUT ETHILON 2 0 FS 18 (SUTURE) ×2 IMPLANT
SUT MON AB 4-0 PC3 18 (SUTURE) ×7 IMPLANT
SUT VIC AB 3-0 SH 27 (SUTURE) ×4
SUT VIC AB 3-0 SH 27X BRD (SUTURE) ×3 IMPLANT
SYR CONTROL 10ML LL (SYRINGE) ×5 IMPLANT
TOWEL GREEN STERILE (TOWEL DISPOSABLE) ×5 IMPLANT
TOWEL GREEN STERILE FF (TOWEL DISPOSABLE) ×5 IMPLANT

## 2019-07-04 NOTE — Op Note (Signed)
Left Breast Radioactive seed localized lumpectomy, axillary lymph node dissection, port removal, and right forearm punch biopsy  Indications: This patient presents with history of left breast cancer, s/p neoadjuvant chemotherapy.    Pre-operative Diagnosis: left breast cancer, lower outer quadrant, cT1cN1M0, Grade 3, -/-/-  Post-operative Diagnosis: Same  Surgeon: [name]   Anesthesia: General endotracheal anesthesia  ASA Class: 2  Procedure Details  The patient was seen in the Holding Room. The risks, benefits, complications, treatment options, and expected outcomes were discussed with the patient. The possibilities of bleeding, infection, the need for additional procedures, failure to diagnose a condition, and creating a complication requiring transfusion or operation were discussed with the patient. The patient concurred with the proposed plan, giving informed consent.  The site of surgery properly noted/marked. The patient was taken to Operating Room # 9, identified, and the procedure verified as Left Breast Seed localized Lumpectomy, axillary lymph node dissection, port removal, and punch biopsy of rash. A Time Out was held and the above information confirmed.  The left arm, breast, and chest were prepped and draped in standard fashion. The port was addressed first. The prior incision was anesthetized with local anesthetic.  The incision was opened with a #15 blade.  The subcutaneous tissue was divided with the cautery.  The port was identified and the capsule opened.  The four 2-0 prolene sutures were removed.  The port was then removed and pressure held on the tract.  The catheter appeared intact without evidence of breakage, length was 21 cm.  The wound was inspected for hemostasis, which was achieved with cautery.  The wound was closed with 3-0 vicryl deep dermal interrupted sutures and 4-0 Monocryl running subcuticular suture.    The lumpectomy was performed by creating an  inframammary incision near the previously placed radioactive seed.  Dissection was carried down to around the point of maximum signal intensity. The cautery was used to perform the dissection.  Hemostasis was achieved with cautery. The edges of the cavity were marked with large clips, with one each medial, lateral, inferior and superior, and posteriorly.   The specimen was inked with the margin marker paint kit.    Specimen radiography confirmed inclusion of the mammographic lesion, the clip, and the seed.  The background signal in the breast was zero.  The wound was irrigated and closed with 3-0 vicryl in layers and 4-0 monocryl subcuticular suture.    An curvalinear incision was made in the axilla. An axillary dissection was performed with removal of the associated lymph nodes and surrounding adipose tissue. This included levels I and II. This was accomplished by exposing the axillary vein anteriorly and inferiorly to the level of the pectoralis minor and laterally over the latissimus dorsi muscle. Posteriorly, the dissection continued to the subscapularis.  Small venous tributaries, lymphatics, and vessels were clipped and ligated or cauterized and divided. The subscapularis muscle was skeletonized. The long thoracic and thoracodorsal neurovascular bundles were identified and preserved.  A 19 Fr blake drain was placed in the axilla and secured with a 2-0 nylon.  The wound was irrigated and closed with a 3-0 Vicryl deep dermal interrupted and a 4-0 vicryl subcuticular closure in layers.  The most prominent rash was on the right forearm.  This was prepped with betadine. A 4 mm punch biopsy was used to take a small specimen.  A single monocryl was used to close the skin.    Sterile dressings were applied. At the end of the operation, all sponge,  instrument, and needle counts were correct.  Findings: grossly clear surgical margins and no adenopathy, lumpectomy margins- posterior margin is pectoralis,  inferior margin is skin.    Estimated Blood Loss:  min         Specimens: left breast lumpectomy with seed and left axillary contents.          Complications:  None; patient tolerated the procedure well.         Disposition: PACU - hemodynamically stable.         Condition: stable

## 2019-07-04 NOTE — Anesthesia Preprocedure Evaluation (Signed)
Anesthesia Evaluation  Patient identified by MRN, date of birth, ID band Patient awake    Reviewed: Allergy & Precautions, NPO status , Patient's Chart, lab work & pertinent test results  Airway Mallampati: II  TM Distance: >3 FB Neck ROM: Full    Dental  (+) Teeth Intact, Dental Advisory Given   Pulmonary former smoker,    breath sounds clear to auscultation       Cardiovascular  Rhythm:Regular Rate:Normal     Neuro/Psych    GI/Hepatic   Endo/Other    Renal/GU      Musculoskeletal   Abdominal   Peds  Hematology   Anesthesia Other Findings   Reproductive/Obstetrics                             Anesthesia Physical Anesthesia Plan  ASA: II  Anesthesia Plan: General   Post-op Pain Management:  Regional for Post-op pain   Induction:   PONV Risk Score and Plan: Ondansetron and Dexamethasone  Airway Management Planned: Oral ETT  Additional Equipment:   Intra-op Plan:   Post-operative Plan: Extubation in OR  Informed Consent: I have reviewed the patients History and Physical, chart, labs and discussed the procedure including the risks, benefits and alternatives for the proposed anesthesia with the patient or authorized representative who has indicated his/her understanding and acceptance.     Dental advisory given  Plan Discussed with: CRNA and Anesthesiologist  Anesthesia Plan Comments:         Anesthesia Quick Evaluation

## 2019-07-04 NOTE — Discharge Instructions (Addendum)
[facility] Surgical drains are used to remove extra fluid that normally builds up in a surgical wound after surgery. A surgical drain helps to heal a surgical wound. Different kinds of surgical drains include:  Active drains. These drains use suction to pull drainage away from the surgical wound. Drainage flows through a tube to a container outside of the body. With these drains, you need to keep the bulb or the drainage container flat (compressed) at all times, except while you empty it. Flattening the bulb or container creates suction.  Passive drains. These drains allow fluid to drain naturally, by gravity. Drainage flows through a tube to a bandage (dressing) or a container outside of the body. Passive drains do not need to be emptied. A drain is placed during surgery. Right after surgery, drainage is usually bright red and a little thicker than water. The drainage may gradually turn yellow or pink and become thinner. It is likely that your health care provider will remove the drain when the drainage stops or when the amount decreases to 1-2 Tbsp (15-30 mL) during a 24-hour period. Supplies needed:  Tape.  Germ-free cleaning solution (sterile saline).  Cotton swabs.  Split gauze drain sponge: 4 x 4 inches (10 x 10 cm).  Gauze square: 4 x 4 inches (10 x 10 cm). How to care for your surgical drain Care for your drain as told by your health care provider. This is important to help prevent infection. If your drain is placed at your back, or any other hard-to-reach area, ask another person to assist you in performing the following tasks: General care  Keep the skin around the drain dry and covered with a dressing at all times.  Check your drain area every day for signs of infection. Check for: ? Redness, swelling, or pain. ? Pus or a bad smell. ? Cloudy drainage. ? Tenderness or pressure at the drain exit site. Changing the dressing Follow instructions from your health care  provider about how to change your dressing. Change your dressing at least once a day. Change it more often if needed to keep the dressing dry. Make sure you: 1. Gather your supplies. 2. Wash your hands with soap and water before you change your dressing. If soap and water are not available, use hand sanitizer. 3. Remove the old dressing. Avoid using scissors to do that. 4. Wash your hands with soap and water again after removing the old dressing. 5. Use sterile saline to clean your skin around the drain. You may need to use a cotton swab to clean the skin. 6. Place the tube through the slit in a drain sponge. Place the drain sponge so that it covers your wound. 7. Place the gauze square or another drain sponge on top of the drain sponge that is on the wound. Make sure the tube is between those layers. 8. Tape the dressing to your skin. 9. Tape the drainage tube to your skin 1-2 inches (2.5-5 cm) below the place where the tube enters your body. Taping keeps the tube from pulling on any stitches (sutures) that you have. 10. Wash your hands with soap and water. 11. Write down the color of your drainage and how often you change your dressing. How to empty your active drain  1. Make sure that you have a measuring cup that you can empty your drainage into. 2. Wash your hands with soap and water. If soap and water are not available, use hand sanitizer. 3.  Loosen any pins or clips that hold the tube in place. 4. If your health care provider tells you to strip the tube to prevent clots and tube blockages: ? Hold the tube at the skin with one hand. Use your other hand to pinch the tubing with your thumb and first finger. ? Gently move your fingers down the tube while squeezing very lightly. This clears any drainage, clots, or tissue from the tube. ? You may need to do this several times each day to keep the tube clear. Do not pull on the tube. 5. Open the bulb cap or the drain plug. Do not touch the  inside of the cap or the bottom of the plug. 6. Turn the device upside down and gently squeeze. 7. Empty all of the drainage into the measuring cup. 8. Compress the bulb or the container and replace the cap or the plug. To compress the bulb or the container, squeeze it firmly in the middle while you close the cap or plug the container. 9. Write down the amount of drainage that you have in each 24-hour period. If you have less than 2 Tbsp (30 mL) of drainage during 24 hours, contact your health care provider. 10. Flush the drainage down the toilet. 11. Wash your hands with soap and water. Contact a health care provider if:  You have redness, swelling, or pain around your drain area.  You have pus or a bad smell coming from your drain area.  You have a fever or chills.  The skin around your drain is warm to the touch.  The amount of drainage that you have is increasing instead of decreasing.  You have drainage that is cloudy.  There is a sudden stop or a sudden decrease in the amount of drainage that you have.  Your drain tube falls out.  Your active drain does not stay compressed after you empty it. Summary  Surgical drains are used to remove extra fluid that normally builds up in a surgical wound after surgery.  Different kinds of surgical drains include active drains and passive drains. Active drains use suction to pull drainage away from the surgical wound, and passive drains allow fluid to drain naturally.  It is important to care for your drain to prevent infection. If your drain is placed at your back, or any other hard-to-reach area, ask another person to assist you.  Contact your health care provider if you have redness, swelling, or pain around your drain area. This information is not intended to replace advice given to you by your health care provider. Make sure you discuss any questions you have with your health care provider. Document Revised: 03/02/2018 Document  Reviewed: 03/02/2018 Elsevier Patient Education  2020 [place] Office Phone Number [phone]   POST OP INSTRUCTIONS  Always review your discharge instruction sheet given to you by the facility where your surgery was performed.  IF YOU HAVE DISABILITY OR FAMILY LEAVE FORMS, YOU MUST BRING THEM TO THE OFFICE FOR PROCESSING.  DO NOT GIVE THEM TO YOUR DOCTOR.  1. A prescription for pain medication may be given to you upon discharge.  Take your pain medication as prescribed, if needed.  If narcotic pain medicine is not needed, then you may take acetaminophen (Tylenol) or ibuprofen (Advil) as needed. 2. Take your usually prescribed medications unless otherwise directed 3. If you need a refill on your pain medication, please contact your pharmacy.  They will contact our office  to request authorization.  Prescriptions will not be filled after 5pm or on week-ends. 4. You should eat very light the first 24 hours after surgery, such as soup, crackers, pudding, etc.  Resume your normal diet the day after surgery 5. It is common to experience some constipation if taking pain medication after surgery.  Increasing fluid intake and taking a stool softener will usually help or prevent this problem from occurring.  A mild laxative (Milk of Magnesia or Miralax) should be taken according to package directions if there are no bowel movements after 48 hours. 6. You may shower in 48 hours.  The surgical glue will flake off in 2-3 weeks.   7. ACTIVITIES:  No strenuous activity or heavy lifting for 1 week.   a. You may drive when you no longer are taking prescription pain medication, you can comfortably wear a seatbelt, and you can safely maneuver your car and apply brakes. b. RETURN TO WORK:  __________to be determined.  As tolerated at 1-4 weeks if no lifting.  _______________ Heather Hebert should see your doctor in the office for a follow-up appointment approximately three-four weeks  after your surgery.    WHEN TO CALL YOUR DOCTOR: 1. Fever over 101.0 2. Nausea and/or vomiting. 3. Extreme swelling or bruising. 4. Continued bleeding from incision. 5. Increased pain, redness, or drainage from the incision.  The clinic staff is available to answer your questions during regular business hours.  Please don't hesitate to call and ask to speak to one of the nurses for clinical concerns.  If you have a medical emergency, go to the nearest emergency room or call 911.  A surgeon from [facility] is always on call at the hospital.  For further questions, please visit [email or web address]

## 2019-07-04 NOTE — Interval H&P Note (Signed)
History and Physical Interval Note:  07/04/2019 7:34 AM  Heather Hebert  has presented today for surgery, with the diagnosis of LEFT BREAST CANCER.  The various methods of treatment have been discussed with the patient and family. After consideration of risks, benefits and other options for treatment, the patient has consented to  Procedure(s) with comments: LEFT BREAST LUMPECTOMY WITH RADIOACTIVE SEED AND AXILLARY LYMPH NODE DISSECTION (Left) - PEC BLOCK REMOVAL PORT-A-CATH (N/A) as a surgical intervention.  The patient's history has been reviewed, patient examined, no change in status, stable for surgery.  I have reviewed the patient's chart and labs.  Questions were answered to the patient's satisfaction.     [name]

## 2019-07-04 NOTE — Transfer of Care (Signed)
Immediate Anesthesia Transfer of Care Note  Patient: Heather Hebert  Procedure(s) Performed: LEFT BREAST LUMPECTOMY WITH RADIOACTIVE SEED AND AXILLARY LYMPH NODE DISSECTION (Left Breast) REMOVAL PORT-A-CATH (Left Chest) Punch Biopsy Of Skin (Right Arm Lower)  Patient Location: PACU  Anesthesia Type:General  Level of Consciousness: drowsy and patient cooperative  Airway & Oxygen Therapy: Patient Spontanous Breathing and Patient connected to face mask oxygen  Post-op Assessment: Report given to RN and Post -op Vital signs reviewed and stable  Post vital signs: Reviewed and stable  Last Vitals:  Vitals Value Taken Time  BP 146/73 07/04/19 0954  Temp    Pulse 82 07/04/19 0955  Resp 0 07/04/19 0955  SpO2 100 % 07/04/19 0955  Vitals shown include unvalidated device data.  Last Pain:  Vitals:   07/04/19 0620  PainSc: 0-No pain      Patients Stated Pain Goal: 2 ([id number])  Complications: No apparent anesthesia complications

## 2019-07-04 NOTE — Anesthesia Procedure Notes (Addendum)
Procedure Name: Intubation Date/Time: 07/04/2019 8:06 AM Performed by: Inda Coke, CRNA Pre-anesthesia Checklist: Patient identified, Emergency Drugs available, Suction available and Patient being monitored Patient Re-evaluated:Patient Re-evaluated prior to induction Oxygen Delivery Method: Circle System Utilized Preoxygenation: Pre-oxygenation with 100% oxygen Induction Type: IV induction Ventilation: Mask ventilation without difficulty and Oral airway inserted - appropriate to patient size Laryngoscope Size: Mac and 3 Grade View: Grade I Tube type: Oral Tube size: 7.0 mm Number of attempts: 1 Airway Equipment and Method: Stylet and Oral airway Placement Confirmation: ETT inserted through vocal cords under direct vision,  positive ETCO2 and breath sounds checked- equal and bilateral Secured at: 21 cm Tube secured with: Tape Dental Injury: Teeth and Oropharynx as per pre-operative assessment

## 2019-07-04 NOTE — Anesthesia Procedure Notes (Signed)
Anesthesia Regional Block: Pectoralis block   Pre-Anesthetic Checklist: ,, timeout performed, Correct Patient, Correct Site, Correct Laterality, Correct Procedure, Correct Position, site marked, Risks and benefits discussed,  Surgical consent,  Pre-op evaluation,  At surgeon's request and post-op pain management  Laterality: Left  Prep: chloraprep       Needles:  Injection technique: Single-shot  Needle Type: Echogenic Needle     Needle Length: 9cm  Needle Gauge: 21     Additional Needles:   Narrative:  Start time: 07/04/2019 7:10 AM End time: 07/04/2019 7:15 AM Injection made incrementally with aspirations every 5 mL.  Performed by: Personally  Anesthesiologist: Roberts Gaudy, MD  Additional Notes: 30 cc 0.25% Bupivacaine 1:200 epi

## 2019-07-04 NOTE — Anesthesia Postprocedure Evaluation (Signed)
Anesthesia Post Note  Patient: Heather Hebert  Procedure(s) Performed: LEFT BREAST LUMPECTOMY WITH RADIOACTIVE SEED AND AXILLARY LYMPH NODE DISSECTION (Left Breast) REMOVAL PORT-A-CATH (Left Chest) Punch Biopsy Of Skin (Right Arm Lower)     Patient location during evaluation: PACU Anesthesia Type: General and Regional Level of consciousness: awake and alert Pain management: pain level controlled Vital Signs Assessment: post-procedure vital signs reviewed and stable Respiratory status: spontaneous breathing, nonlabored ventilation, respiratory function stable and patient connected to nasal cannula oxygen Cardiovascular status: blood pressure returned to baseline and stable Postop Assessment: no apparent nausea or vomiting Anesthetic complications: no    Last Vitals:  Vitals:   07/04/19 1054 07/04/19 1125  BP: (!) 158/86 (!) 149/75  Pulse: 72 79  Resp: 12 18  Temp:  36.6 C  SpO2: 98% 96%    Last Pain:  Vitals:   07/04/19 1125  PainSc: 2                  Anastaisa Wooding COKER

## 2019-07-06 ENCOUNTER — Encounter: Payer: Self-pay | Admitting: *Deleted

## 2019-07-06 LAB — SURGICAL PATHOLOGY

## 2019-07-10 NOTE — Progress Notes (Signed)
Patient Care Team: [name], FNP as PCP - General (Family Medicine) Mauro Kaufmann, RN as Oncology Nurse Navigator Rockwell Germany, RN as Oncology Nurse Navigator  DIAGNOSIS:    ICD-10-CM   1. Malignant neoplasm of lower-outer quadrant of left breast of female, estrogen receptor negative ([place])  C50.512    Z17.1     SUMMARY OF ONCOLOGIC HISTORY: Oncology History  Malignant neoplasm of lower-outer quadrant of left breast of female, estrogen receptor negative ([place])  12/07/2018 Initial Diagnosis   Patient palpated a lump in the left breast. Mammogram showed a 1.4cm mass at the 5 o'clock position in the left breast, a left axillary lymph node measuring 2.8cm, and 2 smaller axillary lymph nodes with cortical thickening. 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   12/14/2018 Cancer Staging   Staging form: Breast, AJCC 8th Edition - Clinical: Stage IIB (cT1c, cN1, cM0, G3, ER-, PR-, HER2-) - Signed by Nicholas Lose, MD on 12/14/2018   12/23/2018 -  Chemotherapy   The patient had DOXOrubicin (ADRIAMYCIN) chemo injection 98 mg, 60 mg/m2 = 98 mg, Intravenous,  Once, 4 of 4 cycles Dose modification: 50 mg/m2 (original dose 60 mg/m2, Cycle 2, Reason: Dose not tolerated) Administration: 98 mg (12/23/2018), 82 mg (01/06/2019), 82 mg (01/20/2019), 82 mg (02/02/2019) palonosetron (ALOXI) injection 0.25 mg, 0.25 mg, Intravenous,  Once, 7 of 7 cycles Administration: 0.25 mg (12/23/2018), 0.25 mg (02/17/2019), 0.25 mg (01/06/2019), 0.25 mg (01/20/2019), 0.25 mg (02/02/2019), 0.25 mg (03/31/2019), 0.25 mg (04/21/2019) pegfilgrastim-jmdb (FULPHILA) injection 6 mg, 6 mg, Subcutaneous,  Once, 4 of 4 cycles Administration: 6 mg (12/26/2018), 6 mg (01/09/2019), 6 mg (01/23/2019), 6 mg (02/04/2019) CARBOplatin (PARAPLATIN) 570 mg in sodium chloride 0.9 % 250 mL chemo infusion, 570 mg (105.4 % of original dose 544.2 mg), Intravenous,  Once, 1 of 1 cycle Dose  modification:   (original dose 544.2 mg, Cycle 5) Administration: 570 mg (02/17/2019) cyclophosphamide (CYTOXAN) 980 mg in sodium chloride 0.9 % 250 mL chemo infusion, 600 mg/m2 = 980 mg, Intravenous,  Once, 4 of 4 cycles Dose modification: 500 mg/m2 (original dose 600 mg/m2, Cycle 2, Reason: Dose not tolerated) Administration: 980 mg (12/23/2018), 820 mg (01/06/2019), 820 mg (01/20/2019), 820 mg (02/02/2019) PACLitaxel (TAXOL) 132 mg in sodium chloride 0.9 % 250 mL chemo infusion (</= 13m/m2), 80 mg/m2 = 132 mg, Intravenous,  Once, 4 of 4 cycles Dose modification: 65 mg/m2 (original dose 80 mg/m2, Cycle 5, Reason: Provider Judgment), 50 mg/m2 (original dose 80 mg/m2, Cycle 6, Reason: Provider Judgment), 65 mg/m2 (original dose 80 mg/m2, Cycle 6, Reason: Provider Judgment) Administration: 132 mg (02/17/2019), 108 mg (02/24/2019), 84 mg (03/31/2019), 84 mg (04/07/2019), 108 mg (04/14/2019), 108 mg (04/21/2019), 108 mg (04/28/2019), 108 mg (05/05/2019), 108 mg (05/12/2019), 108 mg (05/19/2019), 108 mg (05/26/2019), 84 mg (03/24/2019) fosaprepitant (EMEND) 150 mg, dexamethasone (DECADRON) 12 mg in sodium chloride 0.9 % 145 mL IVPB, , Intravenous,  Once, 7 of 7 cycles Administration:  (12/23/2018),  (02/17/2019),  (01/06/2019),  (01/20/2019),  (02/02/2019),  (03/31/2019),  (04/21/2019)  for chemotherapy treatment.    07/04/2019 Surgery   Left lumpectomy ([facility]: no residual carcinoma and 13 left axillary lymph nodes negative for carcinoma.     CHIEF COMPLIANT: Follow-up s/p lumpectomy to review pathology   INTERVAL HISTORY: Heather Hebert a 59y.o. 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 She underwent a left lumpectomy on 07/04/19 for  which pathology showed no residual carcinoma and 13 left axillary lymph nodes negative for carcinoma. She presents to the clinic today to review the pathology report and discuss further treatment.   ALLERGIES:  has No  Known Allergies.  MEDICATIONS:  Current Outpatient Medications  Medication Sig Dispense Refill  . aspirin-acetaminophen-caffeine (EXCEDRIN MIGRAINE) 250-250-65 MG tablet Take 2 tablets by mouth every 6 (six) hours as needed for headache.    . bismuth subsalicylate (PEPTO BISMOL) 262 MG chewable tablet Chew 262-524 mg by mouth as needed for indigestion.    . Calcium Carb-Cholecalciferol (CALCIUM 600 + D PO) Take 1 tablet by mouth 2 (two) times daily.    . cetirizine (ZYRTEC) 10 MG tablet Take 10 mg by mouth daily.    . clotrimazole-betamethasone (LOTRISONE) cream Apply 1 application topically 2 (two) times daily. 30 g 0  . Hypertonic Nasal Wash (SINUS RINSE KIT NA) Place 1 spray into the nose daily as needed (congestion).    . LORazepam (ATIVAN) 0.5 MG tablet Take 1 tablet (0.5 mg total) by mouth at bedtime as needed for sleep. 30 tablet 0  . Melatonin 5 MG CAPS Take 5-10 mg by mouth at bedtime as needed (sleep).    . Multiple Vitamin (MULTIVITAMIN WITH MINERALS) TABS tablet Take 2 tablets by mouth daily.     . Olopatadine HCl (PATADAY OP) Place 2 drops into both eyes daily as needed (dry eyes).     Marland Kitchen oxyCODONE (OXY IR/ROXICODONE) 5 MG immediate release tablet Take 1 tablet (5 mg total) by mouth every 6 (six) hours as needed for severe pain. 20 tablet 0  . Probiotic Product (PROBIOTIC PO) Take 1 tablet by mouth daily.     . urea (CARMOL) 40 % CREA Apply 1 application topically daily as needed (dry skin / rash).     No current facility-administered medications for this visit.    PHYSICAL EXAMINATION: ECOG PERFORMANCE STATUS: 1 - Symptomatic but completely ambulatory  Vitals:   07/11/19 0936  BP: 116/71  Pulse: 83  Resp: 17  Temp: 98.7 F (37.1 C)  SpO2: 100%   Filed Weights   07/11/19 0936  Weight: 132 lb 14.4 oz (60.3 kg)    LABORATORY DATA:  I have reviewed the data as listed CMP Latest Ref Rng & Units 05/26/2019 05/19/2019 05/12/2019  Glucose 70 - 99 mg/dL 131(H) 93 109(H)  BUN  6 - 20 mg/dL _0 Creatinine 0.44 - 1.00 mg/dL 0.72 0.69 0.67  Sodium 135 - 145 mmol/L 141 133(L) 140  Potassium 3.5 - 5.1 mmol/L 3.7 3.9 3.9  Chloride 98 - 111 mmol/L 104 101 104  CO2 22 - 32 mmol/L _1 Calcium 8.9 - 10.3 mg/dL 9.2 9.3 9.3  Total Protein 6.5 - 8.1 g/dL 6.8 6.7 6.7  Total Bilirubin 0.3 - 1.2 mg/dL <0.2(L) 0.3 0.2(L)  Alkaline Phos 38 - 126 U/L 100 95 100  AST 15 - 41 U/L _2 ALT 0 - 44 U/L _3 Lab Results  Component Value Date   WBC 4.5 06/30/2019   HGB 12.5 06/30/2019   HCT 39.3 06/30/2019   MCV 99.2 06/30/2019   PLT 167 06/30/2019   NEUTROABS 1.8 05/26/2019    ASSESSMENT & PLAN:  Malignant neoplasm of lower-outer quadrant of left breast of female, estrogen receptor negative ([place]) 12/07/2018:Patient palpated a lump in the left breast. Mammogram showed a 1.4cm mass at the 5 o'clock position in the left breast, a  left axillary lymph node measuring 2.8cm, and 2 smaller axillary lymph nodes with cortical thickening. 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 Neoadjuvant chemotherapy with Adriamycin and Cytoxan dose dense 4 followed byTaxolweekly 12with carboplatin every 3 weeks--carbo eliminated due to cytopenias completed 05/26/2019 2. Left lumpectomy [facility]): no residual carcinoma and 13 left axillary lymph nodes negative for carcinoma. 3. Followed by adjuvant radiation therapy  Breast MRI on 12/21/18 showed the known 1.5cm mass in the left breast, at least 6 abnormal left axillary lymph nodes, and no right breast malignancy -------------------------------------------------------------------------------------------------------------------------------------------------- 07/04/2019:Left lumpectomy [facility]): no residual carcinoma and 13 left axillary lymph nodes negative for carcinoma. Pathology counseling: I discussed the final pathology report of the patient  provided  a copy of this report. I discussed the margins as well as lymph node surgeries. We also discussed the final staging along with previously performed ER/PR and HER-2/neu testing.  Treatment plan: Adjuvant radiation therapy followed by surveillance  Return to clinic in 4 months for survivorship care plan visit I can see her after that in 6 months and then once a year thereafter. She is building a house in [place].  She is excited about that.   No orders of the defined types were placed in this encounter.  The patient has a good understanding of the overall plan. she agrees with it. she will call with any problems that may develop before the next visit here.  Total time spent: 30 mins including face to face time and time spent for planning, charting and coordination of care  Nicholas Lose, MD 07/11/2019  I, Cloyde Reams Dorshimer, am acting as scribe for Dr. Nicholas Lose.  I have reviewed the above documentation for accuracy and completeness, and I agree with the above.

## 2019-07-11 ENCOUNTER — Encounter: Payer: Self-pay | Admitting: *Deleted

## 2019-07-11 ENCOUNTER — Other Ambulatory Visit: Payer: Self-pay

## 2019-07-11 ENCOUNTER — Inpatient Hospital Stay: Payer: Commercial Managed Care - PPO | Attending: Hematology and Oncology | Admitting: Hematology and Oncology

## 2019-07-11 DIAGNOSIS — C50512 Malignant neoplasm of lower-outer quadrant of left female breast: Secondary | ICD-10-CM | POA: Diagnosis present

## 2019-07-11 DIAGNOSIS — Z923 Personal history of irradiation: Secondary | ICD-10-CM | POA: Diagnosis not present

## 2019-07-11 DIAGNOSIS — Z9221 Personal history of antineoplastic chemotherapy: Secondary | ICD-10-CM | POA: Diagnosis not present

## 2019-07-11 DIAGNOSIS — Z171 Estrogen receptor negative status [ER-]: Secondary | ICD-10-CM | POA: Insufficient documentation

## 2019-07-11 NOTE — Assessment & Plan Note (Signed)
12/07/2018:Patient palpated a lump in the left breast. Mammogram showed a 1.4cm mass at the 5 o'clock position in the left breast, a left axillary lymph node measuring 2.8cm, and 2 smaller axillary lymph nodes with cortical thickening. 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 Neoadjuvant chemotherapy with Adriamycin and Cytoxan dose dense 4 followed byTaxolweekly 12with carboplatin every 3 weeks--carbo eliminated due to cytopenias completed 05/26/2019 2. Left lumpectomy [facility]): no residual carcinoma and 13 left axillary lymph nodes negative for carcinoma. 3. Followed by adjuvant radiation therapy  Breast MRI on 12/21/18 showed the known 1.5cm mass in the left breast, at least 6 abnormal left axillary lymph nodes, and no right breast malignancy -------------------------------------------------------------------------------------------------------------------------------------------------- 07/04/2019:Left lumpectomy [facility]): no residual carcinoma and 13 left axillary lymph nodes negative for carcinoma. Pathology counseling: I discussed the final pathology report of the patient provided  a copy of this report. I discussed the margins as well as lymph node surgeries. We also discussed the final staging along with previously performed ER/PR and HER-2/neu testing.  Treatment plan: Adjuvant radiation therapy followed by surveillance  Return to clinic at the end of radiation

## 2019-07-20 ENCOUNTER — Telehealth: Payer: Self-pay | Admitting: Adult Health

## 2019-07-20 ENCOUNTER — Encounter: Payer: Self-pay | Admitting: *Deleted

## 2019-07-20 NOTE — Telephone Encounter (Signed)
Scheduled per 06/01 los, patient has been called and notified. 

## 2019-07-25 ENCOUNTER — Ambulatory Visit: Payer: Commercial Managed Care - PPO

## 2019-07-25 ENCOUNTER — Ambulatory Visit: Payer: Commercial Managed Care - PPO | Admitting: Radiation Oncology

## 2019-07-26 ENCOUNTER — Encounter: Payer: Self-pay | Admitting: *Deleted

## 2019-08-01 ENCOUNTER — Other Ambulatory Visit: Payer: Self-pay

## 2019-08-01 ENCOUNTER — Encounter: Payer: Self-pay | Admitting: Radiation Oncology

## 2019-08-01 ENCOUNTER — Ambulatory Visit
Admission: RE | Admit: 2019-08-01 | Discharge: 2019-08-01 | Disposition: A | Discharge status: Home / Self Care | Payer: Commercial Managed Care - PPO | Source: Ambulatory Visit | Attending: Radiation Oncology | Admitting: Radiation Oncology

## 2019-08-01 ENCOUNTER — Encounter: Payer: Self-pay | Admitting: *Deleted

## 2019-08-01 VITALS — BP 142/79 | HR 79 | Temp 97.1°F | Resp 18 | Ht 62.0 in | Wt 132.0 lb

## 2019-08-01 DIAGNOSIS — Z171 Estrogen receptor negative status [ER-]: Secondary | ICD-10-CM | POA: Diagnosis not present

## 2019-08-01 DIAGNOSIS — C50512 Malignant neoplasm of lower-outer quadrant of left female breast: Secondary | ICD-10-CM | POA: Insufficient documentation

## 2019-08-01 DIAGNOSIS — Z510 Encounter for antineoplastic radiation therapy: Secondary | ICD-10-CM | POA: Diagnosis not present

## 2019-08-01 NOTE — Progress Notes (Signed)
Patient here today as a new follow-up for left breast cancer. Patient had a lumpectomy on 07/04/19.  Patient denies having pain. Patient states mild fatigue. Patient states that her skin is healing from the drain that was placed during surgery.  BP (!) 142/79 (BP Location: Right Arm, Patient Position: Sitting, Cuff Size: Normal)   Pulse 79   Temp (!) 97.1 F (36.2 C)   Resp 18   Ht 5\' 2"  (1.575 m)   Wt 132 lb (59.9 kg)   SpO2 100%   BMI 24.14 kg/m

## 2019-08-01 NOTE — Progress Notes (Signed)
Radiation Oncology         [phone]) [phone] ________________________________  Name: Heather Hebert        MRN: [id number]  Date of Service: 08/01/2019 DOB: 26-Jul-1960  CC:[name], FNP  Nicholas Lose, MD     REFERRING PHYSICIAN: Nicholas Lose, MD   DIAGNOSIS: The encounter diagnosis was Malignant neoplasm of lower-outer quadrant of left breast of female, estrogen receptor negative ([place]).   HISTORY OF PRESENT ILLNESS: Heather Hebert is a 59 y.o. female originally seen in the multidisciplinary breast clinic for a new diagnosis of left breast cancer. The patient was noted to have a palpable mass in the left breast.  Diagnostic imaging revealed a mass at 5:00 measuring 1.4 x 1.2 x 0.9 cm.  She had an associated abnormal appearing lymph node and 2 additional lymph nodes with thickened cortices.  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  Her tumor was triple negative with a Ki-67 of 80%. Between 12/23/2018 and 05/26/2019 she completed a course of neoadjuvant chemotherapy.  Her posttreatment MRI scan revealed complete resolution of the left breast mass and 6 abnormal appearing lymph nodes.  She subsequently underwent surgical resection on 07/04/2019 with left lumpectomy, axillary lymph node dissection and Port-A-Cath removal.  Final pathology revealed no residual invasive carcinoma with effects of post neoadjuvant therapy, 13 lymph nodes were also removed, and were all clear of disease.  She did have a right arm skin biopsy due to a dermatitis that she developed during chemotherapy and this was consistent with interface dermatitis.  She is seen today to discuss the rationale for adjuvant radiotherapy.     PREVIOUS RADIATION THERAPY: No   PAST MEDICAL HISTORY:  Past Medical History:  Diagnosis Date  . Breast cancer ([place]) 2020   Left  . Cancer  ([place]) 11/2018   left breast ICD  . Family history of breast cancer        PAST SURGICAL HISTORY: Past Surgical History:  Procedure Laterality Date  . APPENDECTOMY    . BREAST LUMPECTOMY WITH RADIOACTIVE SEED AND AXILLARY LYMPH NODE DISSECTION Left 07/04/2019   Procedure: LEFT BREAST LUMPECTOMY WITH RADIOACTIVE SEED AND AXILLARY LYMPH NODE DISSECTION;  Surgeon: [name], MD;  Location: [facility];  Service: General;  Laterality: Left;  PEC BLOCK  . CESAREAN SECTION    . LAPAROSCOPIC APPENDECTOMY N/A 08/05/2015   Procedure: APPENDECTOMY LAPAROSCOPIC;  Surgeon: Mickeal Skinner, MD;  Location: [facility];  Service: General;  Laterality: N/A;  . PORT-A-CATH REMOVAL Left 07/04/2019   Procedure: REMOVAL PORT-A-CATH;  Surgeon: [name], MD;  Location: [facility];  Service: General;  Laterality: Left;  . PORTACATH PLACEMENT Left 12/22/2018   Procedure: INSERTION PORT-A-CATH;  Surgeon: [name], MD;  Location: [facility];  Service: General;  Laterality: Left;  . PUNCH BIOPSY OF SKIN Right 07/04/2019   Procedure: Punch Biopsy Of Skin;  Surgeon: [name], MD;  Location: [facility];  Service: General;  Laterality: Right;     FAMILY HISTORY:  Family History  Problem Relation Age of Onset  . Breast cancer Cousin        first cousin once removed (father's cousin)     SOCIAL HISTORY:  reports that she has quit smoking. She has never used smokeless tobacco. She reports current alcohol use. She reports that she does not use drugs. The patient  is married and lives in [place]. She works for [employer] as a [employer]. She plans to move to [facility] soon.   ALLERGIES: Patient has no known allergies.   MEDICATIONS:  Current Outpatient Medications  Medication Sig Dispense Refill  . bismuth subsalicylate (PEPTO BISMOL) 262 MG chewable tablet Chew 262-524 mg by mouth as needed for indigestion.    . Calcium Carb-Cholecalciferol (CALCIUM 600 + D PO) Take 1 tablet  by mouth 2 (two) times daily.    . cetirizine (ZYRTEC) 10 MG tablet Take 10 mg by mouth daily.    . clotrimazole-betamethasone (LOTRISONE) cream Apply 1 application topically 2 (two) times daily. 30 g 0  . Hypertonic Nasal Wash (SINUS RINSE KIT NA) Place 1 spray into the nose daily as needed (congestion).    . Melatonin 5 MG CAPS Take 5-10 mg by mouth at bedtime as needed (sleep).    . Multiple Vitamin (MULTIVITAMIN WITH MINERALS) TABS tablet Take 2 tablets by mouth daily.     . Olopatadine HCl (PATADAY OP) Place 2 drops into both eyes daily as needed (dry eyes).     . Probiotic Product (PROBIOTIC PO) Take 1 tablet by mouth daily.     . urea (CARMOL) 40 % CREA Apply 1 application topically daily as needed (dry skin / rash).    Marland Kitchen aspirin-acetaminophen-caffeine (EXCEDRIN MIGRAINE) 250-250-65 MG tablet Take 2 tablets by mouth every 6 (six) hours as needed for headache. (Patient not taking: Reported on 08/01/2019)    . LORazepam (ATIVAN) 0.5 MG tablet Take 1 tablet (0.5 mg total) by mouth at bedtime as needed for sleep. (Patient not taking: Reported on 08/01/2019) 30 tablet 0  . oxyCODONE (OXY IR/ROXICODONE) 5 MG immediate release tablet Take 1 tablet (5 mg total) by mouth every 6 (six) hours as needed for severe pain. (Patient not taking: Reported on 08/01/2019) 20 tablet 0   No current facility-administered medications for this encounter.     REVIEW OF SYSTEMS: On review of systems, the patient reports that she is doing well overall.  She states that she feels that she is healing well.  She and her husband are still in the process of finishing their house at [facility] but are set to close in the next week.  She denies any chest pain, shortness of breath, cough, fevers, chills, night sweats, unintended weight changes. She denies any bowel or bladder disturbances, and denies abdominal pain, nausea or vomiting. She denies any new musculoskeletal or joint aches or pains. A complete review of systems is  obtained and is otherwise negative.     PHYSICAL EXAM:  Wt Readings from Last 3 Encounters:  08/01/19 132 lb (59.9 kg)  07/11/19 132 lb 14.4 oz (60.3 kg)  07/04/19 134 lb 0.6 oz (60.8 kg)   Temp Readings from Last 3 Encounters:  08/01/19 (!) 97.1 F (36.2 C)  07/11/19 98.7 F (37.1 C) (Temporal)  07/04/19 97.8 F (36.6 C)   BP Readings from Last 3 Encounters:  08/01/19 (!) 142/79  07/11/19 116/71  07/04/19 (!) 149/75   Pulse Readings from Last 3 Encounters:  08/01/19 79  07/11/19 83  07/04/19 79    In general this is a well appearing caucasian female in no acute distress. She's alert and oriented x4 and appropriate throughout the examination. Cardiopulmonary assessment is negative for acute distress and she exhibits normal effort.  Her left breast incision sites are intact, no evidence of cellulitic streaking bleeding or drainage is identified.  No  evidence of chest wall or upper extremity edema is noted.  ECOG = 0  0 - Asymptomatic (Fully active, able to carry on all predisease activities without restriction)  1 - Symptomatic but completely ambulatory (Restricted in physically strenuous activity but ambulatory and able to carry out work of a light or sedentary nature. For example, light housework, office work)  2 - Symptomatic, <50% in bed during the day (Ambulatory and capable of all self care but unable to carry out any work activities. Up and about more than 50% of waking hours)  3 - Symptomatic, >50% in bed, but not bedbound (Capable of only limited self-care, confined to bed or chair 50% or more of waking hours)  4 - Bedbound (Completely disabled. Cannot carry on any self-care. Totally confined to bed or chair)  5 - Death   Eustace Pen MM, Creech RH, Tormey DC, et al. [phone]). "Toxicity and response criteria of the [facility]". [place] Oncol. 5 (6): 649-55    LABORATORY DATA:  Lab Results  Component Value Date   WBC 4.5 06/30/2019   HGB  12.5 06/30/2019   HCT 39.3 06/30/2019   MCV 99.2 06/30/2019   PLT 167 06/30/2019   Lab Results  Component Value Date   NA 141 05/26/2019   K 3.7 05/26/2019   CL 104 05/26/2019   CO2 23 05/26/2019   Lab Results  Component Value Date   ALT 23 05/26/2019   AST 27 05/26/2019   ALKPHOS 100 05/26/2019   BILITOT <0.2 (L) 05/26/2019      RADIOGRAPHY: MM Breast Surgical Specimen  Result Date: 07/04/2019 CLINICAL DATA:  Specimen radiograph status post left breast lumpectomy. EXAM: SPECIMEN RADIOGRAPH OF THE LEFT BREAST COMPARISON:  Previous exam(s). FINDINGS: Status post excision of the left breast. The radioactive seed and biopsy marker clip are present, completely intact, and were marked for pathology. These findings were communicated with the OR at 8:49 a.m. IMPRESSION: Specimen radiograph of the left breast. Electronically Signed   By: Ammie Ferrier M.D.   On: 07/04/2019 08:50   MM LT RADIOACTIVE SEED LOC MAMMO GUIDE  Result Date: 07/03/2019 CLINICAL DATA:  59 year old female for radioactive seed localization of LEFT breast cancer prior to lumpectomy. EXAM: MAMMOGRAPHIC GUIDED RADIOACTIVE SEED LOCALIZATION OF THE LEFT BREAST COMPARISON:  Previous exam(s). FINDINGS: Patient presents for radioactive seed localization prior to LEFT lumpectomy. I met with the patient and we discussed the procedure of seed localization including benefits and alternatives. We discussed the high likelihood of a successful procedure. We discussed the risks of the procedure including infection, bleeding, tissue injury and further surgery. We discussed the low dose of radioactivity involved in the procedure. Informed, written consent was given. The usual time-out protocol was performed immediately prior to the procedure. Using mammographic guidance, sterile technique, 1% lidocaine and an I-125 radioactive seed, the RIBBON clip was localized using a LATERAL approach. The follow-up mammogram images confirm the seed in  the expected location and were marked for Dr. Barry Dienes. Follow-up survey of the patient confirms presence of the radioactive seed. Order number of I-125 seed:  [id number]. Total activity:  8.466 millicuries.  Reference Date: 06/23/2019. The patient tolerated the procedure well and was released from the [facility]. She was given instructions regarding seed removal. IMPRESSION: Radioactive seed localization LEFT breast. No apparent complications. Electronically Signed   By: Margarette Canada M.D.   On: 07/03/2019 11:54       IMPRESSION/PLAN: 1. Stage IIB, cT1cN1M0 grade 3, triple  negative invasive ductal carcinoma of the left breast. Dr. Lisbeth Renshaw discusses the final results of her pathology findings and reviews the nature of left breast disease.  We discussed the rationale for proceeding with adjuvant radiotherapy to the breast and regional lymph nodes taking into consideration her nodal disease at presentation.  She is well-healed and is ready to proceed.  We discussed the risks, benefits, short, and long term effects of radiotherapy, and the patient is interested in proceeding at the appropriate interval. Dr. Lisbeth Renshaw discusses the delivery and logistics of radiotherapy and recommends a course of 6 1/2 weeks of radiotherapy to the left breast and regional nodes with deep inspiration breath hold technique. Written consent is obtained and placed in the chart, a copy was provided to the patient.  She will simulate this morning and begin in the next week or so.  In a visit lasting 45 minutes, greater than 50% of the time was spent face to face discussing her case, and coordinating the patient's care.  The above documentation reflects my direct findings during this shared patient visit. Please see the separate note by Dr. Lisbeth Renshaw on this date for the remainder of the patient's plan of care.    Carola Rhine, PAC

## 2019-08-05 DIAGNOSIS — Z510 Encounter for antineoplastic radiation therapy: Secondary | ICD-10-CM | POA: Diagnosis not present

## 2019-08-08 ENCOUNTER — Other Ambulatory Visit: Payer: Self-pay

## 2019-08-08 ENCOUNTER — Ambulatory Visit
Admission: RE | Admit: 2019-08-08 | Payer: Commercial Managed Care - PPO | Source: Ambulatory Visit | Admitting: Radiation Oncology

## 2019-08-08 DIAGNOSIS — Z510 Encounter for antineoplastic radiation therapy: Secondary | ICD-10-CM | POA: Diagnosis not present

## 2019-08-09 ENCOUNTER — Ambulatory Visit
Admission: RE | Admit: 2019-08-09 | Discharge: 2019-08-09 | Disposition: A | Discharge status: Home / Self Care | Payer: Commercial Managed Care - PPO | Source: Ambulatory Visit | Attending: Radiation Oncology | Admitting: Radiation Oncology

## 2019-08-09 ENCOUNTER — Other Ambulatory Visit: Payer: Self-pay

## 2019-08-09 DIAGNOSIS — Z510 Encounter for antineoplastic radiation therapy: Secondary | ICD-10-CM | POA: Diagnosis not present

## 2019-08-10 ENCOUNTER — Other Ambulatory Visit: Payer: Self-pay

## 2019-08-10 ENCOUNTER — Ambulatory Visit
Admission: RE | Admit: 2019-08-10 | Discharge: 2019-08-10 | Disposition: A | Discharge status: Home / Self Care | Payer: Commercial Managed Care - PPO | Source: Ambulatory Visit | Attending: Radiation Oncology | Admitting: Radiation Oncology

## 2019-08-10 DIAGNOSIS — Z171 Estrogen receptor negative status [ER-]: Secondary | ICD-10-CM | POA: Insufficient documentation

## 2019-08-10 DIAGNOSIS — Z510 Encounter for antineoplastic radiation therapy: Secondary | ICD-10-CM | POA: Diagnosis present

## 2019-08-10 DIAGNOSIS — C50512 Malignant neoplasm of lower-outer quadrant of left female breast: Secondary | ICD-10-CM | POA: Diagnosis present

## 2019-08-10 NOTE — Progress Notes (Signed)
Pt here for patient teaching.  Pt given Radiation and You booklet, skin care instructions, Alra deodorant and Sonafine.  Reviewed areas of pertinence such as fatigue, hair loss, skin changes, breast tenderness and breast swelling . Pt able to give teach back of to pat skin and use unscented/gentle soap,apply Sonafine bid, avoid applying anything to skin within 4 hours of treatment, avoid wearing an under wire bra and to use an electric razor if they must shave. Pt verbalizes understanding of information given and will contact nursing with any questions or concerns.     LaToya M. Silva RN, BSN             

## 2019-08-11 ENCOUNTER — Ambulatory Visit
Admission: RE | Admit: 2019-08-11 | Discharge: 2019-08-11 | Disposition: A | Discharge status: Home / Self Care | Payer: Commercial Managed Care - PPO | Source: Ambulatory Visit | Attending: Radiation Oncology | Admitting: Radiation Oncology

## 2019-08-11 DIAGNOSIS — C50512 Malignant neoplasm of lower-outer quadrant of left female breast: Secondary | ICD-10-CM

## 2019-08-11 DIAGNOSIS — Z171 Estrogen receptor negative status [ER-]: Secondary | ICD-10-CM

## 2019-08-11 MED ORDER — ALRA NON-METALLIC DEODORANT (RAD-ONC)
1.0000 "application " | Freq: Once | TOPICAL | Status: AC
  Administered 2019-08-11: 1 via TOPICAL

## 2019-08-11 MED ORDER — SONAFINE EX EMUL
1.0000 "application " | Freq: Two times a day (BID) | CUTANEOUS | Status: DC
  Administered 2019-08-11: 1 via TOPICAL

## 2019-08-15 ENCOUNTER — Other Ambulatory Visit: Payer: Self-pay

## 2019-08-15 ENCOUNTER — Ambulatory Visit
Admission: RE | Admit: 2019-08-15 | Discharge: 2019-08-15 | Disposition: A | Discharge status: Home / Self Care | Payer: Commercial Managed Care - PPO | Source: Ambulatory Visit | Attending: Radiation Oncology | Admitting: Radiation Oncology

## 2019-08-15 DIAGNOSIS — C50512 Malignant neoplasm of lower-outer quadrant of left female breast: Secondary | ICD-10-CM | POA: Diagnosis not present

## 2019-08-16 ENCOUNTER — Ambulatory Visit
Admission: RE | Admit: 2019-08-16 | Discharge: 2019-08-16 | Disposition: A | Discharge status: Home / Self Care | Payer: Commercial Managed Care - PPO | Source: Ambulatory Visit | Attending: Radiation Oncology | Admitting: Radiation Oncology

## 2019-08-16 ENCOUNTER — Other Ambulatory Visit: Payer: Self-pay

## 2019-08-16 DIAGNOSIS — C50512 Malignant neoplasm of lower-outer quadrant of left female breast: Secondary | ICD-10-CM | POA: Diagnosis not present

## 2019-08-17 ENCOUNTER — Ambulatory Visit
Admission: RE | Admit: 2019-08-17 | Discharge: 2019-08-17 | Disposition: A | Discharge status: Home / Self Care | Payer: Commercial Managed Care - PPO | Source: Ambulatory Visit | Attending: Radiation Oncology | Admitting: Radiation Oncology

## 2019-08-17 DIAGNOSIS — C50512 Malignant neoplasm of lower-outer quadrant of left female breast: Secondary | ICD-10-CM | POA: Diagnosis not present

## 2019-08-18 ENCOUNTER — Other Ambulatory Visit: Payer: Self-pay

## 2019-08-18 ENCOUNTER — Ambulatory Visit
Admission: RE | Admit: 2019-08-18 | Discharge: 2019-08-18 | Disposition: A | Discharge status: Home / Self Care | Payer: Commercial Managed Care - PPO | Source: Ambulatory Visit | Attending: Radiation Oncology | Admitting: Radiation Oncology

## 2019-08-18 DIAGNOSIS — C50512 Malignant neoplasm of lower-outer quadrant of left female breast: Secondary | ICD-10-CM | POA: Diagnosis not present

## 2019-08-19 ENCOUNTER — Ambulatory Visit: Payer: Commercial Managed Care - PPO

## 2019-08-20 ENCOUNTER — Ambulatory Visit: Payer: Commercial Managed Care - PPO

## 2019-08-21 ENCOUNTER — Ambulatory Visit
Admission: RE | Admit: 2019-08-21 | Discharge: 2019-08-21 | Disposition: A | Discharge status: Home / Self Care | Payer: Commercial Managed Care - PPO | Source: Ambulatory Visit | Attending: Radiation Oncology | Admitting: Radiation Oncology

## 2019-08-21 DIAGNOSIS — C50512 Malignant neoplasm of lower-outer quadrant of left female breast: Secondary | ICD-10-CM | POA: Diagnosis not present

## 2019-08-22 ENCOUNTER — Other Ambulatory Visit: Payer: Self-pay

## 2019-08-22 ENCOUNTER — Ambulatory Visit
Admission: RE | Admit: 2019-08-22 | Discharge: 2019-08-22 | Disposition: A | Discharge status: Home / Self Care | Payer: Commercial Managed Care - PPO | Source: Ambulatory Visit | Attending: Radiation Oncology | Admitting: Radiation Oncology

## 2019-08-22 DIAGNOSIS — C50512 Malignant neoplasm of lower-outer quadrant of left female breast: Secondary | ICD-10-CM | POA: Diagnosis not present

## 2019-08-23 ENCOUNTER — Other Ambulatory Visit: Payer: Self-pay

## 2019-08-23 ENCOUNTER — Ambulatory Visit
Admission: RE | Admit: 2019-08-23 | Discharge: 2019-08-23 | Disposition: A | Discharge status: Home / Self Care | Payer: Commercial Managed Care - PPO | Source: Ambulatory Visit | Attending: Radiation Oncology | Admitting: Radiation Oncology

## 2019-08-23 DIAGNOSIS — C50512 Malignant neoplasm of lower-outer quadrant of left female breast: Secondary | ICD-10-CM | POA: Diagnosis not present

## 2019-08-24 ENCOUNTER — Other Ambulatory Visit: Payer: Self-pay

## 2019-08-24 ENCOUNTER — Ambulatory Visit
Admission: RE | Admit: 2019-08-24 | Discharge: 2019-08-24 | Disposition: A | Discharge status: Home / Self Care | Payer: Commercial Managed Care - PPO | Source: Ambulatory Visit | Attending: Radiation Oncology | Admitting: Radiation Oncology

## 2019-08-24 DIAGNOSIS — C50512 Malignant neoplasm of lower-outer quadrant of left female breast: Secondary | ICD-10-CM | POA: Diagnosis not present

## 2019-08-25 ENCOUNTER — Other Ambulatory Visit: Payer: Self-pay | Admitting: Radiation Oncology

## 2019-08-25 ENCOUNTER — Other Ambulatory Visit: Payer: Self-pay

## 2019-08-25 ENCOUNTER — Ambulatory Visit
Admission: RE | Admit: 2019-08-25 | Discharge: 2019-08-25 | Disposition: A | Discharge status: Home / Self Care | Payer: Commercial Managed Care - PPO | Source: Ambulatory Visit | Attending: Radiation Oncology | Admitting: Radiation Oncology

## 2019-08-25 DIAGNOSIS — C50512 Malignant neoplasm of lower-outer quadrant of left female breast: Secondary | ICD-10-CM | POA: Diagnosis not present

## 2019-08-25 MED ORDER — FLUCONAZOLE 100 MG PO TABS
200.0000 mg | ORAL_TABLET | Freq: Every day | ORAL | 5 refills | Status: DC
Start: 2019-08-25 — End: 2021-09-11

## 2019-08-28 ENCOUNTER — Ambulatory Visit
Admission: RE | Admit: 2019-08-28 | Discharge: 2019-08-28 | Disposition: A | Discharge status: Home / Self Care | Payer: Commercial Managed Care - PPO | Source: Ambulatory Visit | Attending: Radiation Oncology | Admitting: Radiation Oncology

## 2019-08-28 ENCOUNTER — Other Ambulatory Visit: Payer: Self-pay

## 2019-08-28 DIAGNOSIS — C50512 Malignant neoplasm of lower-outer quadrant of left female breast: Secondary | ICD-10-CM | POA: Diagnosis not present

## 2019-08-29 ENCOUNTER — Ambulatory Visit
Admission: RE | Admit: 2019-08-29 | Discharge: 2019-08-29 | Disposition: A | Discharge status: Home / Self Care | Payer: Commercial Managed Care - PPO | Source: Ambulatory Visit | Attending: Radiation Oncology | Admitting: Radiation Oncology

## 2019-08-29 ENCOUNTER — Other Ambulatory Visit: Payer: Self-pay

## 2019-08-29 DIAGNOSIS — C50512 Malignant neoplasm of lower-outer quadrant of left female breast: Secondary | ICD-10-CM | POA: Diagnosis not present

## 2019-08-30 ENCOUNTER — Other Ambulatory Visit: Payer: Self-pay

## 2019-08-30 ENCOUNTER — Ambulatory Visit
Admission: RE | Admit: 2019-08-30 | Discharge: 2019-08-30 | Disposition: A | Discharge status: Home / Self Care | Payer: Commercial Managed Care - PPO | Source: Ambulatory Visit | Attending: Radiation Oncology | Admitting: Radiation Oncology

## 2019-08-30 DIAGNOSIS — C50512 Malignant neoplasm of lower-outer quadrant of left female breast: Secondary | ICD-10-CM | POA: Diagnosis not present

## 2019-08-31 ENCOUNTER — Ambulatory Visit
Admission: RE | Admit: 2019-08-31 | Discharge: 2019-08-31 | Disposition: A | Discharge status: Home / Self Care | Payer: Commercial Managed Care - PPO | Source: Ambulatory Visit | Attending: Radiation Oncology | Admitting: Radiation Oncology

## 2019-08-31 ENCOUNTER — Other Ambulatory Visit: Payer: Self-pay

## 2019-08-31 DIAGNOSIS — C50512 Malignant neoplasm of lower-outer quadrant of left female breast: Secondary | ICD-10-CM | POA: Diagnosis not present

## 2019-09-01 ENCOUNTER — Ambulatory Visit: Payer: Commercial Managed Care - PPO | Admitting: Physician Assistant

## 2019-09-01 ENCOUNTER — Ambulatory Visit
Admission: RE | Admit: 2019-09-01 | Discharge: 2019-09-01 | Disposition: A | Discharge status: Home / Self Care | Payer: Commercial Managed Care - PPO | Source: Ambulatory Visit | Attending: Radiation Oncology | Admitting: Radiation Oncology

## 2019-09-01 ENCOUNTER — Encounter: Payer: Self-pay | Admitting: Physician Assistant

## 2019-09-01 ENCOUNTER — Other Ambulatory Visit: Payer: Self-pay

## 2019-09-01 DIAGNOSIS — Z1283 Encounter for screening for malignant neoplasm of skin: Secondary | ICD-10-CM

## 2019-09-01 DIAGNOSIS — L589 Radiodermatitis, unspecified: Secondary | ICD-10-CM | POA: Diagnosis not present

## 2019-09-01 DIAGNOSIS — C50512 Malignant neoplasm of lower-outer quadrant of left female breast: Secondary | ICD-10-CM | POA: Diagnosis not present

## 2019-09-01 DIAGNOSIS — R21 Rash and other nonspecific skin eruption: Secondary | ICD-10-CM

## 2019-09-01 MED ORDER — DESOXIMETASONE 0.25 % EX CREA: TOPICAL_CREAM | CUTANEOUS | 1 refills | Status: DC

## 2019-09-01 NOTE — Patient Instructions (Addendum)
Avoid sun, wear long sleeves.  Apply cream.  If things do not clear up in a few months call us back for a follow up.

## 2019-09-01 NOTE — Progress Notes (Addendum)
   New Patient   Subjective  Heather Hebert is a 59 y.o. female who presents for the following: Rash (has tried diflucan and this has helped).   Started chemo then rash appeared Location: back and chest Duration: started in Jan./ continued to get worse and spread over time to her ear rims, forehead chest and forehead Quality: red and blotchy with crusts Associated Signs/Symptoms: not much itch Modifying Factors: PO steroids x2 improved it but didn't go away/ diflucan helped/ lotrisone did not help. Severity: increase over time, ears and face have improved Timing 2 weeks after chemo- moved to arms:-- last chemo April 16th..  Context: June 30th started radiation? Not the same skin presentation as her arms etc.   The following portions of the chart were reviewed this encounter and updated as appropriate:     Objective  Well appearing patient in no apparent distress; mood and affect are within normal limits.  A full examination was performed including scalp, head, eyes, ears, nose, lips, neck, chest, axillae, abdomen, back, buttocks, bilateral upper extremities, bilateral lower extremities, hands, feet, fingers, toes, fingernails, and toenails. All findings within normal limits unless otherwise noted below.  Objective  Chest - Medial (Center) (6), Head - Anterior (Face), Left Forearm - Posterior, Right Forearm - Posterior: Thin crusts on an erythematous base. Sun exposed areas. Chest, arms, forehead and ear rims. 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 No LAD or mucosal involvement noted.  Objective  Left Flank, waist up/ legs: No atypical nevi No signs of non-mole skin cancer.   Objective  Left Breast: Diffuse erythema with slight scale. Inferior crease of breast smooth and red.  Assessment & Plan  Rash and other nonspecific skin eruption (9)- lupus like Head - Anterior (Face); Left Forearm - Posterior; Right Forearm - Posterior;  Chest - Medial (Center) (6)  Strict sun avoidance. Recheck in 8 weeks. Biopsy with immunofluorescence  if persistent.   desoximetasone (TOPICORT) 0.25 % cream - Chest - Medial (Center), Head - Anterior (Face), Left Forearm - Posterior, Right Forearm - Posterior  Screening exam for skin cancer (2) waist up/ legs; Left Flank  observe  Radiation dermatitis Left Breast  observe I, Brevyn Ring, PA-C, have reviewed all documentation for this visit. The documentation on 09/26/19 for the exam, diagnosis, procedures, and orders are all accurate and complete.

## 2019-09-04 ENCOUNTER — Ambulatory Visit
Admission: RE | Admit: 2019-09-04 | Discharge: 2019-09-04 | Disposition: A | Discharge status: Home / Self Care | Payer: Commercial Managed Care - PPO | Source: Ambulatory Visit | Attending: Radiation Oncology | Admitting: Radiation Oncology

## 2019-09-04 ENCOUNTER — Other Ambulatory Visit: Payer: Self-pay

## 2019-09-04 DIAGNOSIS — C50512 Malignant neoplasm of lower-outer quadrant of left female breast: Secondary | ICD-10-CM | POA: Diagnosis not present

## 2019-09-05 ENCOUNTER — Other Ambulatory Visit: Payer: Self-pay

## 2019-09-05 ENCOUNTER — Ambulatory Visit
Admission: RE | Admit: 2019-09-05 | Discharge: 2019-09-05 | Disposition: A | Discharge status: Home / Self Care | Payer: Commercial Managed Care - PPO | Source: Ambulatory Visit | Attending: Radiation Oncology | Admitting: Radiation Oncology

## 2019-09-05 DIAGNOSIS — C50512 Malignant neoplasm of lower-outer quadrant of left female breast: Secondary | ICD-10-CM | POA: Diagnosis not present

## 2019-09-06 ENCOUNTER — Other Ambulatory Visit: Payer: Self-pay

## 2019-09-06 ENCOUNTER — Ambulatory Visit
Admission: RE | Admit: 2019-09-06 | Discharge: 2019-09-06 | Disposition: A | Discharge status: Home / Self Care | Payer: Commercial Managed Care - PPO | Source: Ambulatory Visit | Attending: Radiation Oncology | Admitting: Radiation Oncology

## 2019-09-06 DIAGNOSIS — C50512 Malignant neoplasm of lower-outer quadrant of left female breast: Secondary | ICD-10-CM | POA: Diagnosis not present

## 2019-09-07 ENCOUNTER — Other Ambulatory Visit: Payer: Self-pay

## 2019-09-07 ENCOUNTER — Ambulatory Visit
Admission: RE | Admit: 2019-09-07 | Discharge: 2019-09-07 | Disposition: A | Discharge status: Home / Self Care | Payer: Commercial Managed Care - PPO | Source: Ambulatory Visit | Attending: Radiation Oncology | Admitting: Radiation Oncology

## 2019-09-07 DIAGNOSIS — C50512 Malignant neoplasm of lower-outer quadrant of left female breast: Secondary | ICD-10-CM | POA: Diagnosis not present

## 2019-09-08 ENCOUNTER — Ambulatory Visit
Admission: RE | Admit: 2019-09-08 | Discharge: 2019-09-08 | Disposition: A | Discharge status: Home / Self Care | Payer: Commercial Managed Care - PPO | Source: Ambulatory Visit | Attending: Radiation Oncology | Admitting: Radiation Oncology

## 2019-09-08 ENCOUNTER — Other Ambulatory Visit: Payer: Self-pay

## 2019-09-08 DIAGNOSIS — C50512 Malignant neoplasm of lower-outer quadrant of left female breast: Secondary | ICD-10-CM | POA: Diagnosis not present

## 2019-09-11 ENCOUNTER — Ambulatory Visit
Admission: RE | Admit: 2019-09-11 | Discharge: 2019-09-11 | Disposition: A | Discharge status: Home / Self Care | Payer: Commercial Managed Care - PPO | Source: Ambulatory Visit | Attending: Radiation Oncology | Admitting: Radiation Oncology

## 2019-09-11 ENCOUNTER — Other Ambulatory Visit: Payer: Self-pay

## 2019-09-11 DIAGNOSIS — Z510 Encounter for antineoplastic radiation therapy: Secondary | ICD-10-CM | POA: Insufficient documentation

## 2019-09-11 DIAGNOSIS — Z171 Estrogen receptor negative status [ER-]: Secondary | ICD-10-CM | POA: Diagnosis present

## 2019-09-11 DIAGNOSIS — C50512 Malignant neoplasm of lower-outer quadrant of left female breast: Secondary | ICD-10-CM | POA: Insufficient documentation

## 2019-09-12 ENCOUNTER — Other Ambulatory Visit: Payer: Self-pay

## 2019-09-12 ENCOUNTER — Ambulatory Visit
Admission: RE | Admit: 2019-09-12 | Discharge: 2019-09-12 | Disposition: A | Discharge status: Home / Self Care | Payer: Commercial Managed Care - PPO | Source: Ambulatory Visit | Attending: Radiation Oncology | Admitting: Radiation Oncology

## 2019-09-12 DIAGNOSIS — C50512 Malignant neoplasm of lower-outer quadrant of left female breast: Secondary | ICD-10-CM | POA: Diagnosis not present

## 2019-09-13 ENCOUNTER — Other Ambulatory Visit: Payer: Self-pay

## 2019-09-13 ENCOUNTER — Ambulatory Visit
Admission: RE | Admit: 2019-09-13 | Discharge: 2019-09-13 | Disposition: A | Discharge status: Home / Self Care | Payer: Commercial Managed Care - PPO | Source: Ambulatory Visit | Attending: Radiation Oncology | Admitting: Radiation Oncology

## 2019-09-13 DIAGNOSIS — C50512 Malignant neoplasm of lower-outer quadrant of left female breast: Secondary | ICD-10-CM | POA: Diagnosis not present

## 2019-09-14 ENCOUNTER — Other Ambulatory Visit: Payer: Self-pay

## 2019-09-14 ENCOUNTER — Ambulatory Visit
Admission: RE | Admit: 2019-09-14 | Discharge: 2019-09-14 | Disposition: A | Discharge status: Home / Self Care | Payer: Commercial Managed Care - PPO | Source: Ambulatory Visit | Attending: Radiation Oncology | Admitting: Radiation Oncology

## 2019-09-14 DIAGNOSIS — C50512 Malignant neoplasm of lower-outer quadrant of left female breast: Secondary | ICD-10-CM | POA: Diagnosis not present

## 2019-09-15 ENCOUNTER — Ambulatory Visit: Payer: Commercial Managed Care - PPO | Admitting: Radiation Oncology

## 2019-09-15 ENCOUNTER — Ambulatory Visit
Admission: RE | Admit: 2019-09-15 | Discharge: 2019-09-15 | Disposition: A | Discharge status: Home / Self Care | Payer: Commercial Managed Care - PPO | Source: Ambulatory Visit | Attending: Radiation Oncology | Admitting: Radiation Oncology

## 2019-09-15 ENCOUNTER — Other Ambulatory Visit: Payer: Self-pay

## 2019-09-15 DIAGNOSIS — C50512 Malignant neoplasm of lower-outer quadrant of left female breast: Secondary | ICD-10-CM | POA: Diagnosis not present

## 2019-09-18 ENCOUNTER — Ambulatory Visit: Payer: Commercial Managed Care - PPO

## 2019-09-19 ENCOUNTER — Other Ambulatory Visit: Payer: Self-pay

## 2019-09-19 ENCOUNTER — Ambulatory Visit: Payer: Commercial Managed Care - PPO

## 2019-09-19 ENCOUNTER — Ambulatory Visit
Admission: RE | Admit: 2019-09-19 | Discharge: 2019-09-19 | Disposition: A | Discharge status: Home / Self Care | Payer: Commercial Managed Care - PPO | Source: Ambulatory Visit | Attending: Radiation Oncology | Admitting: Radiation Oncology

## 2019-09-19 DIAGNOSIS — C50512 Malignant neoplasm of lower-outer quadrant of left female breast: Secondary | ICD-10-CM | POA: Diagnosis not present

## 2019-09-20 ENCOUNTER — Ambulatory Visit: Payer: Commercial Managed Care - PPO

## 2019-09-20 ENCOUNTER — Ambulatory Visit
Admission: RE | Admit: 2019-09-20 | Discharge: 2019-09-20 | Disposition: A | Discharge status: Home / Self Care | Payer: Commercial Managed Care - PPO | Source: Ambulatory Visit | Attending: Radiation Oncology | Admitting: Radiation Oncology

## 2019-09-20 ENCOUNTER — Other Ambulatory Visit: Payer: Self-pay

## 2019-09-20 DIAGNOSIS — C50512 Malignant neoplasm of lower-outer quadrant of left female breast: Secondary | ICD-10-CM | POA: Diagnosis not present

## 2019-09-21 ENCOUNTER — Other Ambulatory Visit: Payer: Self-pay

## 2019-09-21 ENCOUNTER — Ambulatory Visit
Admission: RE | Admit: 2019-09-21 | Discharge: 2019-09-21 | Disposition: A | Discharge status: Home / Self Care | Payer: Commercial Managed Care - PPO | Source: Ambulatory Visit | Attending: Radiation Oncology | Admitting: Radiation Oncology

## 2019-09-21 DIAGNOSIS — C50512 Malignant neoplasm of lower-outer quadrant of left female breast: Secondary | ICD-10-CM | POA: Diagnosis not present

## 2019-09-22 ENCOUNTER — Ambulatory Visit
Admission: RE | Admit: 2019-09-22 | Discharge: 2019-09-22 | Disposition: A | Discharge status: Home / Self Care | Payer: Commercial Managed Care - PPO | Source: Ambulatory Visit | Attending: Radiation Oncology | Admitting: Radiation Oncology

## 2019-09-22 ENCOUNTER — Other Ambulatory Visit: Payer: Self-pay

## 2019-09-22 DIAGNOSIS — C50512 Malignant neoplasm of lower-outer quadrant of left female breast: Secondary | ICD-10-CM | POA: Diagnosis not present

## 2019-09-25 ENCOUNTER — Ambulatory Visit: Payer: Commercial Managed Care - PPO

## 2019-09-25 ENCOUNTER — Ambulatory Visit
Admission: RE | Admit: 2019-09-25 | Discharge: 2019-09-25 | Disposition: A | Discharge status: Home / Self Care | Payer: Commercial Managed Care - PPO | Source: Ambulatory Visit | Attending: Radiation Oncology | Admitting: Radiation Oncology

## 2019-09-25 ENCOUNTER — Other Ambulatory Visit: Payer: Self-pay

## 2019-09-25 ENCOUNTER — Encounter: Payer: Self-pay | Admitting: *Deleted

## 2019-09-25 DIAGNOSIS — C50512 Malignant neoplasm of lower-outer quadrant of left female breast: Secondary | ICD-10-CM | POA: Diagnosis not present

## 2019-09-26 ENCOUNTER — Encounter: Payer: Self-pay | Admitting: Radiation Oncology

## 2019-09-26 ENCOUNTER — Other Ambulatory Visit: Payer: Self-pay

## 2019-09-26 ENCOUNTER — Ambulatory Visit
Admission: RE | Admit: 2019-09-26 | Discharge: 2019-09-26 | Disposition: A | Discharge status: Home / Self Care | Payer: Commercial Managed Care - PPO | Source: Ambulatory Visit | Attending: Radiation Oncology | Admitting: Radiation Oncology

## 2019-09-26 ENCOUNTER — Encounter: Payer: Self-pay | Admitting: Physician Assistant

## 2019-09-26 DIAGNOSIS — C50512 Malignant neoplasm of lower-outer quadrant of left female breast: Secondary | ICD-10-CM | POA: Diagnosis not present

## 2019-10-19 NOTE — Progress Notes (Signed)
  Radiation Oncology         [phone]) [phone] ________________________________  Name: Heather Hebert MRN: [id number]  Date: 09/26/2019  DOB: [birth year]  End of Treatment Note  Diagnosis:   left-sided breast cancer     Indication for treatment:  Curative       Radiation treatment dates:   08/09/19 - 09/26/19  Site/dose:   The patient initially received a dose of 50.4 Gy in 28 fractions to the breast and SCLV region using a 4-field approach. This was delivered using a 3-D conformal technique. The patient then received a boost to the seroma. This delivered an additional 10 Gy in 5 fractions using a 3-field photon boost technique. The total dose was 60.4 Gy.  Narrative: The patient tolerated radiation treatment relatively well.   The patient had some expected skin irritation as she progressed during treatment.   Plan: The patient has completed radiation treatment. The patient will return to radiation oncology clinic for routine followup in one month. I advised the patient to call or return sooner if they have any questions or concerns related to their recovery or treatment. ________________________________  Jodelle Gross, M.D., Ph.D.

## 2019-10-23 ENCOUNTER — Telehealth: Payer: Self-pay | Admitting: Radiation Oncology

## 2019-10-23 NOTE — Telephone Encounter (Signed)
  Radiation Oncology         [phone]) [phone] ________________________________  Name: Heather Hebert MRN: [id number]  Date of Service: 10/23/2019  DOB: 1960-09-28  Post Treatment Telephone Note  Diagnosis:  Stage IIB, cT1cN1M0 grade 3, triple negative invasive ductal carcinoma of the left breast.  Interval Since Last Radiation: 4 weeks   08/09/19 - 09/26/19: The patient initially received a dose of 50.4 Gy in 28 fractions to the breast and SCLV region using a 4-field approach. This was delivered using a 3-D conformal technique. The patient then received a boost to the seroma. This delivered an additional 10 Gy in 5 fractions using a 3-field photon boost technique. The total dose was 60.4 Gy.  Narrative:  The patient was contacted today for routine follow-up. During treatment she did very well with radiotherapy and did not have significant desquamation. She reports she is doing well. She has some soreness in her nipple and below her breast in the inframammary fold. Otherwise she's feeling well.  Impression/Plan: 1. Stage IIB, cT1cN1M0 grade 3, triple negative invasive ductal carcinoma of the left breast. The patient has been doing well since completion of radiotherapy. We discussed that we would be happy to continue to follow her as needed, but she will also continue to follow up with Dr. Lindi Adie in medical oncology. She was counseled on skin care as well as measures to avoid sun exposure to this area.  2. Right breast edema. The patient is currently in [place] and would like to get home in the next week or two to see how things are going. If her symptoms persist, she plans to call me. I offered the PT phone number to the clinic in [place], but she would prefer to hold off for now. She's also relocated to [facility] and may need a referral for PT closer to home.    Carola Rhine, PAC

## 2019-11-02 ENCOUNTER — Ambulatory Visit: Payer: Commercial Managed Care - PPO | Admitting: Physician Assistant

## 2019-11-06 ENCOUNTER — Telehealth: Payer: Self-pay | Admitting: Radiation Oncology

## 2019-11-06 DIAGNOSIS — Z171 Estrogen receptor negative status [ER-]: Secondary | ICD-10-CM

## 2019-11-06 NOTE — Telephone Encounter (Signed)
The patient called to let me know that she was still having concerns with swelling of her right breast, she is in town this Friday to see survivorship clinic, and would like to be able to see physical therapy if possible.  We discussed that while this would be an option to send her for urgent PT evaluation, she would likely need to be evaluated as well if lymphedema treatment is required at home, I gave her the number to [facility] and suggested she reach out to the breast cancer navigator who might be able to give her a better understanding of who treats their lymphedema patients.  She is in agreement with this plan.

## 2019-11-10 ENCOUNTER — Encounter: Payer: Self-pay | Admitting: Adult Health

## 2019-11-10 ENCOUNTER — Other Ambulatory Visit: Payer: Self-pay

## 2019-11-10 ENCOUNTER — Inpatient Hospital Stay: Payer: Commercial Managed Care - PPO | Attending: Hematology and Oncology | Admitting: Adult Health

## 2019-11-10 VITALS — BP 115/86 | HR 78 | Temp 97.1°F | Resp 18 | Ht 62.0 in | Wt 136.9 lb

## 2019-11-10 DIAGNOSIS — Z923 Personal history of irradiation: Secondary | ICD-10-CM | POA: Diagnosis not present

## 2019-11-10 DIAGNOSIS — Z9221 Personal history of antineoplastic chemotherapy: Secondary | ICD-10-CM | POA: Insufficient documentation

## 2019-11-10 DIAGNOSIS — C50512 Malignant neoplasm of lower-outer quadrant of left female breast: Secondary | ICD-10-CM | POA: Diagnosis present

## 2019-11-10 DIAGNOSIS — Z171 Estrogen receptor negative status [ER-]: Secondary | ICD-10-CM

## 2019-11-10 DIAGNOSIS — Z87891 Personal history of nicotine dependence: Secondary | ICD-10-CM | POA: Insufficient documentation

## 2019-11-10 NOTE — Progress Notes (Signed)
SURVIVORSHIP VISIT:    BRIEF ONCOLOGIC HISTORY:  Oncology History  Malignant neoplasm of lower-outer quadrant of left breast of female, estrogen receptor negative ([place])  12/07/2018 Initial Diagnosis   Patient palpated a lump in the left breast. Mammogram showed a 1.4cm mass at the 5 o'clock position in the left breast, a left axillary lymph node measuring 2.8cm, and 2 smaller axillary lymph nodes with cortical thickening. 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   12/14/2018 Cancer Staging   Staging form: Breast, AJCC 8th Edition - Clinical: Stage IIB (cT1c, cN1, cM0, G3, ER-, PR-, HER2-)   12/23/2018 - 03/24/2019 Chemotherapy   DOXOrubicin (ADRIAMYCIN) chemo injection 98 mg, 60 mg/m2 = 98 mg, Intravenous,  Once, 4 of 4 cycles. Dose modification: 50 mg/m2 (original dose 60 mg/m2, Cycle 2, Reason: Dose not tolerated). Administration: 98 mg (12/23/2018), 82 mg (01/06/2019), 82 mg (01/20/2019), 82 mg (02/02/2019)  palonosetron (ALOXI) injection 0.25 mg, 0.25 mg, Intravenous,  Once, 7 of 7 cycles. Administration: 0.25 mg (12/23/2018), 0.25 mg (02/17/2019), 0.25 mg (01/06/2019), 0.25 mg (01/20/2019), 0.25 mg (02/02/2019), 0.25 mg (03/31/2019), 0.25 mg (04/21/2019)  pegfilgrastim-jmdb (FULPHILA) injection 6 mg, 6 mg, Subcutaneous,  Once, 4 of 4 cycles. Administration: 6 mg (12/26/2018), 6 mg (01/09/2019), 6 mg (01/23/2019), 6 mg (02/04/2019)  CARBOplatin (PARAPLATIN) 570 mg in sodium chloride 0.9 % 250 mL chemo infusion, 570 mg (105.4 % of original dose 544.2 mg), Intravenous,  Once, 1 of 1 cycle. Dose modification:   (original dose 544.2 mg, Cycle 5). Administration: 570 mg (02/17/2019)  cyclophosphamide (CYTOXAN) 980 mg in sodium chloride 0.9 % 250 mL chemo infusion, 600 mg/m2 = 980 mg, Intravenous,  Once, 4 of 4 cycles. Dose modification: 500 mg/m2 (original dose 600 mg/m2, Cycle 2, Reason: Dose not tolerated). Administration: 980 mg (12/23/2018), 820 mg  (01/06/2019), 820 mg (01/20/2019), 820 mg (02/02/2019)  PACLitaxel (TAXOL) 132 mg in sodium chloride 0.9 % 250 mL chemo infusion (</= 66m/m2), 80 mg/m2 = 132 mg, Intravenous,  Once, 4 of 4 cycles. Dose modification: 65 mg/m2 (original dose 80 mg/m2, Cycle 5, Reason: Provider Judgment), 50 mg/m2 (original dose 80 mg/m2, Cycle 6, Reason: Provider Judgment), 65 mg/m2 (original dose 80 mg/m2, Cycle 6, Reason: Provider Judgment). Administration: 132 mg (02/17/2019), 108 mg (02/24/2019), 84 mg (03/31/2019), 84 mg (04/07/2019), 108 mg (04/14/2019), 108 mg (04/21/2019), 108 mg (04/28/2019), 108 mg (05/05/2019), 108 mg (05/12/2019), 108 mg (05/19/2019), 108 mg (05/26/2019), 84 mg (03/24/2019)  fosaprepitant (EMEND) 150 mg  dexamethasone (DECADRON) 12 mg in sodium chloride 0.9 % 145 mL IVPB, , Intravenous,  Once, 7 of 7 cycles. Administration:  (12/23/2018),  (02/17/2019),  (01/06/2019),  (01/20/2019),  (02/02/2019),  (03/31/2019),  (04/21/2019).   07/04/2019 Surgery   Left lumpectomy (Barry Dienes ([phone]: no residual carcinoma and 13 left axillary lymph nodes negative for carcinoma.   08/09/2019 - 09/26/2019 Radiation Therapy   The patient initially received a dose of 50.4 Gy in 28 fractions to the breast and supraclavicular region using whole-breast tangent fields. This was delivered using a 3-D conformal technique. The pt received a boost delivering an additional 10 Gy in 5 fractions using a electron boost with 154m electrons. The total dose was 60.4 Gy.     INTERVAL HISTORY:  [name] review her survivorship care plan detailing her treatment course for breast cancer, as well as monitoring long-term side effects of that treatment, education regarding health maintenance, screening, and overall wellness and health promotion.  Overall, Heather Hebert reports feeling quite well.  She has some mild swelling in her left breast and this is not particularly bothersome.  She is performing exercises from her breast book which  are helping.  She has mild tenderness under her left nipple.  Her survey was otherwise good.    REVIEW OF SYSTEMS:  Review of Systems  Constitutional: Negative for appetite change, chills, fatigue and unexpected weight change.  HENT:   Negative for hearing loss and trouble swallowing.   Eyes: Negative for eye problems and icterus.  Respiratory: Negative for chest tightness, cough and shortness of breath.   Cardiovascular: Negative for chest pain, leg swelling and palpitations.  Gastrointestinal: Negative for abdominal distention, abdominal pain, constipation, diarrhea, nausea and vomiting.  Endocrine: Negative for hot flashes.  Skin: Negative for itching and rash.  Neurological: Negative for dizziness, extremity weakness, headaches and numbness.  Hematological: Negative for adenopathy.  Psychiatric/Behavioral: Negative for depression. The patient is not nervous/anxious.   Breast: Denies any new nodularity, masses, tenderness, nipple changes, or nipple discharge.      ONCOLOGY TREATMENT TEAM:  1. Surgeon:  Dr. Barry Dienes at [facility] 2. Medical Oncologist: Dr. Lindi Adie  3. Radiation Oncologist: Dr. Lisbeth Renshaw    PAST MEDICAL/SURGICAL HISTORY:  Past Medical History:  Diagnosis Date  . Breast cancer ([place]) 2020   Left  . Cancer ([place]) 11/2018   left breast ICD  . Family history of breast cancer    Past Surgical History:  Procedure Laterality Date  . APPENDECTOMY    . BREAST LUMPECTOMY WITH RADIOACTIVE SEED AND AXILLARY LYMPH NODE DISSECTION Left 07/04/2019   Procedure: LEFT BREAST LUMPECTOMY WITH RADIOACTIVE SEED AND AXILLARY LYMPH NODE DISSECTION;  Surgeon: [name], MD;  Location: [place];  Service: General;  Laterality: Left;  PEC BLOCK  . CESAREAN SECTION    . LAPAROSCOPIC APPENDECTOMY N/A 08/05/2015   Procedure: APPENDECTOMY LAPAROSCOPIC;  Surgeon: Mickeal Skinner, MD;  Location: [place];  Service: General;  Laterality: N/A;  . PORT-A-CATH REMOVAL Left 07/04/2019    Procedure: REMOVAL PORT-A-CATH;  Surgeon: [name], MD;  Location: [place];  Service: General;  Laterality: Left;  . PORTACATH PLACEMENT Left 12/22/2018   Procedure: INSERTION PORT-A-CATH;  Surgeon: [name], MD;  Location: [place];  Service: General;  Laterality: Left;  . PUNCH BIOPSY OF SKIN Right 07/04/2019   Procedure: Punch Biopsy Of Skin;  Surgeon: [name], MD;  Location: [place];  Service: General;  Laterality: Right;     ALLERGIES:  No Known Allergies   CURRENT MEDICATIONS:  Outpatient Encounter Medications as of 11/10/2019  Medication Sig  . bismuth subsalicylate (PEPTO BISMOL) 262 MG chewable tablet Chew 262-524 mg by mouth as needed for indigestion.  . Calcium Carb-Cholecalciferol (CALCIUM 600 + D PO) Take 1 tablet by mouth 2 (two) times daily.  . cetirizine (ZYRTEC) 10 MG tablet Take 10 mg by mouth daily.  . clotrimazole-betamethasone (LOTRISONE) cream Apply 1 application topically 2 (two) times daily.  Marland Kitchen desoximetasone (TOPICORT) 0.25 % cream Apply to affected area  . fluconazole (DIFLUCAN) 100 MG tablet Take 2 tablets (200 mg total) by mouth daily.  . Hypertonic Nasal Wash (SINUS RINSE KIT NA) Place 1 spray into the nose daily as needed (congestion).  . Melatonin 5 MG CAPS Take 5-10 mg by mouth at bedtime as needed (sleep).  . Multiple Vitamin (MULTIVITAMIN WITH MINERALS) TABS tablet Take 2 tablets by mouth daily.   . Olopatadine HCl (PATADAY OP) Place 2 drops  into both eyes daily as needed (dry eyes).   . Probiotic Product (PROBIOTIC PO) Take 1 tablet by mouth daily.   . urea (CARMOL) 40 % CREA Apply 1 application topically daily as needed (dry skin / rash).   No facility-administered encounter medications on file as of 11/10/2019.     ONCOLOGIC FAMILY HISTORY:  Family History  Problem Relation Age of Onset  . Breast cancer Cousin        first cousin once removed (father's cousin)     GENETIC COUNSELING/TESTING: discussed updated NCCN  recommendations for testing. Declines right now, will think about it.    SOCIAL HISTORY:  Social History   Socioeconomic History  . Marital status: Married    Spouse name: Not on file  . Number of children: Not on file  . Years of education: Not on file  . Highest education level: Not on file  Occupational History  . Not on file  Tobacco Use  . Smoking status: Former Research scientist (life sciences)  . Smokeless tobacco: Never Used  Vaping Use  . Vaping Use: Never used  Substance and Sexual Activity  . Alcohol use: Yes    Comment: "once a month"  . Drug use: No  . Sexual activity: Not on file  Other Topics Concern  . Not on file  Social History Narrative  . Not on file   Social Determinants of Health   Financial Resource Strain:   . Difficulty of Paying Living Expenses: Not on file  Food Insecurity:   . Worried About Charity fundraiser in the Last Year: Not on file  . Ran Out of Food in the Last Year: Not on file  Transportation Needs:   . Lack of Transportation (Medical): Not on file  . Lack of Transportation (Non-Medical): Not on file  Physical Activity:   . Days of Exercise per Week: Not on file  . Minutes of Exercise per Session: Not on file  Stress:   . Feeling of Stress : Not on file  Social Connections:   . Frequency of Communication with Friends and Family: Not on file  . Frequency of Social Gatherings with Friends and Family: Not on file  . Attends Religious Services: Not on file  . Active Member of Clubs or Organizations: Not on file  . Attends Archivist Meetings: Not on file  . Marital Status: Not on file  Intimate Partner Violence:   . Fear of Current or Ex-Partner: Not on file  . Emotionally Abused: Not on file  . Physically Abused: Not on file  . Sexually Abused: Not on file     OBSERVATIONS/OBJECTIVE:  BP 115/86 (BP Location: Right Arm, Patient Position: Sitting)   Pulse 78   Temp (!) 97.1 F (36.2 C) (Tympanic)   Resp 18   Ht '5\' 2"'  (1.575 m)   Wt 136  lb 14.4 oz (62.1 kg)   SpO2 100% Comment: room air  BMI 25.04 kg/m  GENERAL: Patient is a well appearing female in no acute distress HEENT:  Sclerae anicteric.  Oropharynx clear and moist. No ulcerations or evidence of oropharyngeal candidiasis. Neck is supple.  NODES:  No cervical, supraclavicular, or axillary lymphadenopathy palpated.  BREAST EXAM:   LUNGS:  Clear to auscultation bilaterally.  No wheezes or rhonchi. HEART:  Regular rate and rhythm. No murmur appreciated. ABDOMEN:  Soft, nontender.  Positive, normoactive bowel sounds. No organomegaly palpated. MSK:  No focal spinal tenderness to palpation. Full range of motion bilaterally in the upper  extremities. EXTREMITIES:  No peripheral edema.   SKIN:  Clear with no obvious rashes or skin changes. No nail dyscrasia. NEURO:  Nonfocal. Well oriented.  Appropriate affect.    LABORATORY DATA:  None for this visit.  DIAGNOSTIC IMAGING:  None for this visit.      ASSESSMENT AND PLAN:  Ms.. Hebert is a pleasant 59 y.o. female with Stage IIB left breast invasive ductal carcinoma, ER-/PR-/HER2-, diagnosed in 11/2018, treated with neoadjuvant chemotherapy, lumpectomy, adjuvant radiation therapy.  She presents to the Survivorship Clinic for our initial meeting and routine follow-up post-completion of treatment for breast cancer.    1. Stage IIB left breast cancer:  Ms. Blea is continuing to recover from definitive treatment for breast cancer. She will follow-up with her medical oncologist, Dr. Lindi Adie in 03/2020 with history and physical exam per surveillance protocol.  Her mammogram is due 03/2020; orders placed today. Today, a comprehensive survivorship care plan and treatment summary was reviewed with the patient today detailing her breast cancer diagnosis, treatment course, potential late/long-term effects of treatment, appropriate follow-up care with recommendations for the future, and patient education resources.  A copy of this  summary, along with a letter will be sent to the patient's primary care provider via mail/fax/In Basket message after today's visit.    2. Mild Breast swelling: I recommended she apply vitamin e oil to her breast with light pressure massage.    3. Bone health:  She was given education on specific activities to promote bone health.  4. Cancer screening:  Due to Ms. Prescott's history and her age, she should receive screening for skin cancers, colon cancer, and gynecologic cancers.  The information and recommendations are listed on the patient's comprehensive care plan/treatment summary and were reviewed in detail with the patient.    5. Health maintenance and wellness promotion: Ms. Kaatz was encouraged to consume 5-7 servings of fruits and vegetables per day. We reviewed the "Nutrition Rainbow" handout, as well as the handout "Take Control of Your Health and Reduce Your Cancer Risk" from the [place].  She was also encouraged to engage in moderate to vigorous exercise for 30 minutes per day most days of the week. We discussed the [employer] fitness program, which is designed for cancer survivors to help them become more physically fit after cancer treatments.  She was instructed to limit her alcohol consumption and continue to abstain from tobacco use.    6. Support services/counseling: It is not uncommon for this period of the patient's cancer care trajectory to be one of many emotions and stressors.  We discussed how this can be increasingly difficult during the times of quarantine and social distancing due to the COVID-19 pandemic.   She was given information regarding our available services and encouraged to contact me with any questions or for help enrolling in any of our support group/programs.    Follow up instructions:    -Return to cancer center in 03/2020 for f/u with Dr. Lindi Adie  -Mammogram due in 03/2020 -Follow up with surgery 09/2020 -She is welcome to return back to  the Survivorship Clinic at any time; no additional follow-up needed at this time.  -Consider referral back to survivorship as a long-term survivor for continued surveillance  The patient was provided an opportunity to ask questions and all were answered. The patient agreed with the plan and demonstrated an understanding of the instructions.   Total encounter time: 30 minutes*  Wilber Bihari, NP 11/10/19 11:03 AM Medical Oncology  and Hematology [facility] [place], [place] [address] Tel. [phone]    Fax. [phone]  *Total Encounter Time as defined by the Centers for Medicare and Medicaid Services includes, in addition to the face-to-face time of a patient visit (documented in the note above) non-face-to-face time: obtaining and reviewing outside history, ordering and reviewing medications, tests or procedures, care coordination (communications with other health care professionals or caregivers) and documentation in the medical record.

## 2019-11-14 ENCOUNTER — Telehealth: Payer: Self-pay | Admitting: Adult Health

## 2019-11-14 NOTE — Telephone Encounter (Signed)
Scheduled per 10/1 los. Called and spoke with pt, confirmed 2/4 appt

## 2020-01-31 ENCOUNTER — Telehealth: Payer: Self-pay | Admitting: Hematology and Oncology

## 2020-01-31 NOTE — Telephone Encounter (Signed)
Rescheduled appointment due to provider PAL. Patient is aware of changes. 

## 2020-03-06 ENCOUNTER — Telehealth: Payer: Self-pay | Admitting: Hematology and Oncology

## 2020-03-06 NOTE — Telephone Encounter (Signed)
Rescheduled appt per 1/25 sch msg - pt is aware of appt date and time

## 2020-03-13 ENCOUNTER — Ambulatory Visit
Admission: RE | Admit: 2020-03-13 | Discharge: 2020-03-13 | Disposition: A | Discharge status: Home / Self Care | Payer: Commercial Managed Care - PPO | Source: Ambulatory Visit | Attending: Adult Health | Admitting: Adult Health

## 2020-03-13 ENCOUNTER — Ambulatory Visit: Payer: Commercial Managed Care - PPO | Attending: Nurse Practitioner

## 2020-03-13 ENCOUNTER — Other Ambulatory Visit: Payer: Self-pay

## 2020-03-13 DIAGNOSIS — R600 Localized edema: Secondary | ICD-10-CM | POA: Diagnosis present

## 2020-03-13 DIAGNOSIS — Z171 Estrogen receptor negative status [ER-]: Secondary | ICD-10-CM

## 2020-03-13 DIAGNOSIS — C50512 Malignant neoplasm of lower-outer quadrant of left female breast: Secondary | ICD-10-CM

## 2020-03-13 DIAGNOSIS — R2230 Localized swelling, mass and lump, unspecified upper limb: Secondary | ICD-10-CM

## 2020-03-13 DIAGNOSIS — R222 Localized swelling, mass and lump, trunk: Secondary | ICD-10-CM | POA: Diagnosis present

## 2020-03-13 NOTE — Therapy (Signed)
[place], [place], [address] Phone: [phone]   Fax:  [phone]  Physical Therapy Evaluation  Patient Details  Name: Heather Hebert MRN: [id number] Date of Birth: [birth year] Referring Provider (PT): Worthy Flank, [place]   Encounter Date: 03/13/2020   PT End of Session - 03/13/20 1253    Visit Number 1    Number of Visits 4    Date for PT Re-Evaluation 06/05/20    PT Start Time 1103    PT Stop Time 1200    PT Time Calculation (min) 57 min    Activity Tolerance Patient tolerated treatment well    Behavior During Therapy [facility] for tasks assessed/performed           Past Medical History:  Diagnosis Date  . Breast cancer ([place]) 2020   Left  . Cancer ([place]) 11/2018   left breast ICD  . Family history of breast cancer     Past Surgical History:  Procedure Laterality Date  . APPENDECTOMY    . BREAST LUMPECTOMY WITH RADIOACTIVE SEED AND AXILLARY LYMPH NODE DISSECTION Left 07/04/2019   Procedure: LEFT BREAST LUMPECTOMY WITH RADIOACTIVE SEED AND AXILLARY LYMPH NODE DISSECTION;  Surgeon: [name], MD;  Location: [place];  Service: General;  Laterality: Left;  PEC BLOCK  . CESAREAN SECTION    . LAPAROSCOPIC APPENDECTOMY N/A 08/05/2015   Procedure: APPENDECTOMY LAPAROSCOPIC;  Surgeon: Mickeal Skinner, MD;  Location: [place];  Service: General;  Laterality: N/A;  . PORT-A-CATH REMOVAL Left 07/04/2019   Procedure: REMOVAL PORT-A-CATH;  Surgeon: [name], MD;  Location: [place];  Service: General;  Laterality: Left;  . PORTACATH PLACEMENT Left 12/22/2018   Procedure: INSERTION PORT-A-CATH;  Surgeon: [name], MD;  Location: [place];  Service: General;  Laterality: Left;  . PUNCH BIOPSY OF SKIN Right 07/04/2019   Procedure: Punch Biopsy Of Skin;  Surgeon: [name], MD;  Location: [place];  Service: General;  Laterality: Right;    There were no vitals filed for this  visit.    Subjective Assessment - 03/13/20 1102    Subjective Saw Dr. Barry Dienes last week who wanted her to come for PT for left breast swelling.  She notices some firmness and the nipple looks different.  Up until December she had a little nipple discharge but it has stopped now and the MD is not worried about it.  Still feels a little tight under left axillary region. she is now living in [facility] and it had previously been recommended that she speak with someone at [facility]    Pertinent History Pt. was diagnosed in October 2020.  she had neoadjuvant chemo 12/23/18- 03/24/19,  Left Lumpectomy with axillary node dissection was performed by Dr. Barry Dienes on 07/04/19.  There were 13 negative nodes at that time. She had  radiation in June 2021.  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 No/denies              [facility] PT Assessment - 03/13/20 0001      Assessment   Medical Diagnosis Left breast CA    Referring Provider (PT) Ebony Hail    Onset Date/Surgical Date 06/30/19    Hand Dominance Right    Prior Therapy no      Precautions   Precaution Comments lymphedema risk  Restrictions   Weight Bearing Restrictions No    Other Position/Activity Restrictions No      Balance Screen   Has the patient fallen in the past 6 months No    Has the patient had a decrease in activity level because of a fear of falling?  No    Is the patient reluctant to leave their home because of a fear of falling?  No      Home Environment   Living Environment Private residence    Living Arrangements Spouse/significant other    Available Help at Discharge Family    Type of [place] Access Level entry    [place] One level      Prior Function   Level of [place] Retired    Leisure ride bike, read, Firefighter, walking      Cognition   Overall  Cognitive Status Within Functional Limits for tasks assessed      Observation/Other Assessments   Skin Integrity slightly enlarged pores left breast      Observation/Other Assessments-Edema    Edema --   swelling left lateral trunk, left nipple area and inferior breast     Posture/Postural Control   Posture/Postural Control Postural limitations    Postural Limitations Rounded Shoulders      AROM   Right Shoulder Extension 75 Degrees    Right Shoulder Flexion 165 Degrees    Right Shoulder ABduction 180 Degrees    Left Shoulder Extension 70 Degrees    Left Shoulder Flexion 154 Degrees    Left Shoulder ABduction 162 Degrees             LYMPHEDEMA/ONCOLOGY QUESTIONNAIRE - 03/13/20 0001      Surgeries   Lumpectomy Date 07/04/19    Axillary Lymph Node Dissection Date 07/04/19    Number Lymph Nodes Removed 13      Treatment   Active Chemotherapy Treatment No    Past Chemotherapy Treatment Yes    Date --   03/24/19   Active Radiation Treatment No    Past Radiation Treatment Yes    Date --   08/09/19   Current Hormone Treatment No    Past Hormone Therapy No      What other symptoms do you have   Are you Having Heaviness or Tightness Yes    Are you having Pain No    Are you having pitting edema No    Is it Hard or Difficult finding clothes that fit No    Do you have infections No    Is there Decreased scar mobility Yes      Right Upper Extremity Lymphedema   At Axilla  31.5 cm    10 cm Proximal to Olecranon Process 28 cm    Olecranon Process 25.2 cm    10 cm Proximal to Ulnar Styloid Process 21.9 cm    Just Proximal to Ulnar Styloid Process 16.7 cm    Across Hand at [employer] 19.8 cm    At [place] of 2nd Digit 6.8 cm      Left Upper Extremity Lymphedema   15 cm Proximal to Olecranon Process 31 cm    10 cm Proximal to Olecranon Process 28.7 cm    Olecranon Process 25.7 cm    10 cm Proximal to Ulnar Styloid Process 21.5 cm    Just Proximal to Ulnar Styloid Process  16.7 cm    Across Hand at [employer] 19.7  cm    At [facility] of 2nd Digit 6.7 cm                   Objective measurements completed on examination: See above findings.       [place] Adult PT Treatment/Exercise - 03/13/20 0001      Manual Therapy   Manual Lymphatic Drainage (MLD) therapist performed MLD on pt to supraclavicular, right axillary LN, left inguinal LN, anterior interaxillary pathway, left axilloinguinal pathway and after performing and instructing pt, had her perform on herself.                  PT Education - 03/13/20 1252    Education Details Pt was instructed in LN activation and trunk pathways as well as diaphragmatic breathing for MLD to left breast, but did not include breast secondary to time.  She was given a handout and did very well.  she will be instructed in Left breast MLD tomorrow    Person(s) Educated Patient    Methods Explanation;Demonstration;Handout    Comprehension Verbalized understanding;Returned demonstration;Need further instruction               PT Long Term Goals - 03/13/20 1525      PT LONG TERM GOAL #1   Title Pt will be independent with Self MLD of the left breast.    Time 4    Period Weeks    Status New    Target Date 04/09/20      PT LONG TERM GOAL #2   Title Pt will arrange lymphedema contact in [facility] (to try [facility])    Time 6    Period Weeks    Status New      PT LONG TERM GOAL #3   Title Pt will be fit for compression sleeve/gauntlet and bra    Time 4    Period Weeks    Status New    Target Date 04/10/20      PT LONG TERM GOAL #4   Title Pt will attend ABC class virtually to become knowlegeable in Lymphedema and precautions    Time 6    Period Weeks    Status New    Target Date 04/24/20      PT LONG TERM GOAL #5   Title Pt will improve left shoulder flex and abd by 5-10 degrees to decrease tightness    Time 12    Period Weeks    Status New    Target Date  06/05/20                  Plan - 03/13/20 1602    Clinical Impression Statement Pt lives in [place] and has been given [facility] as a possible place to follow up for Breast Lymphedema.  She was instructed today in LN activation and pathways to practice tonight, before working on breast tissue tomorrow in PT.  She does have mild end range tightness in left shoulder flex and abduction.  She has no measureable difference in circumference of her arms.  She will benefit from a compression bra, prophylactic sleeve and gauntlet, and possible pump. She would alos benefit from scar massage and attending our virtual ABC class    Examination-Activity Limitations Sit    Stability/Clinical Decision Making Stable/Uncomplicated    Rehab Potential Good    PT Frequency 1x / week   if pt is in the area since she lives in [place]  PT Duration 8 weeks    PT Treatment/Interventions Therapeutic exercise;Manual techniques;Patient/family education;Manual lymph drainage;Scar mobilization;Passive range of motion    PT Next Visit Plan Breast MLD, exs for end range abd/flex,Sign up for ABC class, give info sheet Bradlee Bridgers did    PT Home Exercise Plan practice LN activation and pathways given by Chianne Byrns    Consulted and Agree with Plan of Care Patient           Patient will benefit from skilled therapeutic intervention in order to improve the following deficits and impairments:  Decreased scar mobility,Postural dysfunction,Decreased range of motion,Decreased knowledge of precautions,Increased edema  Visit Diagnosis: Malignant neoplasm of lower-outer quadrant of left breast of female, estrogen receptor negative (HCC)  Axillary fullness  Localized edema     Problem List Patient Active Problem List   Diagnosis Date Noted  . Port-A-Cath in place 03/23/2019  . Genetic testing 12/15/2018  . Family history of breast cancer   . Malignant neoplasm of lower-outer quadrant of left  breast of female, estrogen receptor negative ([place]) 12/07/2018  . Acute appendicitis with localized peritonitis 08/05/2015    Claris Pong 03/13/2020, 4:03 PM  [place], [place], [address] Phone: [phone]   Fax:  [phone]  Name: Heather Hebert MRN: [id number] Date of Birth: [birth year] Cheral Almas, PT 03/13/20 4:04 PM

## 2020-03-13 NOTE — Patient Instructions (Signed)
Manual Lymph Drainage for Left Breast.  Do daily.  Do slowly. Use flat hands with just enough pressure to stretch the skin. Do not slide over the skin, but move the skin with the hand you're using. Lie down or sit comfortably (in a recliner, for example) to do this.  1) Hug yourself:  cross arms and do circles at collar bones near neck 5-7 times (to "wake up" lots of lymph nodes in this area). 2) Take slow deep breaths, allowing your belly to balloon out as your breathe in, 5x (to "wake up" abdominal lymph nodes to take on extra fluid). 3) Right armpit-stretch skin in small circles to stimulate intact lymph nodes there, 5-7x. 4) Left groin area, at panty line-stretch skin in small circles to stimulate lymph nodes 5-7x. 5) Redirect fluid from left chest toward right armpit (stretch skin starting at left chest in 3-4 spots working toward right armpit) 3-4x across the chest. 6) Redirect fluid from left armpit toward left groin (cup your hand around the curve of your left side and do 3-4 "pumps" from armpit to groin) 3-4x down your side. 7) Draw an imaginary diagonal line from upper outer breast through the nipple area toward lower inner breast.  Direct fluid upward and inward from this line toward the pathway across your upper chest (established in #5).  Do this in three rows to treat all of the upper inner breast tissue, and do each row 3-4x. 8) Then repeat #5 above. 9) Direct fluid to treat all of lower outer breast tissue downward and outward toward pathway established in #6 that is aimed at the left groin. 10)  Then repeat #6 above. 11)  End with repeating #3 and #4 above.   [facility] [address] [place], [place]   [address] [phone] 

## 2020-03-14 ENCOUNTER — Ambulatory Visit: Payer: Commercial Managed Care - PPO

## 2020-03-14 DIAGNOSIS — Z171 Estrogen receptor negative status [ER-]: Secondary | ICD-10-CM

## 2020-03-14 DIAGNOSIS — C50512 Malignant neoplasm of lower-outer quadrant of left female breast: Secondary | ICD-10-CM | POA: Diagnosis not present

## 2020-03-14 DIAGNOSIS — R222 Localized swelling, mass and lump, trunk: Secondary | ICD-10-CM

## 2020-03-14 DIAGNOSIS — R600 Localized edema: Secondary | ICD-10-CM

## 2020-03-14 DIAGNOSIS — R2230 Localized swelling, mass and lump, unspecified upper limb: Secondary | ICD-10-CM

## 2020-03-14 NOTE — Therapy (Signed)
[place], [place], [address] Phone: [phone]   Fax:  [phone]  Physical Therapy Treatment  Patient Details  Name: Heather Hebert MRN: [id number] Date of Birth: Jan 07, 1961 Referring Provider (PT): Mardella Layman Date: 03/14/2020   PT End of Session - 03/14/20 1655    Visit Number 2    Number of Visits 4    Date for PT Re-Evaluation 06/05/20    PT Start Time 0803    PT Stop Time 0904    PT Time Calculation (min) 61 min    Activity Tolerance Patient tolerated treatment well    Behavior During Therapy [facility] for tasks assessed/performed           Past Medical History:  Diagnosis Date  . Breast cancer ([place]) 2020   Left  . Cancer ([place]) 11/2018   left breast ICD  . Family history of breast cancer     Past Surgical History:  Procedure Laterality Date  . APPENDECTOMY    . BREAST LUMPECTOMY WITH RADIOACTIVE SEED AND AXILLARY LYMPH NODE DISSECTION Left 07/04/2019   Procedure: LEFT BREAST LUMPECTOMY WITH RADIOACTIVE SEED AND AXILLARY LYMPH NODE DISSECTION;  Surgeon: [name], MD;  Location: [place];  Service: General;  Laterality: Left;  PEC BLOCK  . CESAREAN SECTION    . LAPAROSCOPIC APPENDECTOMY N/A 08/05/2015   Procedure: APPENDECTOMY LAPAROSCOPIC;  Surgeon: Mickeal Skinner, MD;  Location: [place];  Service: General;  Laterality: N/A;  . PORT-A-CATH REMOVAL Left 07/04/2019   Procedure: REMOVAL PORT-A-CATH;  Surgeon: [name], MD;  Location: [place];  Service: General;  Laterality: Left;  . PORTACATH PLACEMENT Left 12/22/2018   Procedure: INSERTION PORT-A-CATH;  Surgeon: [name], MD;  Location: [place];  Service: General;  Laterality: Left;  . PUNCH BIOPSY OF SKIN Right 07/04/2019   Procedure: Punch Biopsy Of Skin;  Surgeon: [name], MD;  Location: [place];  Service: General;  Laterality: Right;    There were no vitals filed for this visit.   Subjective  Assessment - 03/14/20 0808    Subjective I tried the massage a few times last night and this morning and I think I did well with that.    Pertinent History Pt. was diagnosed in October 2020.  she had neoadjuvant chemo 12/23/18- 03/24/19,  Left Lumpectomy with axillary node dissection was performed by Dr. Barry Dienes on 07/04/19.  There were 13 negative nodes at that time. She had  radiation in June 2021.  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 No/denies                             [facility] Adult PT Treatment/Exercise - 03/14/20 0001      Self-Care   Self-Care Other Self-Care Comments    Other Self-Care Comments  Spent beginning of session reviewing and answering her questions regarding basics of anatomy of lymphatic system and principles of MLD. Educated her in the importance of being compliant with self MLD 1-2x/daily, wear of compression bra daily once she gets one. Advised her to call [facility] to find out where she can get compression garments (bra, prophylactic sleeve and gauntlet) once she gets back home as she is heading back to [facility] today and will not have time to get them  before she leaves.      Manual Therapy   Manual Therapy Manual Lymphatic Drainage (MLD);Passive ROM;Myofascial release    Myofascial Release To Lt axilla during P/ROM at area of cording instructing pt how to repeat same self MFR at home with end ROM stretching in doorway    Manual Lymphatic Drainage (MLD) In Supine: Short neck, superficial and deep abdominals, Lt inguinal and Rt axillary nodes, Lt axillo-inguinal anastomosis, Rt intact upper quadrant sequence, anterior inter-axillary anastomosis, then focused on Lt breast reviewing technique with pt having her return demo; also had pt in Rt S/L for more focus to laterla and inferior breast where fibrosis palpable. Instructed pt in self MLD in  this position as well. Finished retracing all steps in supine.    Passive ROM In Supine to Lt shoulder into flexion, abducton and D2 to pts available motions                       PT Long Term Goals - 03/13/20 1525      PT LONG TERM GOAL #1   Title Pt will be independent with Self MLD of the left breast.    Time 4    Period Weeks    Status New    Target Date 04/09/20      PT LONG TERM GOAL #2   Title Pt will arrange lymphedema contact in [place] (to try [facility])    Time 6    Period Weeks    Status New      PT LONG TERM GOAL #3   Title Pt will be fit for compression sleeve/gauntlet and bra    Time 4    Period Weeks    Status New    Target Date 04/10/20      PT LONG TERM GOAL #4   Title Pt will attend ABC class virtually to become knowlegeable in Lymphedema and precautions    Time 6    Period Weeks    Status New    Target Date 04/24/20      PT LONG TERM GOAL #5   Title Pt will improve left shoulder flex and abd by 5-10 degrees to decrease tightness    Time 12    Period Weeks    Status New    Target Date 06/05/20                 Plan - 03/14/20 1655    Clinical Impression Statement Continued with review of manual lymph drainage while therapist performed initially and then had pt return demo. She did well with return demo of anastomosis but for the breast sequence required mod tactile and VCs for lighter pressure and to stretch, not slide, on skin. She was able to return correct demo after multiple trials. Educated pt on importance of performing self MLD 1-2x/daily, getting and wearing a compression bra ASAP once she gets back to [facility], benefit of getting and having a compression sleeve and gauntlet for activities that increaes her HR due to having a ALND. Got pt scheduled to attend virtual ABC class so she'll be educated on lymphedema risk reduction practices and infection prevention. Also educated pt in how to perform  self MFR to Lt axilla where cording was plapated and educated her on same. Pt is returning home to [facility] and knows she can call with any further questions after all she was educated in yesterday and todays sessions. She plans to be back  in [place] in April for a mammogram and may return to our clinic for a follow up visit at that time. Pt verbalized good uderstanding of all today.    Examination-Activity Limitations Sit    Stability/Clinical Decision Making Stable/Uncomplicated    Rehab Potential Good    PT Frequency 1x / week    PT Duration 8 weeks    PT Treatment/Interventions Therapeutic exercise;Manual techniques;Patient/family education;Manual lymph drainage;Scar mobilization;Passive range of motion    PT Next Visit Plan Breast MLD, exs for end range abd/flex    PT Home Exercise Plan Self MLD 1-2x/daily, get and wear compression bra (daily), sleeve and gauntlet (with exercise/flying), end ROM stretching in doorway with and without self MFR    Recommended Other Services Compression bra, prophylactic sleeve/gauntlet    Consulted and Agree with Plan of Care Patient           Patient will benefit from skilled therapeutic intervention in order to improve the following deficits and impairments:  Decreased scar mobility,Postural dysfunction,Decreased range of motion,Decreased knowledge of precautions,Increased edema  Visit Diagnosis: Malignant neoplasm of lower-outer quadrant of left breast of female, estrogen receptor negative (HCC)  Axillary fullness  Localized edema     Problem List Patient Active Problem List   Diagnosis Date Noted  . Port-A-Cath in place 03/23/2019  . Genetic testing 12/15/2018  . Family history of breast cancer   . Malignant neoplasm of lower-outer quadrant of left breast of female, estrogen receptor negative ([place]) 12/07/2018  . Acute appendicitis with localized peritonitis 08/05/2015    Otelia Limes, PTA 03/14/2020, 5:21 PM  [place], [place], [address] Phone: [phone]   Fax:  [phone]  Name: Heather Hebert MRN: [id number] Date of Birth: 03-Apr-1960

## 2020-03-15 ENCOUNTER — Ambulatory Visit: Payer: Commercial Managed Care - PPO | Admitting: Hematology and Oncology

## 2020-03-19 ENCOUNTER — Inpatient Hospital Stay: Payer: Commercial Managed Care - PPO | Admitting: Hematology and Oncology

## 2020-05-08 DIAGNOSIS — Z853 Personal history of malignant neoplasm of breast: Secondary | ICD-10-CM | POA: Insufficient documentation

## 2020-05-09 DIAGNOSIS — Z8719 Personal history of other diseases of the digestive system: Secondary | ICD-10-CM | POA: Insufficient documentation

## 2020-05-20 ENCOUNTER — Other Ambulatory Visit: Payer: Self-pay

## 2020-05-20 ENCOUNTER — Ambulatory Visit
Admission: RE | Admit: 2020-05-20 | Discharge: 2020-05-20 | Disposition: A | Discharge status: Home / Self Care | Payer: Commercial Managed Care - PPO | Source: Ambulatory Visit | Attending: Adult Health | Admitting: Adult Health

## 2020-07-13 IMAGING — MR MR BREAST BILAT WO/W CM
8 of 12 series · 33 of 48 positions shown · IV contrast (6ml Gadavist)
Comparison: Previous exam(s).

CLINICAL DATA: 58-year-old female with biopsy proven left breast
status post neoadjuvant chemotherapy.

LABS:  None performed on site.
EXAM:
BILATERAL BREAST MRI WITH AND WITHOUT CONTRAST
TECHNIQUE: Multiplanar, multisequence MR images of both breasts were obtained
prior to and following the intravenous administration of 6 ml of
Gadavist.

[Series 2: t2_tirm_tra ipat (a-p) · axial · 3.0mm · 0.70mm/px · 1 of 59 slices shown]
[im 1/59]
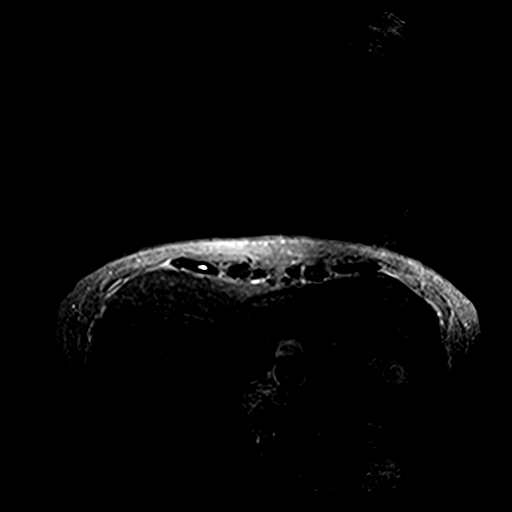

[Series 3: fl3d pre-cm no · axial · non-contrast · 1.2mm · 0.94mm/px · z∈[-102,+70]mm · 5 of 144 slices shown]
[im 1/144]
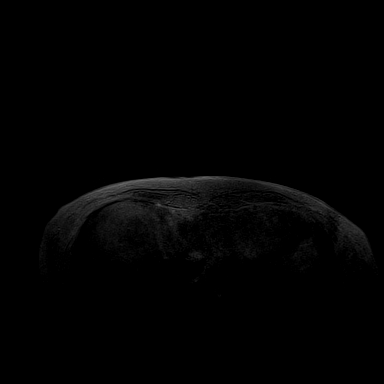
[im 36/144]
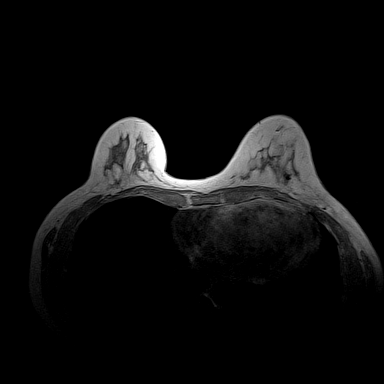
[im 72/144]
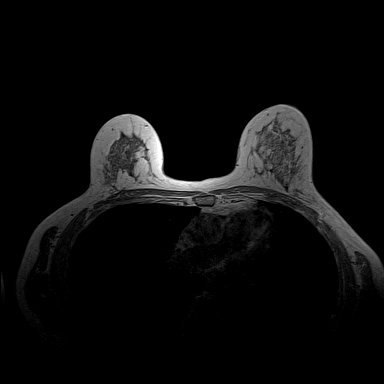
[im 108/144]
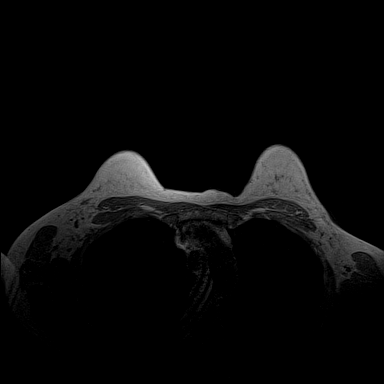
[im 144/144]
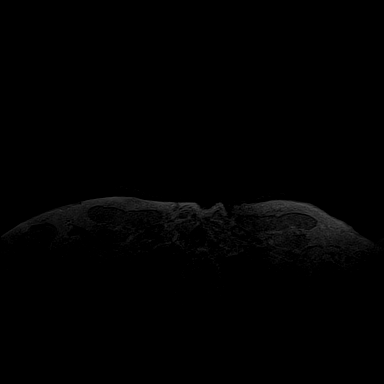

[Series 4: fl3d pre-cm · axial · non-contrast · 1.2mm · 0.94mm/px · z∈[-102,+70]mm · 5 of 144 slices shown]
[im 1/144]
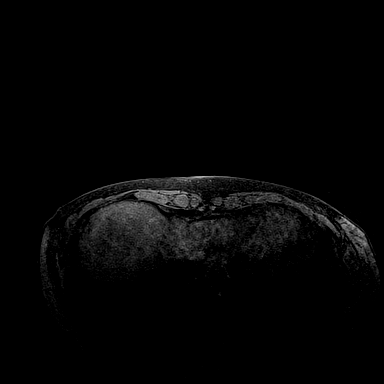
[im 36/144]
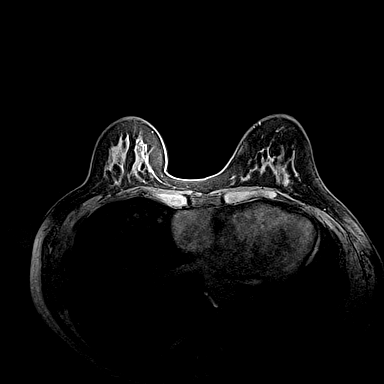
[im 72/144]
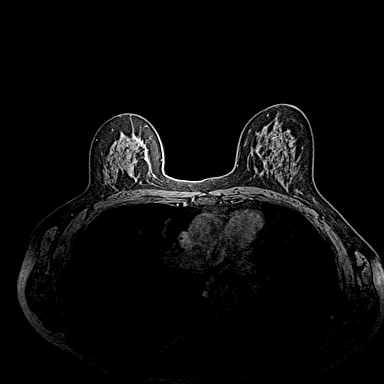
[im 108/144]
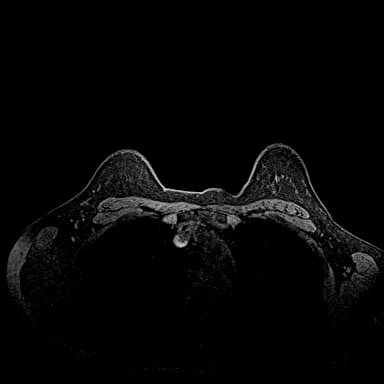
[im 144/144]
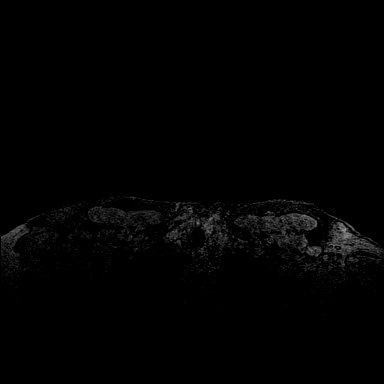

[Series 5: fl3d post-cm 20 · axial · 1.2mm · 0.94mm/px · z∈[-102,+70]mm · 5 of 144 slices shown (1 of 3)]
[im 1/144]
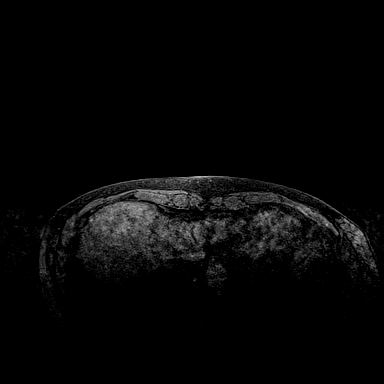
[im 36/144]
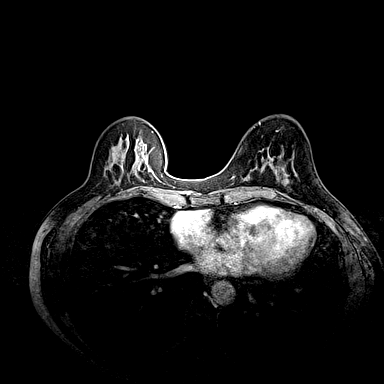
[im 72/144]
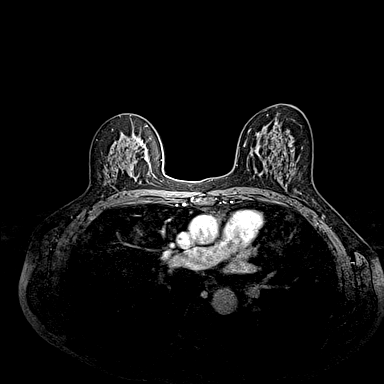
[im 108/144]
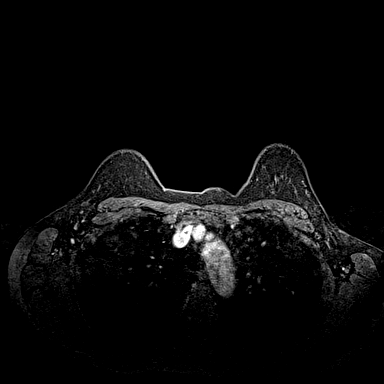
[im 144/144]
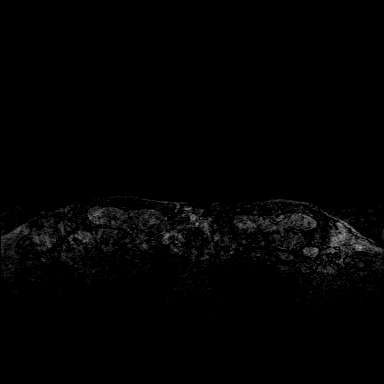

[Series 6: fl3d post-cm 20 · axial · 1.2mm · 0.94mm/px · z∈[-102,+70]mm · 5 of 144 slices shown (2 of 3)]
[im 1/144]
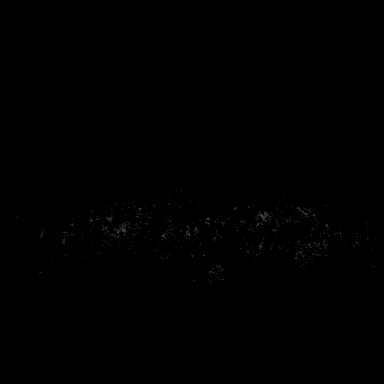
[im 36/144]
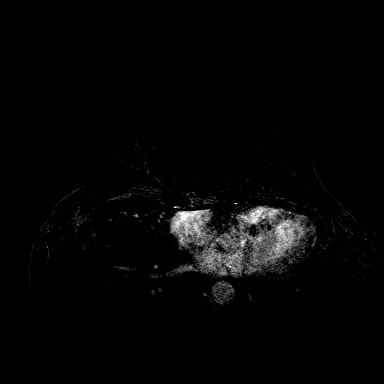
[im 72/144]
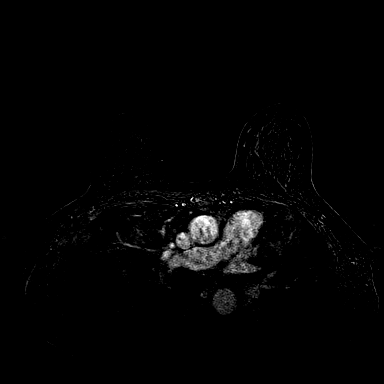
[im 108/144]
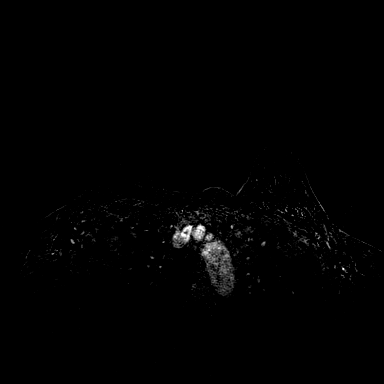
[im 144/144]
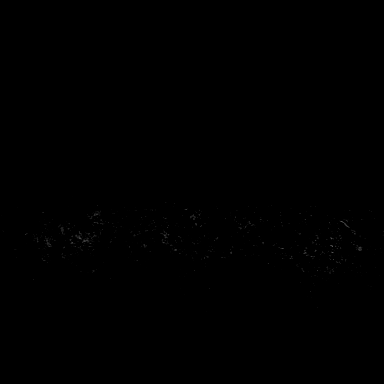

[Series 7: fl3d post-cm 20 · axial · 172.8mm · 0.94mm/px · 1 of 1 slices shown (3 of 3)]
[im 1/1]
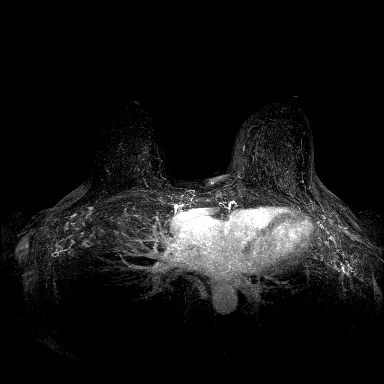

[Series 8: fl3d post-cm 3min · axial · 1.2mm · 0.94mm/px · z∈[-102,+70]mm · 6 of 144 slices shown]
[im 1/144]
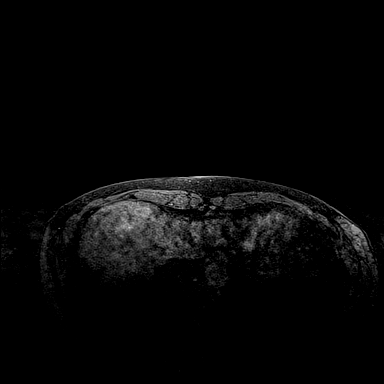
[im 29/144]
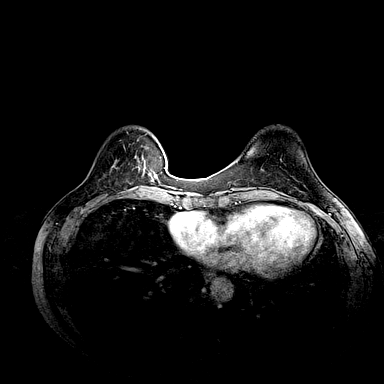
[im 58/144]
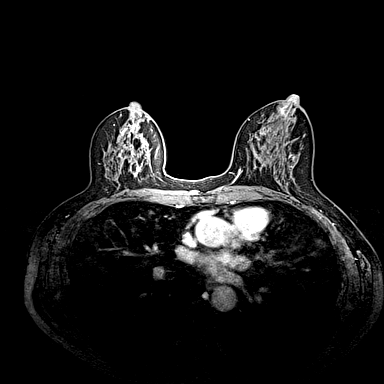
[im 86/144]
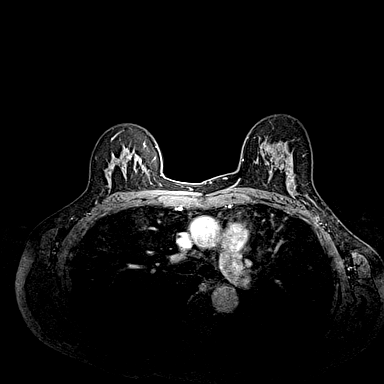
[im 115/144]
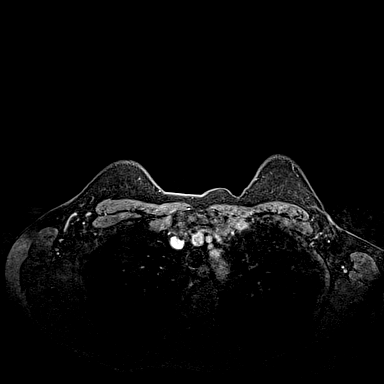
[im 144/144]
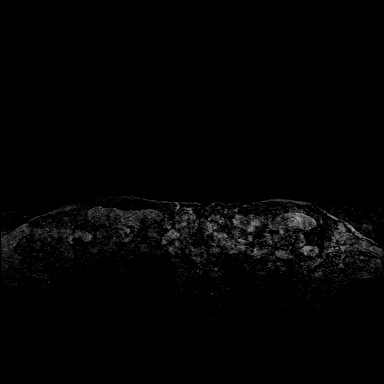

[Series 9: fl3d post-cm 3min_sub · axial · 1.2mm · 0.94mm/px · z∈[-102,+35]mm · 5 of 144 slices shown]
[im 1/144]
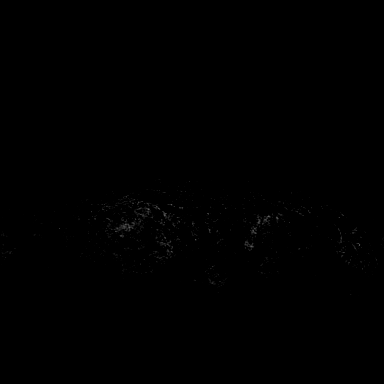
[im 29/144]
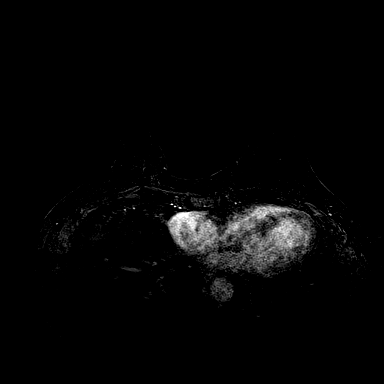
[im 58/144]
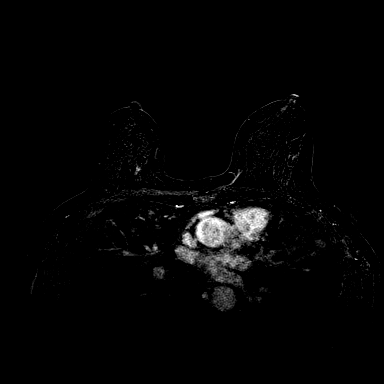
[im 86/144]
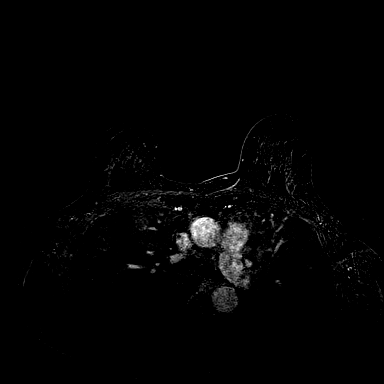
[im 115/144]
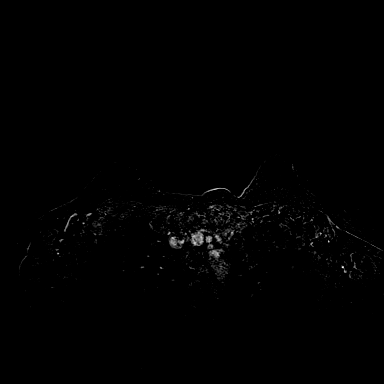

[33 of 48 positions shown; findings below may reference images not displayed]

Three-dimensional MR images were rendered by post-processing of the
original MR data on an independent workstation. The
three-dimensional MR images were interpreted, and findings are
reported in the following complete MRI report for this study. Three
dimensional images were evaluated at the independent [employer]
workstation
FINDINGS: Breast composition: c. Heterogeneous fibroglandular tissue.

Background parenchymal enhancement: Mild.

Right breast: No mass or abnormal enhancement.

Left breast: There has been complete interval resolution of the
patient's known left breast cancer in the lower outer left breast.
Otherwise, no mass or abnormal enhancement.

Lymph nodes: No abnormal appearing lymph nodes. There is been
complete interval resolution of previously described left axillary
lymphadenopathy.

Ancillary findings:  None.
IMPRESSION: Complete imaging resolution of known left breast mass and axillary
lymphadenopathy consistent with an excellent response to
chemotherapy.

RECOMMENDATION:
Per clinical treatment plan.

BI-RADS CATEGORY  6: Known biopsy-proven malignancy.

## 2020-08-17 ENCOUNTER — Encounter (HOSPITAL_COMMUNITY): Payer: Self-pay

## 2020-08-18 NOTE — Progress Notes (Signed)
Patient Care Team: System, Provider Not In as PCP - General Heather Rudd, MD as Consulting Physician (Radiation Oncology) Heather Klein, MD as Consulting Physician (General Surgery) Heather Lose, MD as Consulting Physician (Hematology and Oncology)  DIAGNOSIS:    ICD-10-CM   1. Malignant neoplasm of lower-outer quadrant of left breast of female, estrogen receptor negative ([employer])  C50.512    Z17.1       SUMMARY OF ONCOLOGIC HISTORY: Oncology History  Malignant neoplasm of lower-outer quadrant of left breast of female, estrogen receptor negative ([place])  12/07/2018 Initial Diagnosis   Patient palpated a lump in the left breast. Mammogram showed a 1.4cm mass at the 5 o'clock position in the left breast, a left axillary lymph node measuring 2.8cm, and 2 smaller axillary lymph nodes with cortical thickening. 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   12/14/2018 Cancer Staging   Staging form: Breast, AJCC 8th Edition - Clinical: Stage IIB (cT1c, cN1, cM0, G3, ER-, PR-, HER2-)   12/23/2018 - 03/24/2019 Chemotherapy   DOXOrubicin (ADRIAMYCIN) chemo injection 98 mg, 60 mg/m2 = 98 mg, Intravenous,  Once, 4 of 4 cycles. Dose modification: 50 mg/m2 (original dose 60 mg/m2, Cycle 2, Reason: Dose not tolerated). Administration: 98 mg (12/23/2018), 82 mg (01/06/2019), 82 mg (01/20/2019), 82 mg (02/02/2019)  palonosetron (ALOXI) injection 0.25 mg, 0.25 mg, Intravenous,  Once, 7 of 7 cycles. Administration: 0.25 mg (12/23/2018), 0.25 mg (02/17/2019), 0.25 mg (01/06/2019), 0.25 mg (01/20/2019), 0.25 mg (02/02/2019), 0.25 mg (03/31/2019), 0.25 mg (04/21/2019)  pegfilgrastim-jmdb (FULPHILA) injection 6 mg, 6 mg, Subcutaneous,  Once, 4 of 4 cycles. Administration: 6 mg (12/26/2018), 6 mg (01/09/2019), 6 mg (01/23/2019), 6 mg (02/04/2019)  CARBOplatin (PARAPLATIN) 570 mg in sodium chloride 0.9 % 250 mL chemo infusion, 570 mg (105.4 % of original dose 544.2 mg),  Intravenous,  Once, 1 of 1 cycle. Dose modification:   (original dose 544.2 mg, Cycle 5). Administration: 570 mg (02/17/2019)  cyclophosphamide (CYTOXAN) 980 mg in sodium chloride 0.9 % 250 mL chemo infusion, 600 mg/m2 = 980 mg, Intravenous,  Once, 4 of 4 cycles. Dose modification: 500 mg/m2 (original dose 600 mg/m2, Cycle 2, Reason: Dose not tolerated). Administration: 980 mg (12/23/2018), 820 mg (01/06/2019), 820 mg (01/20/2019), 820 mg (02/02/2019)  PACLitaxel (TAXOL) 132 mg in sodium chloride 0.9 % 250 mL chemo infusion (</= 16m/m2), 80 mg/m2 = 132 mg, Intravenous,  Once, 4 of 4 cycles. Dose modification: 65 mg/m2 (original dose 80 mg/m2, Cycle 5, Reason: Provider Judgment), 50 mg/m2 (original dose 80 mg/m2, Cycle 6, Reason: Provider Judgment), 65 mg/m2 (original dose 80 mg/m2, Cycle 6, Reason: Provider Judgment). Administration: 132 mg (02/17/2019), 108 mg (02/24/2019), 84 mg (03/31/2019), 84 mg (04/07/2019), 108 mg (04/14/2019), 108 mg (04/21/2019), 108 mg (04/28/2019), 108 mg (05/05/2019), 108 mg (05/12/2019), 108 mg (05/19/2019), 108 mg (05/26/2019), 84 mg (03/24/2019)  fosaprepitant (EMEND) 150 mg  dexamethasone (DECADRON) 12 mg in sodium chloride 0.9 % 145 mL IVPB, , Intravenous,  Once, 7 of 7 cycles. Administration:  (12/23/2018),  (02/17/2019),  (01/06/2019),  (01/20/2019),  (02/02/2019),  (03/31/2019),  (04/21/2019).   07/04/2019 Surgery   Left lumpectomy ([facility] Dienes ([phone]: no residual carcinoma and 13 left axillary lymph nodes negative for carcinoma.   08/09/2019 - 09/26/2019 Radiation Therapy   The patient initially received a dose of 50.4 Gy in 28 fractions to the breast and supraclavicular region using whole-breast tangent fields. This was delivered using a 3-D conformal technique. The pt received  a boost delivering an additional 10 Gy in 5 fractions using a electron boost with 74mV electrons. The total dose was 60.4 Gy.     CHIEF COMPLIANT: Follow-up of left breast cancer  INTERVAL HISTORY:  Heather Peggsis a 60y.o. 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 She reports to the clinic today for follow-up.  She denies any lumps or nodules in the breast.  ALLERGIES:  has No Known Allergies.  MEDICATIONS:  Current Outpatient Medications  Medication Sig Dispense Refill   bismuth subsalicylate (PEPTO BISMOL) 262 MG chewable tablet Chew 262-524 mg by mouth as needed for indigestion.     Calcium Carb-Cholecalciferol (CALCIUM 600 + D PO) Take 1 tablet by mouth 2 (two) times daily.     cetirizine (ZYRTEC) 10 MG tablet Take 10 mg by mouth daily.     clotrimazole-betamethasone (LOTRISONE) cream Apply 1 application topically 2 (two) times daily. 30 g 0   desoximetasone (TOPICORT) 0.25 % cream Apply to affected area 60 g 1   fluconazole (DIFLUCAN) 100 MG tablet Take 2 tablets (200 mg total) by mouth daily. (Patient not taking: Reported on 03/13/2020) 2 tablet 5   Hypertonic Nasal Wash (SINUS RINSE KIT NA) Place 1 spray into the nose daily as needed (congestion).     Melatonin 5 MG CAPS Take 5-10 mg by mouth at bedtime as needed (sleep). (Patient not taking: Reported on 03/13/2020)     Multiple Vitamin (MULTIVITAMIN WITH MINERALS) TABS tablet Take 2 tablets by mouth daily.  (Patient not taking: Reported on 03/13/2020)     Olopatadine HCl (PATADAY OP) Place 2 drops into both eyes daily as needed (dry eyes).      Probiotic Product (PROBIOTIC PO) Take 1 tablet by mouth daily.      urea (CARMOL) 40 % CREA Apply 1 application topically daily as needed (dry skin / rash). (Patient not taking: Reported on 03/13/2020)     No current facility-administered medications for this visit.    PHYSICAL EXAMINATION: ECOG PERFORMANCE STATUS: 1 - Symptomatic but completely ambulatory  Vitals:   08/19/20 1401  BP: 133/73  Pulse: 99  Resp: 16  Temp: 97.8 F (36.6 C)  SpO2: 99%   Filed Weights    08/19/20 1401  Weight: 136 lb 6.4 oz (61.9 kg)    BREAST: No palpable masses or nodules in either right or left breasts. No palpable axillary supraclavicular or infraclavicular adenopathy no breast tenderness or nipple discharge. (exam performed in the presence of a chaperone)  LABORATORY DATA:  I have reviewed the data as listed CMP Latest Ref Rng & Units 05/26/2019 05/19/2019 05/12/2019  Glucose 70 - 99 mg/dL 131(H) 93 109(H)  BUN 6 - 20 mg/dL '11 8 9  ' Creatinine 0.44 - 1.00 mg/dL 0.72 0.69 0.67  Sodium 135 - 145 mmol/L 141 133(L) 140  Potassium 3.5 - 5.1 mmol/L 3.7 3.9 3.9  Chloride 98 - 111 mmol/L 104 101 104  CO2 22 - 32 mmol/L '23 25 26  ' Calcium 8.9 - 10.3 mg/dL 9.2 9.3 9.3  Total Protein 6.5 - 8.1 g/dL 6.8 6.7 6.7  Total Bilirubin 0.3 - 1.2 mg/dL <0.2(L) 0.3 0.2(L)  Alkaline Phos 38 - 126 U/L 100 95 100  AST 15 - 41 U/L '27 19 23  ' ALT 0 - 44 U/L '23 14 14    ' Lab Results  Component Value Date   WBC 4.5 06/30/2019   HGB 12.5  06/30/2019   HCT 39.3 06/30/2019   MCV 99.2 06/30/2019   PLT 167 06/30/2019   NEUTROABS 1.8 05/26/2019    ASSESSMENT & PLAN:  Malignant neoplasm of lower-outer quadrant of left breast of female, estrogen receptor negative ([place]) 12/07/2018:Patient palpated a lump in the left breast. Mammogram showed a 1.4cm mass at the 5 o'clock position in the left breast, a left axillary lymph node measuring 2.8cm, and 2 smaller axillary lymph nodes with cortical thickening. 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 Stage IIb   Treatment plan: 1. Neoadjuvant chemotherapy with Adriamycin and Cytoxan dose dense 4 followed by Taxol weekly 12 with carboplatin every 3 weeks--carbo eliminated due to cytopenias completed 05/26/2019 2. Left lumpectomy [facility]): no residual carcinoma and 13 left axillary lymph nodes negative for carcinoma. 3. Followed by adjuvant radiation therapy completed 09/26/19   Breast MRI on 12/21/18 showed the  known 1.5cm mass in the left breast, at least 6 abnormal left axillary lymph nodes, and no right breast malignancy -------------------------------------------------------------------------------------------------------------------------------------------------- 07/04/2019:Left lumpectomy [facility]): no residual carcinoma and 13 left axillary lymph nodes negative for carcinoma.  Treatment plan: surveillance She currently lives in [facility] and that comes here annually for her exams and checkups. She would like to continue with the same.  Breast Cancer Surveillance: 1. Breast Exam: 08/19/20: Benign 2. Mammogram: 05/20/20: Benign Density Cat C  RTC in 1 year    No orders of the defined types were placed in this encounter.  The patient has a good understanding of the overall plan. she agrees with it. she will call with any problems that may develop before the next visit here.  Total time spent: 20 mins including face to face time and time spent for planning, charting and coordination of care  Rulon Eisenmenger, MD, MPH 08/19/2020  I, Thana Ates, am acting as scribe for Dr. Nicholas Hebert.  I have reviewed the above documentation for accuracy and completeness, and I agree with the above.

## 2020-08-19 ENCOUNTER — Inpatient Hospital Stay: Payer: Commercial Managed Care - PPO | Attending: Hematology and Oncology | Admitting: Hematology and Oncology

## 2020-08-19 ENCOUNTER — Other Ambulatory Visit: Payer: Self-pay

## 2020-08-19 DIAGNOSIS — Z171 Estrogen receptor negative status [ER-]: Secondary | ICD-10-CM | POA: Diagnosis not present

## 2020-08-19 DIAGNOSIS — C50512 Malignant neoplasm of lower-outer quadrant of left female breast: Secondary | ICD-10-CM

## 2020-08-19 DIAGNOSIS — Z9221 Personal history of antineoplastic chemotherapy: Secondary | ICD-10-CM | POA: Insufficient documentation

## 2020-08-19 DIAGNOSIS — Z923 Personal history of irradiation: Secondary | ICD-10-CM | POA: Insufficient documentation

## 2020-08-19 DIAGNOSIS — Z853 Personal history of malignant neoplasm of breast: Secondary | ICD-10-CM | POA: Diagnosis present

## 2020-08-19 MED ORDER — ROSUVASTATIN CALCIUM 10 MG PO TABS: 10.0000 mg | ORAL_TABLET | Freq: Every day | ORAL | Status: AC

## 2020-08-19 NOTE — Assessment & Plan Note (Signed)
12/07/2018:Patient palpated a lump in the left breast. Mammogram showed a 1.4cm mass at the 5 o'clock position in the left breast, a left axillary lymph node measuring 2.8cm, and 2 smaller axillary lymph nodes with cortical thickening. 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 Neoadjuvant chemotherapy with Adriamycin and Cytoxan dose dense 4 followed byTaxolweekly 12with carboplatin every 3 weeks--carbo eliminated due to cytopenias completed 05/26/2019 2. Left lumpectomy [facility]): no residual carcinoma and 13 left axillary lymph nodes negative for carcinoma. 3. Followed by adjuvant radiation therapy completed 09/26/19  Breast MRI on 12/21/18 showed the known 1.5cm mass in the left breast, at least 6 abnormal left axillary lymph nodes, and no right breast malignancy -------------------------------------------------------------------------------------------------------------------------------------------------- 07/04/2019:Left lumpectomy [facility]): no residual carcinoma and 13 left axillary lymph nodes negative for carcinoma.  Treatment plan: surveillance She is building a house in [place].  She is excited about that.  Breast Cancer Surveillance: 1. Breast Exam: 08/19/20: Benign 2. Mammogram: 05/20/20: Benign Density Cat C  RTC in 1 year

## 2020-08-23 ENCOUNTER — Other Ambulatory Visit: Payer: Self-pay | Admitting: Physician Assistant

## 2021-05-08 ENCOUNTER — Other Ambulatory Visit: Payer: Self-pay | Admitting: Hematology and Oncology

## 2021-05-08 DIAGNOSIS — Z9889 Other specified postprocedural states: Secondary | ICD-10-CM

## 2021-06-06 ENCOUNTER — Ambulatory Visit
Admission: RE | Admit: 2021-06-06 | Discharge: 2021-06-06 | Disposition: A | Discharge status: Home / Self Care | Payer: Commercial Managed Care - PPO | Source: Ambulatory Visit | Attending: Hematology and Oncology | Admitting: Hematology and Oncology

## 2021-06-06 DIAGNOSIS — Z9889 Other specified postprocedural states: Secondary | ICD-10-CM

## 2021-07-21 ENCOUNTER — Telehealth: Payer: Self-pay | Admitting: Hematology and Oncology

## 2021-07-21 NOTE — Telephone Encounter (Signed)
Rescheduled appointment per provider. Left message. 

## 2021-07-22 ENCOUNTER — Telehealth: Payer: Self-pay | Admitting: Hematology and Oncology

## 2021-07-22 NOTE — Telephone Encounter (Signed)
Called pt to updated appts, msg left

## 2021-07-23 ENCOUNTER — Telehealth: Payer: Self-pay | Admitting: Hematology and Oncology

## 2021-07-23 NOTE — Telephone Encounter (Signed)
Called pt to r/s per schedule order error, pt aware

## 2021-08-22 ENCOUNTER — Ambulatory Visit: Payer: Commercial Managed Care - PPO | Admitting: Hematology and Oncology

## 2021-09-02 ENCOUNTER — Ambulatory Visit: Payer: Commercial Managed Care - PPO | Admitting: Hematology and Oncology

## 2021-09-05 NOTE — Progress Notes (Signed)
Patient Care Team: System, Provider Not In as PCP - General Kyung Rudd, MD as Consulting Physician (Radiation Oncology) [name], MD as Consulting Physician (General Surgery) Nicholas Lose, MD as Consulting Physician (Hematology and Oncology)  DIAGNOSIS: No diagnosis found.  SUMMARY OF ONCOLOGIC HISTORY: Oncology History  Malignant neoplasm of lower-outer quadrant of left breast of female, estrogen receptor negative ([place])  12/07/2018 Initial Diagnosis   Patient palpated a lump in the left breast. Mammogram showed a 1.4cm mass at the 5 o'clock position in the left breast, a left axillary lymph node measuring 2.8cm, and 2 smaller axillary lymph nodes with cortical thickening. 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   12/14/2018 Cancer Staging   Staging form: Breast, AJCC 8th Edition - Clinical: Stage IIB (cT1c, cN1, cM0, G3, ER-, PR-, HER2-)   12/23/2018 - 03/24/2019 Chemotherapy   DOXOrubicin (ADRIAMYCIN) chemo injection 98 mg, 60 mg/m2 = 98 mg, Intravenous,  Once, 4 of 4 cycles. Dose modification: 50 mg/m2 (original dose 60 mg/m2, Cycle 2, Reason: Dose not tolerated). Administration: 98 mg (12/23/2018), 82 mg (01/06/2019), 82 mg (01/20/2019), 82 mg (02/02/2019)  palonosetron (ALOXI) injection 0.25 mg, 0.25 mg, Intravenous,  Once, 7 of 7 cycles. Administration: 0.25 mg (12/23/2018), 0.25 mg (02/17/2019), 0.25 mg (01/06/2019), 0.25 mg (01/20/2019), 0.25 mg (02/02/2019), 0.25 mg (03/31/2019), 0.25 mg (04/21/2019)  pegfilgrastim-jmdb (FULPHILA) injection 6 mg, 6 mg, Subcutaneous,  Once, 4 of 4 cycles. Administration: 6 mg (12/26/2018), 6 mg (01/09/2019), 6 mg (01/23/2019), 6 mg (02/04/2019)  CARBOplatin (PARAPLATIN) 570 mg in sodium chloride 0.9 % 250 mL chemo infusion, 570 mg (105.4 % of original dose 544.2 mg), Intravenous,  Once, 1 of 1 cycle. Dose modification:   (original dose 544.2 mg, Cycle 5). Administration: 570 mg  (02/17/2019)  cyclophosphamide (CYTOXAN) 980 mg in sodium chloride 0.9 % 250 mL chemo infusion, 600 mg/m2 = 980 mg, Intravenous,  Once, 4 of 4 cycles. Dose modification: 500 mg/m2 (original dose 600 mg/m2, Cycle 2, Reason: Dose not tolerated). Administration: 980 mg (12/23/2018), 820 mg (01/06/2019), 820 mg (01/20/2019), 820 mg (02/02/2019)  PACLitaxel (TAXOL) 132 mg in sodium chloride 0.9 % 250 mL chemo infusion (</= 95m/m2), 80 mg/m2 = 132 mg, Intravenous,  Once, 4 of 4 cycles. Dose modification: 65 mg/m2 (original dose 80 mg/m2, Cycle 5, Reason: Provider Judgment), 50 mg/m2 (original dose 80 mg/m2, Cycle 6, Reason: Provider Judgment), 65 mg/m2 (original dose 80 mg/m2, Cycle 6, Reason: Provider Judgment). Administration: 132 mg (02/17/2019), 108 mg (02/24/2019), 84 mg (03/31/2019), 84 mg (04/07/2019), 108 mg (04/14/2019), 108 mg (04/21/2019), 108 mg (04/28/2019), 108 mg (05/05/2019), 108 mg (05/12/2019), 108 mg (05/19/2019), 108 mg (05/26/2019), 84 mg (03/24/2019)  fosaprepitant (EMEND) 150 mg  dexamethasone (DECADRON) 12 mg in sodium chloride 0.9 % 145 mL IVPB, , Intravenous,  Once, 7 of 7 cycles. Administration:  (12/23/2018),  (02/17/2019),  (01/06/2019),  (01/20/2019),  (02/02/2019),  (03/31/2019),  (04/21/2019).   07/04/2019 Surgery   Left lumpectomy (Barry Dienes ([phone]: no residual carcinoma and 13 left axillary lymph nodes negative for carcinoma.   08/09/2019 - 09/26/2019 Radiation Therapy   The patient initially received a dose of 50.4 Gy in 28 fractions to the breast and supraclavicular region using whole-breast tangent fields. This was delivered using a 3-D conformal technique. The pt received a boost delivering an additional 10 Gy in 5 fractions using a electron boost with 128m electrons. The total dose was 60.4 Gy.     CHIEF COMPLIANT:  Follow-up of left breast cancer  INTERVAL HISTORY: Heather Hebert is a 61 y.o. 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 She reports to the clinic today for follow-up.     ALLERGIES:  has No Known Allergies.  MEDICATIONS:  Current Outpatient Medications  Medication Sig Dispense Refill   bismuth subsalicylate (PEPTO BISMOL) 262 MG chewable tablet Chew 262-524 mg by mouth as needed for indigestion.     Calcium Carb-Cholecalciferol (CALCIUM 600 + D PO) Take 1 tablet by mouth 2 (two) times daily.     cetirizine (ZYRTEC) 10 MG tablet Take 10 mg by mouth daily.     fluconazole (DIFLUCAN) 100 MG tablet Take 2 tablets (200 mg total) by mouth daily. (Patient not taking: Reported on 03/13/2020) 2 tablet 5   Hypertonic Nasal Wash (SINUS RINSE KIT NA) Place 1 spray into the nose daily as needed (congestion).     Olopatadine HCl (PATADAY OP) Place 2 drops into both eyes daily as needed (dry eyes).      Probiotic Product (PROBIOTIC PO) Take 1 tablet by mouth daily.      rosuvastatin (CRESTOR) 10 MG tablet Take 1 tablet (10 mg total) by mouth daily.     No current facility-administered medications for this visit.    PHYSICAL EXAMINATION: ECOG PERFORMANCE STATUS: {CHL ONC ECOG PS:[phone]}  There were no vitals filed for this visit. There were no vitals filed for this visit.  BREAST:*** No palpable masses or nodules in either right or left breasts. No palpable axillary supraclavicular or infraclavicular adenopathy no breast tenderness or nipple discharge. (exam performed in the presence of a chaperone)  LABORATORY DATA:  I have reviewed the data as listed    Latest Ref Rng & Units 05/26/2019   10:00 AM 05/19/2019   10:05 AM 05/12/2019   12:45 PM  CMP  Glucose 70 - 99 mg/dL 131  93  109   BUN 6 - 20 mg/dL _0 Creatinine 0.44 - 1.00 mg/dL 0.72  0.69  0.67   Sodium 135 - 145 mmol/L 141  133  140   Potassium 3.5 - 5.1 mmol/L 3.7  3.9  3.9   Chloride 98 - 111 mmol/L 104  101  104   CO2 22 - 32 mmol/L _1 Calcium 8.9 - 10.3  mg/dL 9.2  9.3  9.3   Total Protein 6.5 - 8.1 g/dL 6.8  6.7  6.7   Total Bilirubin 0.3 - 1.2 mg/dL <0.2  0.3  0.2   Alkaline Phos 38 - 126 U/L 100  95  100   AST 15 - 41 U/L _2 ALT 0 - 44 U/L _3 Lab Results  Component Value Date   WBC 4.5 06/30/2019   HGB 12.5 06/30/2019   HCT 39.3 06/30/2019   MCV 99.2 06/30/2019   PLT 167 06/30/2019   NEUTROABS 1.8 05/26/2019    ASSESSMENT & PLAN:  No problem-specific Assessment & Plan notes found for this encounter.    No orders of the defined types were placed in this encounter.  The patient has a good understanding of the overall plan. she agrees with it. she will call with any problems that may develop before the next visit here. Total time spent: 30 mins including face to face time and time spent  for planning, charting and co-ordination of care   Suzzette Righter, [place] 09/05/21  I Gardiner Coins am scribing for Dr. Lindi Adie  ***

## 2021-09-11 ENCOUNTER — Inpatient Hospital Stay: Payer: Commercial Managed Care - PPO | Attending: Hematology and Oncology | Admitting: Hematology and Oncology

## 2021-09-11 ENCOUNTER — Other Ambulatory Visit: Payer: Self-pay

## 2021-09-11 DIAGNOSIS — C50512 Malignant neoplasm of lower-outer quadrant of left female breast: Secondary | ICD-10-CM | POA: Insufficient documentation

## 2021-09-11 DIAGNOSIS — Z171 Estrogen receptor negative status [ER-]: Secondary | ICD-10-CM | POA: Insufficient documentation

## 2021-09-11 MED ORDER — LEVOTHYROXINE SODIUM 25 MCG PO TABS: 25.0000 ug | ORAL_TABLET | Freq: Every day | ORAL | Status: DC

## 2021-09-11 NOTE — Assessment & Plan Note (Signed)
12/07/2018:Patient palpated a lump in the left breast. Mammogram showed a 1.4cm mass at the 5 o'clock position in the left breast, a left axillary lymph node measuring 2.8cm, and 2 smaller axillary lymph nodes with cortical thickening. 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 Neoadjuvant chemotherapy with Adriamycin and Cytoxan dose dense 4 followed byTaxolweekly 12with carboplatin every 3 weeks--carbo eliminated due to cytopenias completed 05/26/2019 2.Left lumpectomy [facility]): no residual carcinoma and 13 left axillary lymph nodes negative for carcinoma. 3. Followed by adjuvant radiation therapy completed 09/26/19  Breast MRI on 12/21/18 showed the known 1.5cm mass in the left breast, at least 6 abnormal left axillary lymph nodes, and no right breast malignancy -------------------------------------------------------------------------------------------------------------------------------------------------- 07/04/2019:Left lumpectomy [facility]): no residual carcinoma and 13 left axillary lymph nodes negative for carcinoma.  Treatment plan: surveillance She currently lives in [facility] and that comes here annually for her exams and checkups. She would like to continue with the same.  Breast Cancer Surveillance: 1. Breast Exam: 09/11/2021: Benign 2. Mammogram: 05/29/2021: Benign Density Cat C  RTC in 1 year

## 2022-05-21 ENCOUNTER — Other Ambulatory Visit: Payer: Self-pay | Admitting: Hematology and Oncology

## 2022-05-21 DIAGNOSIS — Z1231 Encounter for screening mammogram for malignant neoplasm of breast: Secondary | ICD-10-CM

## 2022-07-17 ENCOUNTER — Ambulatory Visit: Payer: Commercial Managed Care - PPO

## 2022-08-17 ENCOUNTER — Ambulatory Visit: Payer: Commercial Managed Care - PPO | Admitting: Hematology and Oncology

## 2022-08-21 ENCOUNTER — Ambulatory Visit
Admission: RE | Admit: 2022-08-21 | Discharge: 2022-08-21 | Disposition: A | Discharge status: Home / Self Care | Payer: Commercial Managed Care - PPO | Source: Ambulatory Visit | Attending: Hematology and Oncology | Admitting: Hematology and Oncology

## 2022-08-21 DIAGNOSIS — Z1231 Encounter for screening mammogram for malignant neoplasm of breast: Secondary | ICD-10-CM

## 2022-09-14 ENCOUNTER — Other Ambulatory Visit: Payer: Self-pay

## 2022-09-14 ENCOUNTER — Inpatient Hospital Stay: Payer: Commercial Managed Care - PPO | Attending: Hematology and Oncology | Admitting: Hematology and Oncology

## 2022-09-14 VITALS — BP 124/68 | HR 78 | Temp 97.3°F | Resp 18 | Ht 62.0 in | Wt 134.1 lb

## 2022-09-14 DIAGNOSIS — C50512 Malignant neoplasm of lower-outer quadrant of left female breast: Secondary | ICD-10-CM | POA: Diagnosis not present

## 2022-09-14 DIAGNOSIS — Z9221 Personal history of antineoplastic chemotherapy: Secondary | ICD-10-CM | POA: Insufficient documentation

## 2022-09-14 DIAGNOSIS — Z171 Estrogen receptor negative status [ER-]: Secondary | ICD-10-CM

## 2022-09-14 DIAGNOSIS — Z923 Personal history of irradiation: Secondary | ICD-10-CM | POA: Insufficient documentation

## 2022-09-14 DIAGNOSIS — Z853 Personal history of malignant neoplasm of breast: Secondary | ICD-10-CM | POA: Diagnosis not present

## 2022-09-14 NOTE — Progress Notes (Unsigned)
Patient Care Team: Dorothy Puffer, MD as Consulting Physician (Radiation Oncology) Almond Lint, MD as Consulting Physician (General Surgery) Serena Croissant, MD as Consulting Physician (Hematology and Oncology) Mardi Mainland (Adult Health Nurse Practitioner)  DIAGNOSIS:  Encounter Diagnosis  Name Primary?   Malignant neoplasm of lower-outer quadrant of left breast of female, estrogen receptor negative (HCC) Yes    SUMMARY OF ONCOLOGIC HISTORY: Oncology History  Malignant neoplasm of lower-outer quadrant of left breast of female, estrogen receptor negative (HCC)  12/07/2018 Initial Diagnosis   Patient palpated a lump in the left breast. Mammogram showed a 1.4cm mass at the 5 o'clock position in the left breast, a left axillary lymph node measuring 2.8cm, and 2 smaller axillary lymph nodes with cortical thickening. 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   12/14/2018 Cancer Staging   Staging form: Breast, AJCC 8th Edition - Clinical: Stage IIB (cT1c, cN1, cM0, G3, ER-, PR-, HER2-)   12/23/2018 - 03/24/2019 Chemotherapy   DOXOrubicin (ADRIAMYCIN) chemo injection 98 mg, 60 mg/m2 = 98 mg, Intravenous,  Once, 4 of 4 cycles. Dose modification: 50 mg/m2 (original dose 60 mg/m2, Cycle 2, Reason: Dose not tolerated). Administration: 98 mg (12/23/2018), 82 mg (01/06/2019), 82 mg (01/20/2019), 82 mg (02/02/2019)  palonosetron (ALOXI) injection 0.25 mg, 0.25 mg, Intravenous,  Once, 7 of 7 cycles. Administration: 0.25 mg (12/23/2018), 0.25 mg (02/17/2019), 0.25 mg (01/06/2019), 0.25 mg (01/20/2019), 0.25 mg (02/02/2019), 0.25 mg (03/31/2019), 0.25 mg (04/21/2019)  pegfilgrastim-jmdb (FULPHILA) injection 6 mg, 6 mg, Subcutaneous,  Once, 4 of 4 cycles. Administration: 6 mg (12/26/2018), 6 mg (01/09/2019), 6 mg (01/23/2019), 6 mg (02/04/2019)  CARBOplatin (PARAPLATIN) 570 mg in sodium chloride 0.9 % 250 mL chemo infusion, 570 mg (105.4 % of original dose  544.2 mg), Intravenous,  Once, 1 of 1 cycle. Dose modification:   (original dose 544.2 mg, Cycle 5). Administration: 570 mg (02/17/2019)  cyclophosphamide (CYTOXAN) 980 mg in sodium chloride 0.9 % 250 mL chemo infusion, 600 mg/m2 = 980 mg, Intravenous,  Once, 4 of 4 cycles. Dose modification: 500 mg/m2 (original dose 600 mg/m2, Cycle 2, Reason: Dose not tolerated). Administration: 980 mg (12/23/2018), 820 mg (01/06/2019), 820 mg (01/20/2019), 820 mg (02/02/2019)  PACLitaxel (TAXOL) 132 mg in sodium chloride 0.9 % 250 mL chemo infusion (</= 80mg /m2), 80 mg/m2 = 132 mg, Intravenous,  Once, 4 of 4 cycles. Dose modification: 65 mg/m2 (original dose 80 mg/m2, Cycle 5, Reason: Provider Judgment), 50 mg/m2 (original dose 80 mg/m2, Cycle 6, Reason: Provider Judgment), 65 mg/m2 (original dose 80 mg/m2, Cycle 6, Reason: Provider Judgment). Administration: 132 mg (02/17/2019), 108 mg (02/24/2019), 84 mg (03/31/2019), 84 mg (04/07/2019), 108 mg (04/14/2019), 108 mg (04/21/2019), 108 mg (04/28/2019), 108 mg (05/05/2019), 108 mg (05/12/2019), 108 mg (05/19/2019), 108 mg (05/26/2019), 84 mg (03/24/2019)  fosaprepitant (EMEND) 150 mg  dexamethasone (DECADRON) 12 mg in sodium chloride 0.9 % 145 mL IVPB, , Intravenous,  Once, 7 of 7 cycles. Administration:  (12/23/2018),  (02/17/2019),  (01/06/2019),  (01/20/2019),  (02/02/2019),  (03/31/2019),  (04/21/2019).   07/04/2019 Surgery   Left lumpectomy Donell Beers) [phone]): no residual carcinoma and 13 left axillary lymph nodes negative for carcinoma.   08/09/2019 - 09/26/2019 Radiation Therapy   The patient initially received a dose of 50.4 Gy in 28 fractions to the breast and supraclavicular region using whole-breast tangent fields. This was delivered using a 3-D conformal technique. The pt received a boost delivering an additional 10 Gy in 5  fractions using a electron boost with electrons. The total dose was 60.4 Gy.     CHIEF COMPLIANT: Surveillance  INTERVAL HISTORY: Heather Hebert is a 62 y.o. 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 She reports to the clinic today for follow-up. Patient reports no new symptoms. Had some concern about her nails.   ALLERGIES:  is allergic to gramineae pollens.  MEDICATIONS:  Current Outpatient Medications  Medication Sig Dispense Refill   Calcium Carb-Cholecalciferol (CALCIUM 600 + D PO) Take 1 tablet by mouth daily.     cetirizine (ZYRTEC) 10 MG tablet Take 10 mg by mouth daily.     levothyroxine (SYNTHROID) 25 MCG tablet Take 1 tablet (25 mcg total) by mouth daily before breakfast.     Olopatadine HCl (PATADAY OP) Place 2 drops into both eyes daily as needed (dry eyes).      Probiotic Product (PROBIOTIC PO) Take 1 tablet by mouth daily.      rosuvastatin (CRESTOR) 10 MG tablet Take 1 tablet (10 mg total) by mouth daily.     No current facility-administered medications for this visit.    PHYSICAL EXAMINATION: ECOG PERFORMANCE STATUS: 0 - Asymptomatic  Vitals:   09/14/22 1434  BP: 124/68  Pulse: 78  Resp: 18  Temp: (!) 97.3 F (36.3 C)  SpO2: 100%   Filed Weights   09/14/22 1434  Weight: 134 lb 1.6 oz (60.8 kg)    BREAST: No palpable masses or nodules in either right or left breasts. No palpable axillary supraclavicular or infraclavicular adenopathy no breast tenderness or nipple discharge. (exam performed in the presence of a chaperone)  LABORATORY DATA:  I have reviewed the data as listed    Latest Ref Rng & Units 05/26/2019   10:00 AM 05/19/2019   10:05 AM 05/12/2019   12:45 PM  CMP  Glucose 70 - 99 mg/dL 952  93  841   BUN 6 - 20 mg/dL 11  8  9    Creatinine 0.44 - 1.00 mg/dL 3.24  4.01  0.27   Sodium 135 - 145 mmol/L 141  133  140   Potassium 3.5 - 5.1 mmol/L 3.7  3.9  3.9   Chloride 98 - 111 mmol/L 104  101  104   CO2 22 - 32 mmol/L 23  25  26    Calcium 8.9 - 10.3 mg/dL 9.2  9.3  9.3    Total Protein 6.5 - 8.1 g/dL 6.8  6.7  6.7   Total Bilirubin 0.3 - 1.2 mg/dL <2.5  0.3  0.2   Alkaline Phos 38 - 126 U/L 100  95  100   AST 15 - 41 U/L 27  19  23    ALT 0 - 44 U/L 23  14  14      Lab Results  Component Value Date   WBC 4.5 06/30/2019   HGB 12.5 06/30/2019   HCT 39.3 06/30/2019   MCV 99.2 06/30/2019   PLT 167 06/30/2019   NEUTROABS 1.8 05/26/2019    ASSESSMENT & PLAN:  Malignant neoplasm of lower-outer quadrant of left breast of female, estrogen receptor negative (HCC) 12/07/2018:Patient palpated a lump in the left breast. Mammogram showed a 1.4cm mass at the 5 o'clock position in the left breast, a left axillary lymph node measuring 2.8cm, and 2 smaller axillary lymph nodes with cortical thickening. Biopsy showed IDC, grade 3, HER-2 - (0), ER/PR -, Ki67 80% in  the breast and lymph node. Stage IIb   Treatment plan: 1. Neoadjuvant chemotherapy with Adriamycin and Cytoxan dose dense 4 followed by Taxol weekly 12 with carboplatin every 3 weeks--carbo eliminated due to cytopenias completed 05/26/2019 2. Left lumpectomy [facility]): no residual carcinoma and 13 left axillary lymph nodes negative for carcinoma. 3. Followed by adjuvant radiation therapy completed 09/26/19   Breast MRI on 12/21/18 showed the known 1.5cm mass in the left breast, at least 6 abnormal left axillary lymph nodes, and no right breast malignancy -------------------------------------------------------------------------------------------------------------------------------------------------- 07/04/2019:Left lumpectomy [facility]): no residual carcinoma and 13 left axillary lymph nodes negative for carcinoma.   Treatment plan: surveillance She has moved to [facility].  She still notes come to [facility] for her checkups. She would like to continue with the same.   Breast Cancer Surveillance: 1. Breast Exam: 09/14/2022: Benign 2. Mammogram: 08/24/2022: Benign Density Cat C   She is going on a  train ride in [place] complaint by her sister for 2 weeks.    RTC in 1 year     No orders of the defined types were placed in this encounter.  The patient has a good understanding of the overall plan. she agrees with it. she will call with any problems that may develop before the next visit here. Total time spent: 30 mins including face to face time and time spent for planning, charting and co-ordination of care   Tamsen Meek, MD 09/15/22    I Janan Ridge am acting as a Neurosurgeon for [employer]  I have reviewed the above documentation for accuracy and completeness, and I agree with the above.

## 2022-09-14 NOTE — Assessment & Plan Note (Signed)
12/07/2018:Patient palpated a lump in the left breast. Mammogram showed a 1.4cm mass at the 5 o'clock position in the left breast, a left axillary lymph node measuring 2.8cm, and 2 smaller axillary lymph nodes with cortical thickening. 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 Stage IIb   Treatment plan: 1. Neoadjuvant chemotherapy with Adriamycin and Cytoxan dose dense 4 followed by Taxol weekly 12 with carboplatin every 3 weeks--carbo eliminated due to cytopenias completed 05/26/2019 2. Left lumpectomy [facility]): no residual carcinoma and 13 left axillary lymph nodes negative for carcinoma. 3. Followed by adjuvant radiation therapy completed 09/26/19   Breast MRI on 12/21/18 showed the known 1.5cm mass in the left breast, at least 6 abnormal left axillary lymph nodes, and no right breast malignancy -------------------------------------------------------------------------------------------------------------------------------------------------- 07/04/2019:Left lumpectomy [facility]): no residual carcinoma and 13 left axillary lymph nodes negative for carcinoma.   Treatment plan: surveillance She currently lives in [facility] and that comes here annually for her exams and checkups. She would like to continue with the same.   Breast Cancer Surveillance: 1. Breast Exam: 09/14/2022: Benign 2. Mammogram: 08/24/2022: Benign Density Cat C   She went on a 500 mile tandem bike ride with her husband in [place].     RTC in 1 year

## 2022-09-18 ENCOUNTER — Telehealth: Payer: Self-pay | Admitting: Hematology and Oncology

## 2023-07-27 ENCOUNTER — Other Ambulatory Visit: Payer: Self-pay | Admitting: Hematology and Oncology

## 2023-07-27 DIAGNOSIS — Z1231 Encounter for screening mammogram for malignant neoplasm of breast: Secondary | ICD-10-CM

## 2023-08-27 ENCOUNTER — Ambulatory Visit
Admission: RE | Admit: 2023-08-27 | Discharge: 2023-08-27 | Disposition: A | Discharge status: Home / Self Care | Source: Ambulatory Visit | Attending: Hematology and Oncology | Admitting: Hematology and Oncology

## 2023-08-27 DIAGNOSIS — Z1231 Encounter for screening mammogram for malignant neoplasm of breast: Secondary | ICD-10-CM

## 2023-09-13 ENCOUNTER — Inpatient Hospital Stay: Payer: Commercial Managed Care - PPO | Attending: Hematology and Oncology | Admitting: Hematology and Oncology

## 2023-09-13 VITALS — BP 139/70 | HR 79 | Temp 98.0°F | Resp 18 | Ht 62.0 in | Wt 133.8 lb

## 2023-09-13 DIAGNOSIS — Z171 Estrogen receptor negative status [ER-]: Secondary | ICD-10-CM | POA: Diagnosis not present

## 2023-09-13 DIAGNOSIS — Z923 Personal history of irradiation: Secondary | ICD-10-CM | POA: Insufficient documentation

## 2023-09-13 DIAGNOSIS — Z9221 Personal history of antineoplastic chemotherapy: Secondary | ICD-10-CM | POA: Insufficient documentation

## 2023-09-13 DIAGNOSIS — Z853 Personal history of malignant neoplasm of breast: Secondary | ICD-10-CM | POA: Diagnosis not present

## 2023-09-13 DIAGNOSIS — C50512 Malignant neoplasm of lower-outer quadrant of left female breast: Secondary | ICD-10-CM | POA: Diagnosis not present

## 2023-09-13 DIAGNOSIS — Z08 Encounter for follow-up examination after completed treatment for malignant neoplasm: Secondary | ICD-10-CM | POA: Diagnosis present

## 2023-09-13 NOTE — Progress Notes (Signed)
 Patient Care Team: Default, Provider, MD as PCP - General Dewey Rush, MD as Consulting Physician (Radiation Oncology) Aron Shoulders, MD as Consulting Physician (General Surgery) Odean Potts, MD as Consulting Physician (Hematology and Oncology) powell meissner (Adult Health Nurse Practitioner)  DIAGNOSIS:  Encounter Diagnosis  Name Primary?   Malignant neoplasm of lower-outer quadrant of left breast of female, estrogen receptor negative (HCC) Yes    SUMMARY OF ONCOLOGIC HISTORY: Oncology History  Malignant neoplasm of lower-outer quadrant of left breast of female, estrogen receptor negative (HCC)  12/07/2018 Initial Diagnosis   Patient palpated a lump in the left breast. Mammogram showed a 1.4cm mass at the 5 o'clock position in the left breast, a left axillary lymph node measuring 2.8cm, and 2 smaller axillary lymph nodes with cortical thickening. 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   12/14/2018 Cancer Staging   Staging form: Breast, AJCC 8th Edition - Clinical: Stage IIB (cT1c, cN1, cM0, G3, ER-, PR-, HER2-)   12/23/2018 - 03/24/2019 Chemotherapy   DOXOrubicin  (ADRIAMYCIN ) chemo injection 98 mg, 60 mg/m2 = 98 mg, Intravenous,  Once, 4 of 4 cycles. Dose modification: 50 mg/m2 (original dose 60 mg/m2, Cycle 2, Reason: Dose not tolerated). Administration: 98 mg (12/23/2018), 82 mg (01/06/2019), 82 mg (01/20/2019), 82 mg (02/02/2019)  palonosetron  (ALOXI ) injection 0.25 mg, 0.25 mg, Intravenous,  Once, 7 of 7 cycles. Administration: 0.25 mg (12/23/2018), 0.25 mg (02/17/2019), 0.25 mg (01/06/2019), 0.25 mg (01/20/2019), 0.25 mg (02/02/2019), 0.25 mg (03/31/2019), 0.25 mg (04/21/2019)  pegfilgrastim -jmdb (FULPHILA ) injection 6 mg, 6 mg, Subcutaneous,  Once, 4 of 4 cycles. Administration: 6 mg (12/26/2018), 6 mg (01/09/2019), 6 mg (01/23/2019), 6 mg (02/04/2019)  CARBOplatin  (PARAPLATIN ) 570 mg in sodium chloride  0.9 % 250 mL chemo  infusion, 570 mg (105.4 % of original dose 544.2 mg), Intravenous,  Once, 1 of 1 cycle. Dose modification:   (original dose 544.2 mg, Cycle 5). Administration: 570 mg (02/17/2019)  cyclophosphamide  (CYTOXAN ) 980 mg in sodium chloride  0.9 % 250 mL chemo infusion, 600 mg/m2 = 980 mg, Intravenous,  Once, 4 of 4 cycles. Dose modification: 500 mg/m2 (original dose 600 mg/m2, Cycle 2, Reason: Dose not tolerated). Administration: 980 mg (12/23/2018), 820 mg (01/06/2019), 820 mg (01/20/2019), 820 mg (02/02/2019)  PACLitaxel  (TAXOL ) 132 mg in sodium chloride  0.9 % 250 mL chemo infusion (</= 80mg /m2), 80 mg/m2 = 132 mg, Intravenous,  Once, 4 of 4 cycles. Dose modification: 65 mg/m2 (original dose 80 mg/m2, Cycle 5, Reason: Provider Judgment), 50 mg/m2 (original dose 80 mg/m2, Cycle 6, Reason: Provider Judgment), 65 mg/m2 (original dose 80 mg/m2, Cycle 6, Reason: Provider Judgment). Administration: 132 mg (02/17/2019), 108 mg (02/24/2019), 84 mg (03/31/2019), 84 mg (04/07/2019), 108 mg (04/14/2019), 108 mg (04/21/2019), 108 mg (04/28/2019), 108 mg (05/05/2019), 108 mg (05/12/2019), 108 mg (05/19/2019), 108 mg (05/26/2019), 84 mg (03/24/2019)  fosaprepitant  (EMEND) 150 mg  dexamethasone  (DECADRON ) 12 mg in sodium chloride  0.9 % 145 mL IVPB, , Intravenous,  Once, 7 of 7 cycles. Administration:  (12/23/2018),  (02/17/2019),  (01/06/2019),  (01/20/2019),  (02/02/2019),  (03/31/2019),  (04/21/2019).   07/04/2019 Surgery   Left lumpectomy [facility]) [phone]): no residual carcinoma and 13 left axillary lymph nodes negative for carcinoma.   08/09/2019 - 09/26/2019 Radiation Therapy   The patient initially received a dose of 50.4 Gy in 28 fractions to the breast and supraclavicular region using whole-breast tangent fields. This was delivered using a 3-D conformal technique. The pt received a boost  delivering an additional 10 Gy in 5 fractions using a electron boost with electrons. The total dose was 60.4 Gy.     CHIEF COMPLIANT:  Surveillance of breast cancer  HISTORY OF PRESENT ILLNESS: History of Present Illness Heather Hebert is a 63 year old female with breast cancer who presents for routine follow-up.  She is in her fourth year of being clear from breast cancer and experiences no pain or discomfort in the chest area. Occasionally, she feels a sense of fullness, which she manages with drainage exercises. She performs regular self-examinations and keeps up with mammograms.     ALLERGIES:  is allergic to gramineae pollens.  MEDICATIONS:  Current Outpatient Medications  Medication Sig Dispense Refill   Calcium  Carb-Cholecalciferol (CALCIUM  600 + D PO) Take 1 tablet by mouth daily.     cetirizine (ZYRTEC) 10 MG tablet Take 10 mg by mouth daily.     co-enzyme Q-10 30 MG capsule Take 30 mg by mouth 3 (three) times daily.     levothyroxine  (SYNTHROID ) 50 MCG tablet Take 50 mcg by mouth daily before breakfast.     Olopatadine HCl (PATADAY OP) Place 2 drops into both eyes daily as needed (dry eyes).      Probiotic Product (PROBIOTIC PO) Take 1 tablet by mouth daily.      rosuvastatin  (CRESTOR ) 10 MG tablet Take 1 tablet (10 mg total) by mouth daily.     No current facility-administered medications for this visit.    PHYSICAL EXAMINATION: ECOG PERFORMANCE STATUS: 1 - Symptomatic but completely ambulatory  Vitals:   09/13/23 1015  BP: 139/70  Pulse: 79  Resp: 18  Temp: 98 F (36.7 C)  SpO2: 100%   Filed Weights   09/13/23 1015  Weight: 133 lb 12.8 oz (60.7 kg)    Physical Exam BREAST: Breasts normal on examination.  (exam performed in the presence of a chaperone)  LABORATORY DATA:  I have reviewed the data as listed    Latest Ref Rng & Units 05/26/2019   10:00 AM 05/19/2019   10:05 AM 05/12/2019   12:45 PM  CMP  Glucose 70 - 99 mg/dL 868  93  890   BUN 6 - 20 mg/dL 11  8  9    Creatinine 0.44 - 1.00 mg/dL 9.27  9.30  9.32   Sodium 135 - 145 mmol/L 141  133  140   Potassium 3.5 - 5.1 mmol/L 3.7   3.9  3.9   Chloride 98 - 111 mmol/L 104  101  104   CO2 22 - 32 mmol/L 23  25  26    Calcium  8.9 - 10.3 mg/dL 9.2  9.3  9.3   Total Protein 6.5 - 8.1 g/dL 6.8  6.7  6.7   Total Bilirubin 0.3 - 1.2 mg/dL <9.7  0.3  0.2   Alkaline Phos 38 - 126 U/L 100  95  100   AST 15 - 41 U/L 27  19  23    ALT 0 - 44 U/L 23  14  14      Lab Results  Component Value Date   WBC 4.5 06/30/2019   HGB 12.5 06/30/2019   HCT 39.3 06/30/2019   MCV 99.2 06/30/2019   PLT 167 06/30/2019   NEUTROABS 1.8 05/26/2019    ASSESSMENT & PLAN:  Malignant neoplasm of lower-outer quadrant of left breast of female, estrogen receptor negative (HCC) 12/07/2018:Patient palpated a lump in the left breast. Mammogram showed a 1.4cm mass at the 5 o'clock position  in the left breast, a left axillary lymph node measuring 2.8cm, and 2 smaller axillary lymph nodes with cortical thickening. 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 Stage IIb   Treatment plan: 1. Neoadjuvant chemotherapy with Adriamycin  and Cytoxan  dose dense 4 followed by Taxol  weekly 12 with carboplatin  every 3 weeks--carbo eliminated due to cytopenias completed 05/26/2019 2. Left lumpectomy [facility]): no residual carcinoma and 13 left axillary lymph nodes negative for carcinoma. 3. Followed by adjuvant radiation therapy completed 09/26/19   Breast MRI on 12/21/18 showed the known 1.5cm mass in the left breast, at least 6 abnormal left axillary lymph nodes, and no right breast malignancy -------------------------------------------------------------------------------------------------------------------------------------------------- 07/04/2019:Left lumpectomy [facility]): no residual carcinoma and 13 left axillary lymph nodes negative for carcinoma.   Treatment plan: surveillance She has moved to [place] .  She still notes come to [facility] for her checkups. She would like to continue with the same.    Breast Cancer Surveillance: 1. Breast Exam: 09/13/2023: Benign 2. Mammogram: 08/31/2023: Benign Density Cat C We discussed the role of guardant reveal for MRD testing.  She will read about it and will let us  know.    RTC in 1 year and after that she could be seen on an as-needed basis.     No orders of the defined types were placed in this encounter.  The patient has a good understanding of the overall plan. she agrees with it. she will call with any problems that may develop before the next visit here. Total time spent: 30 mins including face to face time and time spent for planning, charting and co-ordination of care   Naomi MARLA Chad, MD 09/13/23

## 2023-09-13 NOTE — Assessment & Plan Note (Signed)
 12/07/2018:Patient palpated a lump in the left breast. Mammogram showed a 1.4cm mass at the 5 o'clock position in the left breast, a left axillary lymph node measuring 2.8cm, and 2 smaller axillary lymph nodes with cortical thickening. 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 Stage IIb   Treatment plan: 1. Neoadjuvant chemotherapy with Adriamycin  and Cytoxan  dose dense 4 followed by Taxol  weekly 12 with carboplatin  every 3 weeks--carbo eliminated due to cytopenias completed 05/26/2019 2. Left lumpectomy [facility]): no residual carcinoma and 13 left axillary lymph nodes negative for carcinoma. 3. Followed by adjuvant radiation therapy completed 09/26/19   Breast MRI on 12/21/18 showed the known 1.5cm mass in the left breast, at least 6 abnormal left axillary lymph nodes, and no right breast malignancy -------------------------------------------------------------------------------------------------------------------------------------------------- 07/04/2019:Left lumpectomy [facility]): no residual carcinoma and 13 left axillary lymph nodes negative for carcinoma.   Treatment plan: surveillance She has moved to [place] .  She still notes come to [facility] for her checkups. She would like to continue with the same.   Breast Cancer Surveillance: 1. Breast Exam: 09/13/2023: Benign 2. Mammogram: 08/31/2023: Benign Density Cat C   She is going on a train ride in [place] along with her sister for 2 weeks.    RTC in 1 year

## 2023-09-14 ENCOUNTER — Ambulatory Visit: Payer: Commercial Managed Care - PPO | Admitting: Hematology and Oncology

## 2023-09-16 ENCOUNTER — Encounter: Payer: Self-pay | Admitting: Hematology and Oncology

## 2023-09-20 ENCOUNTER — Telehealth: Payer: Self-pay

## 2023-09-20 NOTE — Telephone Encounter (Signed)
 Per md orders entered for Guardant Reveal and all supported documents faxed to [phone]. Faxed confirmation was received.

## 2023-10-12 ENCOUNTER — Telehealth: Payer: Self-pay | Admitting: *Deleted

## 2023-10-12 ENCOUNTER — Encounter: Payer: Self-pay | Admitting: Hematology and Oncology

## 2023-10-12 NOTE — Telephone Encounter (Signed)
 Per MD request RN placed call to pt with recent Guardant Reveal results being negative.  No answer, LVM for pt to return call to the office.

## 2024-09-11 ENCOUNTER — Ambulatory Visit: Admitting: Hematology and Oncology

## 2024-09-12 ENCOUNTER — Ambulatory Visit: Admitting: Hematology and Oncology
